# Patient Record
Sex: Male | Born: 1975 | Race: White | Hispanic: No | Marital: Married | Smoking: Never smoker
Health system: Southern US, Community
[De-identification: ages and names within clinical notes are randomized; demographics above are authoritative.]

## PROBLEM LIST (undated history)

## (undated) DIAGNOSIS — K2270 Barrett's esophagus without dysplasia: Secondary | ICD-10-CM

## (undated) DIAGNOSIS — T7840XA Allergy, unspecified, initial encounter: Secondary | ICD-10-CM

## (undated) DIAGNOSIS — C20 Malignant neoplasm of rectum: Secondary | ICD-10-CM

## (undated) HISTORY — DX: Barrett's esophagus without dysplasia: K22.70

## (undated) HISTORY — DX: Allergy, unspecified, initial encounter: T78.40XA

## (undated) HISTORY — PX: COLONOSCOPY WITH ESOPHAGOGASTRODUODENOSCOPY (EGD): SHX5779

---

## 2011-08-10 HISTORY — PX: FINGER SURGERY: SHX640

## 2016-09-27 ENCOUNTER — Encounter (HOSPITAL_COMMUNITY): Payer: Self-pay | Admitting: Emergency Medicine

## 2016-09-27 ENCOUNTER — Ambulatory Visit (HOSPITAL_COMMUNITY)
Admission: EM | Admit: 2016-09-27 | Discharge: 2016-09-27 | Disposition: A | Discharge status: Home / Self Care | Payer: PRIVATE HEALTH INSURANCE | Attending: Internal Medicine | Admitting: Internal Medicine

## 2016-09-27 ENCOUNTER — Ambulatory Visit (INDEPENDENT_AMBULATORY_CARE_PROVIDER_SITE_OTHER): Payer: PRIVATE HEALTH INSURANCE

## 2016-09-27 DIAGNOSIS — M1712 Unilateral primary osteoarthritis, left knee: Secondary | ICD-10-CM

## 2016-09-27 NOTE — ED Provider Notes (Signed)
[place]    CSN: [id number] Arrival date & time: 09/27/16  1018     History   Chief Complaint Chief Complaint  Patient presents with  . Knee Pain    HPI Jeffrey Day. is a 41 y.o. male. He is a heavy Emergency planning/management officer, and plays recreational softball. He presents today with a gradual onset over several weeks of left knee discomfort. He has some discomfort with activity, and actually stopped playing softball in November in an effort to let the knee rest. He also has stiffness if he's been sitting still, on the couch or driving, takes a few minutes to limber up. No fever, no rashes, no malaise. No other joint difficulties. 2-3 days a week he takes 4 tablets of Advil over-the-counter in the morning, seems to help some. A knee sleeve has not been helpful. He has not had this symptom in the past. He has no family history of arthritis. He moved here about 18 months ago, and his platelet a lot more softball since then, most weekends was playing 3-8 games until a couple months ago.    HPI  History reviewed. No pertinent past medical history.   Past Surgical History:  Procedure Laterality Date  . FINGER SURGERY         Home Medications   advil  Family History History reviewed. No pertinent family history.  Social History Social History  Substance Use Topics  . Smoking status: Never Smoker  . Smokeless tobacco: Never Used  . Alcohol use Yes     Allergies   Penicillins   Review of Systems Review of Systems  All other systems reviewed and are negative.    Physical Exam Triage Vital Signs ED Triage Vitals [09/27/16 1104]  Enc Vitals Group     BP 137/79     Pulse Rate 95     Resp 16     Temp 98.6 F (37 C)     Temp Source Oral     SpO2 99 %     Weight      Height      Pain Score 4   Updated Vital Signs BP 137/79 (BP Location: Right Arm)   Pulse 95   Temp 98.6 F (37 C) (Oral)   Resp 16   SpO2 99%   Physical Exam    Constitutional: He is oriented to person, place, and time. No distress.  Alert, nicely groomed  HENT:  Head: Atraumatic.  Eyes:  Conjugate gaze, no eye redness/drainage  Neck: Neck supple.  Cardiovascular: Normal rate.   Pulmonary/Chest: No respiratory distress.  Lungs clear, symmetric breath sounds  Abdominal: He exhibits no distension.  Musculoskeletal: Normal range of motion.  Both knees are examined side-by-side, roughly symmetric but may be a faint suggestion of swelling in the upper medial aspect of the left knee compared to the right. Symmetric range of motion, able to fully flex and fully extend the left knee. No focal tenderness to palpation, no temperature difference. No effusion noted. No erythema or bruising  Neurological: He is alert and oriented to person, place, and time.  Skin: Skin is warm and dry.  No cyanosis  Nursing note and vitals reviewed.    UC Treatments / Results   Radiology Dg Knee Ap/lat W/sunrise Left  Result Date: 09/27/2016 CLINICAL DATA:  Left knee pain for 1 month.  No known injury. EXAM: LEFT KNEE 3 VIEWS COMPARISON:  None. FINDINGS: No evidence of fracture, dislocation, or joint effusion. Enthesopathic  changes are seen along the superior margin of patella. No other osseous abnormality identified. IMPRESSION: No acute findings.  Patellar enthesopathy noted. Electronically Signed   By: Earle Gell M.D.   On: 09/27/2016 12:52    Procedures Procedures (including critical care time) None today   Final Clinical Impressions(s) / UC Diagnoses   Final diagnoses:  Osteoarthritis of left knee, unspecified osteoarthritis type   Avoid activities that increase knee pain/stiffness dramatically.  Continue ibuprofen as you are.  Followup with orthopedics.  Xray just suggested some mild wear and tear changes.    Sherlene Shams, MD 09/28/16 [phone]

## 2016-09-27 NOTE — Discharge Instructions (Addendum)
Avoid activities that increase knee pain/stiffness dramatically.  Continue ibuprofen as you are.  Followup with orthopedics.  Xray just suggested some mild wear and tear changes.

## 2016-09-27 NOTE — ED Triage Notes (Signed)
The patient presented to the [facility] with a complaint of left knee pain x 2 months. The patient did not recall a specific injury but did advised that he has a lot of "wear and tear" playing softball.

## 2016-10-07 ENCOUNTER — Other Ambulatory Visit: Payer: Self-pay | Admitting: Orthopedic Surgery

## 2016-10-07 DIAGNOSIS — R531 Weakness: Secondary | ICD-10-CM

## 2016-10-07 DIAGNOSIS — R52 Pain, unspecified: Secondary | ICD-10-CM

## 2016-10-07 DIAGNOSIS — Z77018 Contact with and (suspected) exposure to other hazardous metals: Secondary | ICD-10-CM

## 2016-10-08 ENCOUNTER — Ambulatory Visit
Admission: RE | Admit: 2016-10-08 | Discharge: 2016-10-08 | Disposition: A | Discharge status: Home / Self Care | Payer: PRIVATE HEALTH INSURANCE | Source: Ambulatory Visit | Attending: Orthopedic Surgery | Admitting: Orthopedic Surgery

## 2016-10-08 DIAGNOSIS — Z77018 Contact with and (suspected) exposure to other hazardous metals: Secondary | ICD-10-CM

## 2016-10-08 DIAGNOSIS — R52 Pain, unspecified: Secondary | ICD-10-CM

## 2016-10-08 DIAGNOSIS — R531 Weakness: Secondary | ICD-10-CM

## 2017-06-03 ENCOUNTER — Ambulatory Visit: Payer: Self-pay | Admitting: Family Medicine

## 2017-09-26 ENCOUNTER — Ambulatory Visit: Payer: Self-pay | Admitting: Family Medicine

## 2018-12-28 ENCOUNTER — Encounter: Payer: Self-pay | Admitting: Family Medicine

## 2018-12-28 ENCOUNTER — Ambulatory Visit (INDEPENDENT_AMBULATORY_CARE_PROVIDER_SITE_OTHER): Payer: BLUE CROSS/BLUE SHIELD | Admitting: Family Medicine

## 2018-12-28 DIAGNOSIS — K5900 Constipation, unspecified: Secondary | ICD-10-CM | POA: Diagnosis not present

## 2018-12-28 NOTE — Progress Notes (Signed)
Chief Complaint:  Jeffrey Day. is a 43 y.o. male who presents today for a virtual office visit with a chief complaint of constipation.   Assessment/Plan:  Constipation No red flags.  Will start bowel regimen with MiraLAX and senna for the next 1-2 weeks.  Afterwards, will continue MiraLAX daily for several more weeks, then as needed daily to have soft bowel movement.  If does not prove with above, would consider Amitza versus Linzess.  Discussed reasons to return to care.  Follow-up as needed.  Preventative Healthcare Patient was instructed to return soon for CPE. Health Maintenance Due  Topic Date Due  . HIV Screening  05[birth year]1990     Subjective:  HPI:  Constipation Started several months ago.  Worsened within the last few weeks.  Has sensation that he needs to go to the restroom however when he has a bowel movement he will he has a small amount of loose stool.  Has to strain to have a bowel movement.  No nausea or vomiting.  No fevers or chills.  Has tried ducolax which helps however only last for a day or so.  Has had some decreased appetite.  No melena or hematochezia.  No abdominal pain.  No other obvious alleviating or aggravating factors.   ROS: Per HPI, otherwise a complete review of systems was negative.   PMH:  The following were reviewed and entered/updated in epic: History reviewed. No pertinent past medical history. There are no active problems to display for this patient.  Past Surgical History:  Procedure Laterality Date  . 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 Lung cancer Paternal Grandmother   . Hypertension Neg Hx   . Heart disease Neg Hx   . Stroke Neg Hx     Medications- reviewed and updated No current outpatient medications on file.   No current facility-administered medications for this visit.     Allergies-reviewed and updated Allergies  Allergen Reactions  .  Penicillins Other (See Comments)    Social History   Socioeconomic History  . Marital status: Married    Spouse name: Not on file  . Number of children: Not on file  . Years of education: Not on file  . Highest education level: Not on file  Occupational History  . Not on file  Social Needs  . Financial resource strain: Not on file  . Food insecurity:    Worry: Not on file    Inability: Not on file  . Transportation needs:    Medical: Not on file    Non-medical: Not on file  Tobacco Use  . Smoking status: Never Smoker  . Smokeless tobacco: Never Used  Substance and Sexual Activity  . Alcohol use: Yes  . Drug use: No  . Sexual activity: Not on file  Lifestyle  . Physical activity:    Days per week: Not on file    Minutes per session: Not on file  . Stress: Not on file  Relationships  . Social connections:    Talks on phone: Not on file    Gets together: Not on file    Attends religious service: Not on file    Active member of club or organization: Not on file    Attends meetings of clubs or organizations: Not on file    Relationship status: Not on file  Other Topics Concern  . Not on file  Social History Narrative  . Not on file         Objective/Observations  Physical Exam: Gen: NAD, resting comfortably Eyes: EOMI. no scleral icterus Cardiovascular: No peripheral edema Pulm: Normal work of breathing Skin: No rashes or lesions MSK: Upper extremities with full range of motion.  No digital cyanosis. Neuro: Grossly normal, moves all extremities Psych: Normal affect and thought content  Virtual Visit via Video   I connected with Michaell Cowing. on 12/28/18 at  3:40 PM EDT by a video enabled telemedicine application and verified that I am speaking with the correct person using two identifiers. I discussed the limitations of evaluation and management by telemedicine and the availability of in person appointments. The patient expressed understanding and  agreed to proceed.   Patient location: Home Provider location: [place] participating in the virtual visit: Myself and Patient     Algis Greenhouse. Jerline Pain, MD 12/28/2018 4:06 PM

## 2019-01-18 ENCOUNTER — Ambulatory Visit (INDEPENDENT_AMBULATORY_CARE_PROVIDER_SITE_OTHER): Payer: BC Managed Care – PPO | Admitting: Family Medicine

## 2019-01-18 ENCOUNTER — Encounter: Payer: Self-pay | Admitting: Family Medicine

## 2019-01-18 VITALS — Wt 180.0 lb

## 2019-01-18 DIAGNOSIS — K625 Hemorrhage of anus and rectum: Secondary | ICD-10-CM

## 2019-01-18 DIAGNOSIS — K5900 Constipation, unspecified: Secondary | ICD-10-CM

## 2019-01-18 NOTE — Progress Notes (Signed)
    Chief Complaint:  Jeffrey Eisen. is a 43 y.o. male who presents today for a virtual office visit with a chief complaint of abdominal pain.   Assessment/Plan:  Abdominal pain/constipation/rectal bleeding Symptoms have not improved with MiraLAX.  Given his degree of abdominal pain as well as rectal bleeding, will place referral to GI for further evaluation.  He will continue using MiraLAX in the meantime. Discussed reasons to return to care and seek emergent care.     Subjective:  HPI:  Constipation / Abdominal Pain Patient had virtual visit for this about 2 weeks ago. He was recommended to start a bowel regimen with miralax and senna.  Symptoms improved for a few days however started having worsening pain with the senna and then stopped taking it.  He has been continuing with MiraLAX and overall symptoms have been stable however a few days ago had significantly worsening lower abdominal pain that lasted pretty much all day.  He has also had several episodes of bright red blood per rectum over that time as well.  He will have the sudden urge to have a bowel movement however when he goes to the restroom only a small amount comes out.  ROS: Per HPI  PMH: He reports that he has never smoked. He has never used smokeless tobacco. He reports current alcohol use. He reports that he does not use drugs.      Objective/Observations  Physical Exam: Gen: NAD, resting comfortably Pulm: Normal work of breathing Neuro: Grossly normal, moves all extremities Psych: Normal affect and thought content  Virtual Visit via Video   I connected with Jeffrey Day. on 01/18/19 at  3:40 PM EDT by a video enabled telemedicine application and verified that I am speaking with the correct person using two identifiers. I discussed the limitations of evaluation and management by telemedicine and the availability of in person appointments. The patient expressed understanding and agreed to  proceed.   Patient location: Home Provider location: [place] participating in the virtual visit: Myself and Patient     Jeffrey Day. Jerline Pain, MD 01/18/2019 3:36 PM

## 2019-01-19 ENCOUNTER — Encounter: Payer: Self-pay | Admitting: Gastroenterology

## 2019-02-02 ENCOUNTER — Encounter: Payer: Self-pay | Admitting: Gastroenterology

## 2019-02-02 ENCOUNTER — Ambulatory Visit (INDEPENDENT_AMBULATORY_CARE_PROVIDER_SITE_OTHER): Payer: BC Managed Care – PPO | Admitting: Gastroenterology

## 2019-02-02 ENCOUNTER — Telehealth: Payer: Self-pay | Admitting: Gastroenterology

## 2019-02-02 DIAGNOSIS — R1032 Left lower quadrant pain: Secondary | ICD-10-CM | POA: Diagnosis not present

## 2019-02-02 DIAGNOSIS — R194 Change in bowel habit: Secondary | ICD-10-CM

## 2019-02-02 DIAGNOSIS — K5900 Constipation, unspecified: Secondary | ICD-10-CM

## 2019-02-02 DIAGNOSIS — R630 Anorexia: Secondary | ICD-10-CM

## 2019-02-02 DIAGNOSIS — R634 Abnormal weight loss: Secondary | ICD-10-CM

## 2019-02-02 DIAGNOSIS — K625 Hemorrhage of anus and rectum: Secondary | ICD-10-CM

## 2019-02-02 DIAGNOSIS — R6881 Early satiety: Secondary | ICD-10-CM

## 2019-02-02 MED ORDER — PEG 3350-KCL-NA BICARB-NACL 420 G PO SOLR: 4000.0000 mL | Freq: Once | ORAL | 0 refills | Status: AC

## 2019-02-02 NOTE — Progress Notes (Signed)
[facility] VISIT   Primary Care Provider Vivi Barrack, [address] [place] [address] [phone]  Referring Provider Vivi Barrack, [address] [place],  [place] [address] [phone]  Patient Profile: Jeffrey Day. is a 43 y.o. male without a significant PMH.  The patient presents to the [facility] for an evaluation and management of problem(s) noted below:  Problem List 1. Rectal bleeding   2. LLQ pain   3. Unintentional weight loss   4. Constipation, unspecified constipation type   5. Change in bowel habits   6. Early satiety   7. Anorexia     I connected with  Michaell Cowing. on 02/02/19. I verified that I was speaking with the correct person using two identifiers. Due to the COVID-19 Pandemic, this service was provided via telemedicine using [employer]. The patient was located at home. The provider was located in the office. The patient did consent to this visit and is aware of charges through their insurance as well as the limitations of evaluation and management by telemedicine. The patient was referred by PCP. Other persons participating in this telemedicine service were none. Time spent on visit was greater than 45 minutes   History of Present Illness This is the patient's first visit to the outpatient of our GI clinic.  He describes over the course of the last year that he has been having on/off GI symptoms.  Over the course the last year he is noted a change in the caliber of his stools to become smaller in diameter.  Over the last 3 to 4 months he has noted bleeding per rectum.  This does not occur on a daily basis but is becoming an issue as he is now developing rectal discomfort and at times fecal urgency.  He has tried MiraLAX and senna and began to have issues of abdominal discomfort with the senna and so has stayed on MiraLAX once daily.  He has discomfort in the  left lower quadrant and rectum.  The last 2 months of this has been much more of a difficult time period for him due to the pain.  The patient has had to wake up in the middle the night as a result of the pain in his rectum.  Unfortunately over the course the last 6 months he has lost 30 pounds unintentionally.  He has had decreased appetite as well as early satiety.  He denies any pyrosis or dysphagia.  He has no nausea or vomiting.  He denies any fevers or chills.  The patient has not had a prior endoscopy or colonoscopy.  The patient does not normally follow-up with a provider unless there are significant issues and he is very concerned at this point.  GI Review of Systems Positive as above Negative for odynophagia, coffee-ground emesis, hematemesis, melena  Review of Systems General: Denies fevers/chills HEENT: Denies oral lesions Cardiovascular: Denies chest pain Pulmonary: Denies shortness of breath Gastroenterological: See HPI Genitourinary: Denies darkened urine or hematuria Hematological: Denies easy bruising/bleeding Endocrine: Denies temperature intolerance Dermatological: Denies jaundice Psychological: Mood is stable   Medications Current Outpatient Medications  Medication Sig Dispense Refill   Polyethylene Glycol 3350 (MIRALAX PO) Take 17 g by mouth daily.     No current facility-administered medications for this visit.     Allergies Allergies  Allergen Reactions   Penicillins Other (See Comments)    Histories History reviewed. No pertinent past medical history. Past Surgical History:  Procedure Laterality Date   FINGER SURGERY  2013   Traumatic Amputation    Social History   Socioeconomic History   Marital status: Married    Spouse name: Not on file   Number of children: Not on file   Years of education: Not on file   Highest education level: Not on file  Occupational History   Not on file  Social Needs   Financial resource strain: Not on file    Food insecurity    Worry: Not on file    Inability: Not on file   Transportation needs    Medical: Not on file    Non-medical: Not on file  Tobacco Use   Smoking status: Never Smoker   Smokeless tobacco: Never Used  Substance and Sexual Activity   Alcohol use: Yes   Drug use: No   Sexual activity: Not on file  Lifestyle   Physical activity    Days per week: Not on file    Minutes per session: Not on file   Stress: Not on file  Relationships   Social connections    Talks on phone: Not on file    Gets together: Not on file    Attends religious service: Not on file    Active member of club or organization: Not on file    Attends meetings of clubs or organizations: Not on file    Relationship status: Not on file   Intimate partner violence    Fear of current or ex partner: Not on file    Emotionally abused: Not on file    Physically abused: Not on file    Forced sexual activity: Not on file  Other Topics Concern   Not on file  Social History Narrative   Not on file   Family History  Problem Relation Age of Onset   Lung cancer Paternal Grandmother    Hypertension Neg Hx    Heart disease Neg Hx    Stroke Neg Hx    Colon cancer Neg Hx    Esophageal cancer Neg Hx    Colitis Neg Hx    Inflammatory bowel disease Neg Hx    Liver disease Neg Hx    Pancreatic cancer Neg Hx    Rectal cancer Neg Hx    Stomach cancer Neg Hx    I have reviewed his medical, social, and family history in detail and updated the electronic medical record as necessary.    PHYSICAL EXAMINATION  Telehealth Visit   REVIEW OF DATA  I reviewed the following data at the time of this encounter:  GI Procedures and Studies  No relevant studies to review  Laboratory Studies  Reviewed those in EPIC  Imaging Studies  No relevant imaging studies to review   ASSESSMENT  Mr. Busche is a 43 y.o. male without a significant PMH.  The patient is seen today for evaluation  and management of:  1. Rectal bleeding   2. LLQ pain   3. Unintentional weight loss   4. Constipation, unspecified constipation type   5. Change in bowel habits   6. Early satiety   7. Anorexia    The patient is hemodynamically stable.  However, the patient's rectal bleeding, unintentional weight loss, change in bowel habits, other systemic symptoms do concern me about the possibility of an underlying malignant process.  The patient is also concerned.  We are going to focus on MiraLAX usage at this point in time until we get him to his colonoscopy.  He will benefit from an upper endoscopy evaluation as well.  If his upper and lower endoscopic evaluations are unremarkable, then in the setting of his unintentional weight loss then we will need to perform a cross-sectional imaging of his CT chest/abdomen/pelvis.  The risks and benefits of endoscopic evaluation were discussed with the patient; these include but are not limited to the risk of perforation, infection, bleeding, missed lesions, lack of diagnosis, severe illness requiring hospitalization, as well as anesthesia and sedation related illnesses.  The patient is agreeable to proceed.  All patient questions were answered, to the best of my ability, and the patient agrees to the aforementioned plan of action with follow-up as indicated.    PLAN  Laboratories as outlined below Diagnostic colonoscopy/endoscopy Consider cross-sectional CT chest abdomen pelvis for further work-up/evaluation of unintentional weight loss if negative endoscopic evaluation Based on laboratories if patient is iron deficient he will require IV iron and oral iron supplementation Increase H2O intake to 80 to 100 ounces per day MiraLAX 1-2 times daily If no bowel movement or still having issues of constipation may use milk of magnesia or may use bisacodyl   Orders Placed This Encounter  Procedures   CBC   Comp Met (CMET)   Lipase   TSH   IBC + Ferritin    Calcium, ionized   Ambulatory referral to Gastroenterology    New Prescriptions   No medications on file   Modified Medications   No medications on file    Planned Follow Up No follow-ups on file.   Justice Britain, MD [facility] Advanced [facility]oscopy Office # [phone]

## 2019-02-02 NOTE — Patient Instructions (Addendum)
Increase Miralax to twice daily as needed.   Start Fibercon once daily.   Your provider has requested that you go to the basement level for lab work before leaving today. Press "B" on the elevator. The lab is located at the first door on the left as you exit the elevator.  You have been scheduled for an endoscopy and colonoscopy. Please follow the written instructions given to you at your visit today. Please pick up your prep supplies at the pharmacy within the next 1-3 days. If you use inhalers (even only as needed), please bring them with you on the day of your procedure. Your physician has requested that you go to www.startemmi.com and enter the access code given to you at your visit today. This web site gives a general overview about your procedure. However, you should still follow specific instructions given to you by our office regarding your preparation for the procedure.  Thank you for choosing me and [facility].  Dr. Rush Landmark

## 2019-02-02 NOTE — Telephone Encounter (Signed)
Patient called in wanting to know if his lab orders can be sent to a [facility]? Please call and advise thanks

## 2019-02-03 DIAGNOSIS — R1032 Left lower quadrant pain: Secondary | ICD-10-CM | POA: Insufficient documentation

## 2019-02-03 DIAGNOSIS — R194 Change in bowel habit: Secondary | ICD-10-CM | POA: Insufficient documentation

## 2019-02-03 DIAGNOSIS — R634 Abnormal weight loss: Secondary | ICD-10-CM | POA: Insufficient documentation

## 2019-02-03 DIAGNOSIS — K625 Hemorrhage of anus and rectum: Secondary | ICD-10-CM | POA: Insufficient documentation

## 2019-02-03 DIAGNOSIS — R630 Anorexia: Secondary | ICD-10-CM | POA: Insufficient documentation

## 2019-02-03 DIAGNOSIS — K5900 Constipation, unspecified: Secondary | ICD-10-CM | POA: Insufficient documentation

## 2019-02-03 DIAGNOSIS — R6881 Early satiety: Secondary | ICD-10-CM | POA: Insufficient documentation

## 2019-02-05 NOTE — Telephone Encounter (Signed)
Pt advised that labs need to be done at our lab.

## 2019-02-12 ENCOUNTER — Other Ambulatory Visit (INDEPENDENT_AMBULATORY_CARE_PROVIDER_SITE_OTHER): Payer: BC Managed Care – PPO

## 2019-02-12 DIAGNOSIS — R6881 Early satiety: Secondary | ICD-10-CM

## 2019-02-12 DIAGNOSIS — R1032 Left lower quadrant pain: Secondary | ICD-10-CM

## 2019-02-12 DIAGNOSIS — R194 Change in bowel habit: Secondary | ICD-10-CM

## 2019-02-12 DIAGNOSIS — R634 Abnormal weight loss: Secondary | ICD-10-CM

## 2019-02-12 DIAGNOSIS — K625 Hemorrhage of anus and rectum: Secondary | ICD-10-CM

## 2019-02-12 DIAGNOSIS — R630 Anorexia: Secondary | ICD-10-CM

## 2019-02-12 DIAGNOSIS — K5900 Constipation, unspecified: Secondary | ICD-10-CM | POA: Diagnosis not present

## 2019-02-12 LAB — CBC
HCT: 43.1 % (ref 39.0–52.0)
Hemoglobin: 14.7 g/dL (ref 13.0–17.0)
MCHC: 34.2 g/dL (ref 30.0–36.0)
MCV: 87.3 fl (ref 78.0–100.0)
Platelets: 304 10*3/uL (ref 150.0–400.0)
RBC: 4.93 Mil/uL (ref 4.22–5.81)
RDW: 13.6 % (ref 11.5–15.5)
WBC: 6.3 10*3/uL (ref 4.0–10.5)

## 2019-02-12 LAB — COMPREHENSIVE METABOLIC PANEL
ALT: 10 U/L (ref 0–53)
AST: 10 U/L (ref 0–37)
Albumin: 4.4 g/dL (ref 3.5–5.2)
Alkaline Phosphatase: 93 U/L (ref 39–117)
BUN: 10 mg/dL (ref 6–23)
CO2: 29 mEq/L (ref 19–32)
Calcium: 9.7 mg/dL (ref 8.4–10.5)
Chloride: 100 mEq/L (ref 96–112)
Creatinine, Ser: 0.94 mg/dL (ref 0.40–1.50)
GFR: 87.29 mL/min (ref >60.00)
Glucose, Bld: 107 mg/dL — ABNORMAL HIGH (ref 70–99)
Potassium: 4 mEq/L (ref 3.5–5.1)
Sodium: 139 mEq/L (ref 135–145)
Total Bilirubin: 0.4 mg/dL (ref 0.2–1.2)
Total Protein: 7.5 g/dL (ref 6.0–8.3)

## 2019-02-12 LAB — IBC + FERRITIN
Ferritin: 90.6 ng/mL (ref 22.0–322.0)
Iron: 82 ug/dL (ref 42–165)
Saturation Ratios: 22.8 % (ref 20.0–50.0)
Transferrin: 257 mg/dL (ref 212.0–360.0)

## 2019-02-12 LAB — TSH: TSH: 1.4 u[IU]/mL (ref 0.35–4.50)

## 2019-02-12 LAB — LIPASE: Lipase: 18 U/L (ref 11.0–59.0)

## 2019-02-13 LAB — CALCIUM, IONIZED: Calcium, Ion: 5.4 mg/dL (ref 4.8–5.6)

## 2019-02-14 ENCOUNTER — Telehealth: Payer: Self-pay | Admitting: Gastroenterology

## 2019-02-14 NOTE — Telephone Encounter (Signed)

## 2019-02-14 NOTE — Telephone Encounter (Signed)
Pt responded "no" to all screening questions °

## 2019-02-15 ENCOUNTER — Other Ambulatory Visit: Payer: Self-pay

## 2019-02-15 ENCOUNTER — Ambulatory Visit (AMBULATORY_SURGERY_CENTER): Payer: BC Managed Care – PPO | Admitting: Gastroenterology

## 2019-02-15 ENCOUNTER — Encounter: Payer: Self-pay | Admitting: Gastroenterology

## 2019-02-15 VITALS — BP 126/77 | HR 72 | Temp 98.9°F | Resp 16 | Ht 68.0 in | Wt 180.0 lb

## 2019-02-15 DIAGNOSIS — R1032 Left lower quadrant pain: Secondary | ICD-10-CM

## 2019-02-15 DIAGNOSIS — K6289 Other specified diseases of anus and rectum: Secondary | ICD-10-CM

## 2019-02-15 DIAGNOSIS — K625 Hemorrhage of anus and rectum: Secondary | ICD-10-CM | POA: Diagnosis not present

## 2019-02-15 DIAGNOSIS — K449 Diaphragmatic hernia without obstruction or gangrene: Secondary | ICD-10-CM | POA: Diagnosis not present

## 2019-02-15 DIAGNOSIS — K2950 Unspecified chronic gastritis without bleeding: Secondary | ICD-10-CM | POA: Diagnosis not present

## 2019-02-15 DIAGNOSIS — K2270 Barrett's esophagus without dysplasia: Secondary | ICD-10-CM | POA: Diagnosis not present

## 2019-02-15 DIAGNOSIS — K210 Gastro-esophageal reflux disease with esophagitis: Secondary | ICD-10-CM | POA: Diagnosis not present

## 2019-02-15 DIAGNOSIS — K5730 Diverticulosis of large intestine without perforation or abscess without bleeding: Secondary | ICD-10-CM

## 2019-02-15 DIAGNOSIS — K2980 Duodenitis without bleeding: Secondary | ICD-10-CM

## 2019-02-15 DIAGNOSIS — C20 Malignant neoplasm of rectum: Secondary | ICD-10-CM

## 2019-02-15 DIAGNOSIS — K648 Other hemorrhoids: Secondary | ICD-10-CM

## 2019-02-15 DIAGNOSIS — K2970 Gastritis, unspecified, without bleeding: Secondary | ICD-10-CM | POA: Diagnosis not present

## 2019-02-15 DIAGNOSIS — K209 Esophagitis, unspecified without bleeding: Secondary | ICD-10-CM

## 2019-02-15 MED ORDER — SODIUM CHLORIDE 0.9 % IV SOLN: 500.0000 mL | Freq: Once | INTRAVENOUS | Status: DC

## 2019-02-15 MED ORDER — OMEPRAZOLE 40 MG PO CPDR: 40.0000 mg | DELAYED_RELEASE_CAPSULE | Freq: Two times a day (BID) | ORAL | 6 refills | Status: DC

## 2019-02-15 NOTE — Progress Notes (Signed)
Vs and admitting by Lorrin Goodell   covid-19 screen and temp N [place],LPN

## 2019-02-15 NOTE — Op Note (Addendum)
[facility] Patient Name: Jeffrey Day Procedure Date: 02/15/2019 3:07 PM MRN: [id number] Endoscopist: Justice Britain , MD Age: 43 Referring MD:  Date of Birth: 11-14-1975 Gender: Male Account #: [id number] Procedure:                Colonoscopy Indications:              Rectal bleeding, Change in bowel habits, Weight loss Medicines:                Monitored Anesthesia Care Procedure:                Pre-Anesthesia Assessment:                           - Prior to the procedure, a History and Physical                            was performed, and patient medications and                            allergies were reviewed. The patient's tolerance of                            previous anesthesia was also reviewed. The risks                            and benefits of the procedure and the sedation                            options and risks were discussed with the patient.                            All questions were answered, and informed consent                            was obtained. Prior Anticoagulants: The patient has                            taken no previous anticoagulant or antiplatelet                            agents. ASA Grade Assessment: II - A patient with                            mild systemic disease. After reviewing the risks                            and benefits, the patient was deemed in                            satisfactory condition to undergo the procedure.                           After obtaining informed consent, the colonoscope  was passed under direct vision. Throughout the                            procedure, the patient's blood pressure, pulse, and                            oxygen saturations were monitored continuously. The                            Colonoscope was introduced through the anus and                            advanced to the 5 cm into the ileum. The                            colonoscopy  was performed without difficulty. The                            patient tolerated the procedure. The quality of the                            bowel preparation was good. The terminal ileum,                            ileocecal valve, appendiceal orifice, and rectum                            were photographed. Scope In: 3:33:01 PM Scope Out: 3:54:33 PM Scope Withdrawal Time: 0 hours 18 minutes 40 seconds  Total Procedure Duration: 0 hours 21 minutes 32 seconds  Findings:                 Hemorrhoids were found on perianal exam.                           The digital rectal exam findings include palpable                            rectal mass.                           The terminal ileum and ileocecal valve appeared                            normal.                           Normal mucosa was found in the descending colon, at                            the splenic flexure, in the transverse colon, at                            the hepatic flexure, in the ascending colon and in  the cecum.                           Patchy areas of granular mucosa were found in the                            recto-sigmoid colon and in the sigmoid colon. This                            was biopsied with a cold forceps for histology to                            rule out chronic colitis, though anticipate it will                            be benign.                           A few small-mouthed diverticula were found in the                            sigmoid colon, descending colon and transverse                            colon.                           A frond-like/villous, fungating, infiltrative and                            ulcerated partially obstructing large mass was                            found extending from the dentate line into the                            proximal and mid rectum. The mass was partially                            circumferential (involving  two-thirds of the lumen                            circumference). The mass measured nine cm in length                            (0-9 cm from anal verge). Oozing was present.                            Biopsies were taken with a cold forceps for                            histology leading to more oozing.                           Non-bleeding non-thrombosed  external and internal                            hemorrhoids were found during perianal exam, during                            digital exam and during endoscopy. Complications:            No immediate complications. Estimated Blood Loss:     Estimated blood loss was minimal. Impression:               - Hemorrhoids found on perianal exam.                           - Palpable rectal mass found on digital rectal exam                            throughout the rectal vault.                           - The examined portion of the ileum was normal.                           - Normal mucosa in the descending colon, at the                            splenic flexure, in the transverse colon, at the                            hepatic flexure, in the ascending colon and in the                            cecum.                           - Granularity in the recto-sigmoid colon and in the                            sigmoid colon. Biopsied.                           - Diverticulosis in the sigmoid colon, in the                            descending colon and in the transverse colon.                           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                            Biopsied.                           -  Non-bleeding non-thrombosed external and internal                            hemorrhoids. Recommendation:           - The patient will be observed post-procedure,                            until all discharge criteria are met.                            - Discharge patient to home.                           - Patient has a contact number available for                            emergencies. The signs and symptoms of potential                            delayed complications were discussed with the                            patient. Return to normal activities tomorrow.                            Written discharge instructions were provided to the                            patient.                           - Low fiber diet.                           - Begin Miralax 1-2 times daily to keep stools soft.                           - Colace 200 mg daily.                           - Continue present medications.                           - Await pathology results.                           - When results return, anticipate moving forward                            with CT-Chest/Abdomen/Pelvis. 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 Can consider role of                            Rectal EUS if necessary in future though.                           - Referrals to Oncology/Radiation/Surgery to be                            placed as well after pathology results return.                           - Anticipate repeat colonoscopy in 1 year for                            surveillance.                           - The findings and recommendations were discussed                            with the patient.                           - The findings and recommendations were discussed                            with the patient's family. Justice Britain, MD 02/15/2019 4:17:07 PM

## 2019-02-15 NOTE — Progress Notes (Signed)
Called to room to assist during endoscopic procedure.  Patient ID and intended procedure confirmed with present staff. Received instructions for my participation in the procedure from the performing physician.  

## 2019-02-15 NOTE — Op Note (Signed)
[facility] Patient Name: Jeffrey Day Procedure Date: 02/15/2019 3:08 PM MRN: [id number] Endoscopist: Justice Britain , MD Age: 43 Referring MD:  Date of Birth: Mar 05, 1976 Gender: Male Account #: [id number] Procedure:                Upper GI endoscopy Indications:              Anorexia, Early satiety, Weight loss Medicines:                Monitored Anesthesia Care Procedure:                Pre-Anesthesia Assessment:                           - Prior to the procedure, a History and Physical                            was performed, and patient medications and                            allergies were reviewed. The patient's tolerance of                            previous anesthesia was also reviewed. The risks                            and benefits of the procedure and the sedation                            options and risks were discussed with the patient.                            All questions were answered, and informed consent                            was obtained. Prior Anticoagulants: The patient has                            taken no previous anticoagulant or antiplatelet                            agents. ASA Grade Assessment: II - A patient with                            mild systemic disease. After reviewing the risks                            and benefits, the patient was deemed in                            satisfactory condition to undergo the procedure.                           After obtaining informed consent, the endoscope was  passed under direct vision. Throughout the                            procedure, the patient's blood pressure, pulse, and                            oxygen saturations were monitored continuously. The                            Endoscope was introduced through the mouth, and                            advanced to the second part of duodenum. The upper                            GI endoscopy  was accomplished without difficulty.                            The patient tolerated the procedure. Scope In: Scope Out: Findings:                 No gross lesions were noted in the proximal                            esophagus and in the mid esophagus.                           LA Grade C (one or more mucosal breaks continuous                            between tops of 2 or more mucosal folds, less than                            75% circumference) esophagitis with no bleeding was                            found in the distal esophagus.                           The esophagus and gastroesophageal junction were                            examined with white light and narrow band imaging                            (NBI) from a forward view and retroflexed position.                            There were esophageal mucosal changes suspicious                            for long-segment Barrett's esophagus. These changes  involved the mucosa at the upper extent of the                            gastric folds (36 cm from the incisors) extending                            to the Z-line (31 cm from the incisors).                            Circumferential salmon-colored mucosa was present                            from 33 to 36 cm and three tongues of                            salmon-colored mucosa were present from 31 to 34                            cm. The maximum longitudinal extent of these                            esophageal mucosal changes was 6 cm in length. This                            entire region was biopsied with a cold forceps for                            histology and placed into a single jar.                           A small hiatal hernia was present.                           No gross lesions were noted in the entire examined                            stomach. Biopsies were taken with a cold forceps                            for histology  and Helicobacter pylori testing.                           No gross lesions were noted in the duodenal bulb,                            in the first portion of the duodenum and in the                            second portion of the duodenum. Biopsies were taken                            with a cold forceps for histology. Complications:  No immediate complications. Estimated Blood Loss:     Estimated blood loss was minimal. Impression:               - No gross lesions in proximal/middle esophagus. LA                            Grade C distal esophagitis.                           - Esophageal mucosal changes suspicious for                            long-segment Barrett's esophagus. Biopsied.                           - Small hiatal hernia.                           - No gross lesions in the stomach. Biopsied.                           - No gross lesions in the duodenal bulb, in the                            first portion of the duodenum and in the second                            portion of the duodenum. Biopsied. Recommendation:           - Proceed to scheduled colonoscopy.                           - Start Omeprazole 40 mg BID (Rx to be sent to                            pharmacy and with 6 refills).                           - Await pathology results.                           - Repeat upper endoscopy in 3 months to check                            healing.                           - Plan for true Barrett's biopsies if changes still                            persistent.                           - The findings and recommendations were discussed  with the patient.                           - The findings and recommendations were discussed                            with the patient's family. Justice Britain, MD 02/15/2019 4:06:09 PM

## 2019-02-15 NOTE — Progress Notes (Signed)
Pt Drowsy. VSS. To PACU, report to RN. No anesthetic complications noted.  

## 2019-02-15 NOTE — Patient Instructions (Addendum)
Please, eat a low fiber diet.  You have been given the handout.  Take colace 200 mg daily.  Begin miralax twice daily per Dr. Jerilynn Mages.  So not get constipated. Read all of the handouts given to to by your recovery room nurse.  Dr. Has ordered a CT scan for after biopsies.  You have the contrast, and the office will call you to schedule it.  YOU HAD AN ENDOSCOPIC PROCEDURE TODAY AT [facility]:   Refer to the procedure report that was given to you for any specific questions about what was found during the examination.  If the procedure report does not answer your questions, please call your gastroenterologist to clarify.  If you requested that your care partner not be given the details of your procedure findings, then the procedure report has been included in a sealed envelope for you to review at your convenience later.  YOU SHOULD EXPECT: Some feelings of bloating in the abdomen. Passage of more gas than usual.  Walking can help get rid of the air that was put into your GI tract during the procedure and reduce the bloating. If you had a lower endoscopy (such as a colonoscopy or flexible sigmoidoscopy) you may notice spotting of blood in your stool or on the toilet paper. If you underwent a bowel prep for your procedure, you may not have a normal bowel movement for a few days.  Please Note:  You might notice some irritation and congestion in your nose or some drainage.  This is from the oxygen used during your procedure.  There is no need for concern and it should clear up in a day or so.  SYMPTOMS TO REPORT IMMEDIATELY:   Following lower endoscopy (colonoscopy or flexible sigmoidoscopy):  Excessive amounts of blood in the stool  Significant tenderness or worsening of abdominal pains  Swelling of the abdomen that is new, acute  Fever of 100F or higher   Following upper endoscopy (EGD)  Vomiting of blood or coffee ground material  New chest pain or pain under the shoulder  blades  Painful or persistently difficult swallowing  New shortness of breath  Fever of 100F or higher  Black, tarry-looking stools  For urgent or emergent issues, a gastroenterologist can be reached at any hour by calling [phone].   DIET:  We do recommend a low fiber diet.  Drink plenty of fluids but you should avoid alcoholic beverages for 24 hours.  ACTIVITY:  You should plan to take it easy for the rest of today and you should NOT DRIVE or use heavy machinery until tomorrow (because of the sedation medicines used during the test).    FOLLOW UP: Our staff will call the number listed on your records 48-72 hours following your procedure to check on you and address any questions or concerns that you may have regarding the information given to you following your procedure. If we do not reach you, we will leave a message.  We will attempt to reach you two times.  During this call, we will ask if you have developed any symptoms of COVID 19. If you develop any symptoms (ie: fever, flu-like symptoms, shortness of breath, cough etc.) before then, please call [phone].  If you test positive for Covid 19 in the 2 weeks post procedure, please call and report this information to [place].    If any biopsies were taken you will be contacted by phone. The doctor will order future tests for further investigation.  Please call us at [phone] if you have not heard about the biopsies in 3 weeks.    SIGNATURES/CONFIDENTIALITY: You and/or your care partner have signed paperwork which will be entered into your electronic medical record.  These signatures attest to the fact that that the information above on your After Visit Summary has been reviewed and is understood.  Full responsibility of the confidentiality of this discharge information lies with you and/or your care-partner.YOU HAD AN ENDOSCOPIC PROCEDURE TODAY AT [facility]:   Refer to the procedure report that was given to you  for any specific questions about what was found during the examination.  If the procedure report does not answer your questions, please call your gastroenterologist to clarify.  If you requested that your care partner not be given the details of your procedure findings, then the procedure report has been included in a sealed envelope for you to review at your convenience later.  YOU SHOULD EXPECT: Some feelings of bloating in the abdomen. Passage of more gas than usual.  Walking can help get rid of the air that was put into your GI tract during the procedure and reduce the bloating. If you had a lower endoscopy (such as a colonoscopy or flexible sigmoidoscopy) you may notice spotting of blood in your stool or on the toilet paper. If you underwent a bowel prep for your procedure, you may not have a normal bowel movement for a few days.  Please Note:  You might notice some irritation and congestion in your nose or some drainage.  This is from the oxygen used during your procedure.  There is no need for concern and it should clear up in a day or so.  SYMPTOMS TO REPORT IMMEDIATELY:   Following lower endoscopy (colonoscopy or flexible sigmoidoscopy):  Excessive amounts of blood in the stool  Significant tenderness or worsening of abdominal pains  Swelling of the abdomen that is new, acute  Fever of 100F or higher   Following upper endoscopy (EGD)  Vomiting of blood or coffee ground material  New chest pain or pain under the shoulder blades  Painful or persistently difficult swallowing  New shortness of breath  Fever of 100F or higher  Black, tarry-looking stools  For urgent or emergent issues, a gastroenterologist can be reached at any hour by calling [phone].   DIET:  We do recommend a small meal at first, but then you may proceed to your regular diet.  Drink plenty of fluids but you should avoid alcoholic beverages for 24 hours.  ACTIVITY:  You should plan to take it easy for the  rest of today and you should NOT DRIVE or use heavy machinery until tomorrow (because of the sedation medicines used during the test).    FOLLOW UP: Our staff will call the number listed on your records 48-72 hours following your procedure to check on you and address any questions or concerns that you may have regarding the information given to you following your procedure. If we do not reach you, we will leave a message.  We will attempt to reach you two times.  During this call, we will ask if you have developed any symptoms of COVID 19. If you develop any symptoms (ie: fever, flu-like symptoms, shortness of breath, cough etc.) before then, please call [phone].  If you test positive for Covid 19 in the 2 weeks post procedure, please call and report this information to [place].    If any biopsies  were taken you will be contacted by phone or by letter within the next 1-3 weeks.  Please call us at [phone] if you have not heard about the biopsies in 3 weeks.    SIGNATURES/CONFIDENTIALITY: You and/or your care partner have signed paperwork which will be entered into your electronic medical record.  These signatures attest to the fact that that the information above on your After Visit Summary has been reviewed and is understood.  Full responsibility of the confidentiality of this discharge information lies with you and/or your care-partner.

## 2019-02-16 ENCOUNTER — Other Ambulatory Visit: Payer: Self-pay | Admitting: Radiation Oncology

## 2019-02-16 ENCOUNTER — Other Ambulatory Visit: Payer: Self-pay

## 2019-02-16 ENCOUNTER — Telehealth: Payer: Self-pay | Admitting: Gastroenterology

## 2019-02-16 ENCOUNTER — Telehealth: Payer: Self-pay

## 2019-02-16 DIAGNOSIS — K6289 Other specified diseases of anus and rectum: Secondary | ICD-10-CM

## 2019-02-16 DIAGNOSIS — C20 Malignant neoplasm of rectum: Secondary | ICD-10-CM

## 2019-02-16 NOTE — Telephone Encounter (Signed)
Reached out to Jeffrey Day and his wife Jeffrey Day [phone])  to introduce myself as the GI Nurse Navigator and advise that he has an appointment with [name] NP/Dr. Benay Spice on 02/28/19 @ 2:15. Discussed what the next few weeks will entail with radiation oncology and surgical appointments. Discussed available supports and team members Encouraged them to reach out with questions or concerns.

## 2019-02-16 NOTE — Telephone Encounter (Signed)
Patient scheduled for CT chest and CT abd/pelvis both with IV and PO contrast at [facility] 02/22/19 patient to arrive at 3:45pm. Called patient and let him know to be NPO 4 hrs before CT except for drinking contrast. 1st bottle to be drank at 2:00pm and 2nd bottle to be drank at 3:00pm. Also made referrals to Oncology/Radiation Oncology and [facility]. Patient aware he will be getting calls from them to set-up appts.

## 2019-02-16 NOTE — Telephone Encounter (Signed)
I called and spoke with the patient this afternoon. Pathology had been rushed from yesterday's colonoscopy. 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 This is consistent with what we were anticipating from the procedure yesterday. We will move forward with scheduling his CT chest/abdomen/pelvis with IV and oral contrast. We will hold on pelvic MRI due to metal in facial region. Referrals to oncology and surgery and radiation oncology will be expedited I will place our oncology navigator on here to help [place] with this as well. Would be happy to consider role of endoscopic ultrasound if no evidence of metastatic disease is present and the multidisciplinary service thinks this is necessary. We we will tentatively place his case for [place] discussion on 03/31/2019. Patient appreciative for the quick update and moving forward as quickly as we can with neck steps and evaluation.  Justice Britain, MD [facility] Office # [phone]

## 2019-02-18 ENCOUNTER — Encounter: Payer: Self-pay | Admitting: Gastroenterology

## 2019-02-19 ENCOUNTER — Telehealth: Payer: Self-pay | Admitting: Radiation Oncology

## 2019-02-19 ENCOUNTER — Telehealth: Payer: Self-pay

## 2019-02-19 NOTE — Telephone Encounter (Signed)
  Follow up Call-  Call back number 02/15/2019  Post procedure Call Back phone  # [phone]  Permission to leave phone message Yes     Patient questions:  Do you have a fever, pain , or abdominal swelling? No. Pain Score  0 *  Have you tolerated food without any problems? Yes.    Have you been able to return to your normal activities? Yes.    Do you have any questions about your discharge instructions: Diet   No. Medications  No. Follow up visit  No.  Do you have questions or concerns about your Care? No.  Actions: * If pain score is 4 or above: No action needed, pain <4.  1. Have you developed a fever since your procedure? no  2.   Have you had an respiratory symptoms (SOB or cough) since your procedure? no   3.   Have you tested positive for COVID 19 since your procedure no  4.   Have you had any family members/close contacts diagnosed with the COVID 19 since your procedure?  no   If yes to any of these questions please route to Joylene John, RN and Alphonsa Gin, Therapist, sports.

## 2019-02-19 NOTE — Telephone Encounter (Signed)
Attempted to reach pt. With follow-up call following endoscopic procedure 02/15/2019.  LM on pt. Ans. Machine.  Will try to reach pt. Again later today.

## 2019-02-19 NOTE — Telephone Encounter (Signed)
New message: ° ° °LVM for patient to return call to schedule from referral received. °

## 2019-02-20 ENCOUNTER — Telehealth: Payer: Self-pay

## 2019-02-20 ENCOUNTER — Other Ambulatory Visit: Payer: Self-pay

## 2019-02-20 DIAGNOSIS — K6289 Other specified diseases of anus and rectum: Secondary | ICD-10-CM

## 2019-02-20 DIAGNOSIS — C20 Malignant neoplasm of rectum: Secondary | ICD-10-CM | POA: Diagnosis not present

## 2019-02-20 NOTE — Telephone Encounter (Signed)
Called pt to schedule rectal EUS on 7/28 or 7/30 at [facility] with Dr Jerilynn Mages.  Left message on machine to call back

## 2019-02-20 NOTE — Telephone Encounter (Signed)
-----   Message from Irving Copas., MD sent at 02/20/2019 71:24 AM EDT ----- Jeffrey Day, no problem we should be able to get it before radiation as long as that isn't starting next week. Varian Innes, can you look towards my hospital week or the week after for me to have this patient added for a Rectal EUS (please place on Tues/Thur preference if hospital week)? Thanks. GM ----- Message ----- From: Leighton Ruff, MD Sent: [phone]/80/9983  11:04 AM EDT To: Irving Copas., MD  I will need it for surgical planning.  I need to see the plan between the tumor and his sphincters before he gets hit with radiation.  If you don't think you or Linna Hoff can get it done before he needs to start RT, just let me know and I can do it.  Thanks  Jeffrey Day   ----- Message ----- From: Irving Copas., MD Sent: 02/20/2019  38:25 AM EDT To: Leighton Ruff, MD  I said, let's see what the CT scans show first. If you all feel that based on the CT, we need, we can get set for EUS. I'll be away for the next 1.5 weeks, but I have a hospital week at the end of the month that I can get him in if that is the decision. I think he will be on GI Tumor board for 7/22. Just let me know if you all discuss him and I'm not present. Thanks. [name] ----- Message ----- From: Leighton Ruff, MD Sent: [phone]/[phone]  10:15 AM EDT To: Irving Copas., MD  [name], Is he on your radar for a rectal EUS?  Can't get an MRI due to metal in his eye.  Jeffrey Day

## 2019-02-20 NOTE — Telephone Encounter (Signed)
I spoke with the pt to set him up for the lower EUS and he states he can not follow the quarantine rule for after COVID testing prior to the procedure.  He will try to find a work replacement and call back.

## 2019-02-20 NOTE — Telephone Encounter (Signed)
The pt returned my call and agrees to have the lower EUS on 7/28.  Covid testing on 7/24. He asked for information to be sent to My Chart. I have sent as requested.

## 2019-02-20 NOTE — Telephone Encounter (Signed)
Called patient to reschedule initial consult with Dr. Benay Spice to Jeffrey Day 7/22 @ 10:45 AM.

## 2019-02-20 NOTE — Telephone Encounter (Signed)
Let's see what he says, hopefully he will be able to find a way to work it out. Please update when able. Thanks. GM

## 2019-02-22 ENCOUNTER — Ambulatory Visit (HOSPITAL_COMMUNITY)
Admission: RE | Admit: 2019-02-22 | Discharge: 2019-02-22 | Disposition: A | Discharge status: Home / Self Care | Payer: BC Managed Care – PPO | Source: Ambulatory Visit | Attending: Gastroenterology | Admitting: Gastroenterology

## 2019-02-22 ENCOUNTER — Encounter: Payer: Self-pay | Admitting: Gastroenterology

## 2019-02-22 ENCOUNTER — Other Ambulatory Visit: Payer: Self-pay

## 2019-02-22 ENCOUNTER — Telehealth: Payer: Self-pay

## 2019-02-22 DIAGNOSIS — K6289 Other specified diseases of anus and rectum: Secondary | ICD-10-CM | POA: Diagnosis not present

## 2019-02-22 DIAGNOSIS — C20 Malignant neoplasm of rectum: Secondary | ICD-10-CM | POA: Diagnosis not present

## 2019-02-22 DIAGNOSIS — R911 Solitary pulmonary nodule: Secondary | ICD-10-CM | POA: Diagnosis not present

## 2019-02-22 MED ORDER — IOHEXOL 300 MG/ML  SOLN
100.0000 mL | Freq: Once | INTRAMUSCULAR | Status: AC | PRN
  Administered 2019-02-22: 17:00:00 100 mL via INTRAVENOUS

## 2019-02-22 NOTE — Telephone Encounter (Signed)
When would you like a recall for this patient?

## 2019-02-22 NOTE — Telephone Encounter (Signed)
1 year follow up colonoscopy. Thanks. GM

## 2019-02-23 ENCOUNTER — Telehealth: Payer: Self-pay

## 2019-02-23 NOTE — Telephone Encounter (Signed)
  Oncology Nurse Navigator Documentation   Called patient to review appointments and assess for needs. Patient concerned about the financial toxicity. Explained that he will meet with a financial advocate once treatment starts. Reviewed CT scan result from 7/16. Patient reports being pleased with surgical consult.

## 2019-02-28 ENCOUNTER — Telehealth: Payer: Self-pay | Admitting: Nurse Practitioner

## 2019-02-28 ENCOUNTER — Other Ambulatory Visit: Payer: Self-pay

## 2019-02-28 ENCOUNTER — Inpatient Hospital Stay: Payer: BC Managed Care – PPO | Attending: Nurse Practitioner | Admitting: Nurse Practitioner

## 2019-02-28 ENCOUNTER — Encounter: Payer: Self-pay | Admitting: Nurse Practitioner

## 2019-02-28 ENCOUNTER — Telehealth: Payer: Self-pay

## 2019-02-28 ENCOUNTER — Ambulatory Visit: Payer: BC Managed Care – PPO | Admitting: Nurse Practitioner

## 2019-02-28 VITALS — BP 107/74 | HR 95 | Temp 98.9°F | Resp 18 | Ht 68.0 in | Wt 175.0 lb

## 2019-02-28 DIAGNOSIS — C20 Malignant neoplasm of rectum: Secondary | ICD-10-CM | POA: Diagnosis not present

## 2019-02-28 MED ORDER — LIDOCAINE-PRILOCAINE 2.5-2.5 % EX CREA: TOPICAL_CREAM | CUTANEOUS | 2 refills | Status: DC

## 2019-02-28 MED ORDER — PROCHLORPERAZINE MALEATE 10 MG PO TABS: 10.0000 mg | ORAL_TABLET | Freq: Four times a day (QID) | ORAL | 0 refills | Status: DC | PRN

## 2019-02-28 NOTE — Progress Notes (Signed)
Per Lattie Haw contacted nurse navigator [facility] for patient. She is out of the office today but stated that she would contact patient later this afternoon.

## 2019-02-28 NOTE — Telephone Encounter (Signed)
Called and spoke with patient and his wife to review appointments and answer questions about upcoming apts and treatment. Advised that nurse educator will give detailed information. They have my direct contact information for questions and concerns.

## 2019-02-28 NOTE — Telephone Encounter (Signed)
Gave avs and calendar ° °

## 2019-02-28 NOTE — Progress Notes (Addendum)
New Hematology/Oncology Consult   Requesting MD: Dr. Rush Landmark [phone]      Reason for Consult: Rectal cancer  HPI: Jeffrey Day is a 43 year old man who initially presented about 2 months ago with a change in bowel habits with frequent small stools, occasional rectal bleeding.  He was also experiencing intermittent low abdominal and rectal pain.  He was started on MiraLAX with no improvement.  He was referred to Dr. Rush Landmark and underwent a colonoscopy as well as upper endoscopy on 02/15/2019.  The colonoscopy revealed a partially obstructing oozing tumor in the distal rectum and in the mid rectum.  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  The upper endoscopy showed distal esophagitis, esophageal mucosal changes suspicious for long segment Barrett's esophagus.  Biopsy of the duodenum showed peptic duodenitis with no dysplasia or malignancy; random gastric biopsy showed mild chronic gastritis and no intestinal metaplasia dysplasia or malignancy.  Distal esophagus biopsy showed intestinal metaplasia consistent with Barrett's esophagus and no dysplasia or malignancy.     Past Medical History:  Diagnosis Date  . Allergy   :   Past Surgical History:  Procedure Laterality Date  . FINGER SURGERY  2013   Traumatic Amputation   :   Current Outpatient Medications:  .  omeprazole (PRILOSEC) 40 MG capsule, Take 1 capsule (40 mg total) by mouth 2 (two) times daily at 8 am and 10 pm., Disp: 60 capsule, Rfl: 6 .  Polyethylene Glycol 3350 (MIRALAX PO), Take 17 g by mouth daily., Disp: , Rfl:   Current Facility-Administered Medications:  .  0.9 %  sodium chloride infusion, 500 mL, Intravenous, Once, Mansouraty, Telford Nab., MD:  :   Allergies  Allergen Reactions  . Penicillins Other (See Comments)  :  FH: Possible history of thyroid cancer on his mother's side.  Mother and father both reported to be in good  health.  1 sister who is healthy.  SOCIAL HISTORY: He lives in [place].  He is married.  He has 13 son, 22 years old, who is healthy.  He is employed as a Scientist, water quality.  Smokeless tobacco use for 20 years.  Occasional EtOH intake.  He has never had a blood transfusion.  Review of Systems: He reports weight loss of approximately 30 pounds over the past 2 months.  He has early satiety.  Change in bowel habits with frequent small stools and intermittent rectal bleeding.  Intermittent low abdominal/rectal pain.  No improvement with MiraLAX.  No unusual headaches or vision change.  No fever.  No cough.  No shortness of breath.  No dysphagia.  He denies chest pain.  No leg swelling or calf pain.  No numbness or tingling in the hands or feet.  No hematuria or dysuria.  Physical Exam:  Blood pressure 107/74, pulse 95, temperature 98.9 F (37.2 C), temperature source Oral, resp. rate 18, height '5\' 8"'$  (1.727 m), weight 175 lb (79.4 kg), SpO2 100 %.  HEENT: Neck without mass.  No thrush or ulcers. Lungs: Lungs clear bilaterally. Cardiac: Regular rate and rhythm. Abdomen: Abdomen soft and nontender.  No hepatomegaly.  No mass. GU: No testicular mass. Vascular: No leg edema. Lymph nodes: Small bilateral axillary lymph nodes versus prominent fat pads.  No palpable cervical, supraclavicular or inguinal lymph nodes. Neurologic: Alert and oriented.  Motor strength 5/5. Skin: No rash.  LABS:  None   RADIOLOGY:  Ct Chest W Contrast  Result Date:  02/22/2019 CLINICAL DATA:  Patient with recent diagnosis of rectal carcinoma. Evaluate for metastasis. EXAM: CT CHEST, ABDOMEN, AND PELVIS WITH CONTRAST TECHNIQUE: Multidetector CT imaging of the chest, abdomen and pelvis was performed following the standard protocol during bolus administration of intravenous contrast. CONTRAST:  166m OMNIPAQUE IOHEXOL 300 MG/ML  SOLN COMPARISON:  None. FINDINGS: CT CHEST FINDINGS Cardiovascular:  Normal heart size. Trace fluid superior pericardial recess. Aorta and main pulmonary artery are normal in caliber. Mediastinum/Nodes: No enlarged axillary, mediastinal or hilar lymphadenopathy. Normal appearance of the esophagus. Lungs/Pleura: Central airways are patent. No large area pulmonary consolidation. No pleural effusion or pneumothorax. There is a 2 mm nodule within the superior segment left lower lobe (image 64; series 5). Musculoskeletal: Thoracic spine degenerative changes. No aggressive or acute appearing osseous lesions. CT ABDOMEN PELVIS FINDINGS Hepatobiliary: The liver is normal in size and contour. No focal hepatic lesion is identified. Gallbladder is unremarkable. No intrahepatic or extrahepatic biliary ductal dilatation. Pancreas: Unremarkable Spleen: Unremarkable Adrenals/Urinary Tract: Normal adrenal glands. Kidneys enhance symmetrically with contrast. No hydronephrosis. Urinary bladder is unremarkable. Stomach/Bowel: There is wall thickening of the rectum. No evidence for upstream bowel obstruction. No pericolic fat stranding. Normal appendix. Oral contrast material within the small bowel. There is a small bowel to small bowel intussusception within the left upper quadrant (image 68; series 6) which appears to partially resolve on the delayed images (image 14; series 101). No free fluid or free intraperitoneal air. Normal morphology of the stomach. Vascular/Lymphatic: Normal caliber abdominal aorta. No retroperitoneal lymphadenopathy.There are a few subcentimeter perirectal lymph nodes measuring up to 6 mm (image 112; series 3). Reproductive: Heterogeneous prostate. Other: None. Musculoskeletal: Bilateral L5 pars defects with grade 1 anterolisthesis of L5 on S1. No aggressive or acute appearing osseous lesions. IMPRESSION: There is wall thickening of the rectum compatible with history of recently diagnosed rectal carcinoma. There are a few subcentimeter perirectal lymph nodes which are  nonspecific. Metastatic disease to this location is not excluded. There is a small bowel to small bowel intussusception within the jejunum which appears to partially resolve on delayed imaging. These are usually transient in etiology. Given history of malignancy, the possibility of a malignant process as causative etiology is not entirely excluded. Consider short-term follow-up exam to assess for resolution. Otherwise no evidence for distant metastasis within the chest, abdomen or pelvis. There is a 2 mm nonspecific pulmonary nodule superior segment left lower lobe. Recommend attention on follow-up. Electronically Signed   By: DLovey NewcomerM.D.   On: 02/22/2019 17:27   Ct Abdomen Pelvis W Contrast  Result Date: 02/22/2019 CLINICAL DATA:  Patient with recent diagnosis of rectal carcinoma. Evaluate for metastasis. EXAM: CT CHEST, ABDOMEN, AND PELVIS WITH CONTRAST TECHNIQUE: Multidetector CT imaging of the chest, abdomen and pelvis was performed following the standard protocol during bolus administration of intravenous contrast. CONTRAST:  1073mOMNIPAQUE IOHEXOL 300 MG/ML  SOLN COMPARISON:  None. FINDINGS: CT CHEST FINDINGS Cardiovascular: Normal heart size. Trace fluid superior pericardial recess. Aorta and main pulmonary artery are normal in caliber. Mediastinum/Nodes: No enlarged axillary, mediastinal or hilar lymphadenopathy. Normal appearance of the esophagus. Lungs/Pleura: Central airways are patent. No large area pulmonary consolidation. No pleural effusion or pneumothorax. There is a 2 mm nodule within the superior segment left lower lobe (image 64; series 5). Musculoskeletal: Thoracic spine degenerative changes. No aggressive or acute appearing osseous lesions. CT ABDOMEN PELVIS FINDINGS Hepatobiliary: The liver is normal in size and contour. No focal hepatic lesion is identified. Gallbladder is  unremarkable. No intrahepatic or extrahepatic biliary ductal dilatation. Pancreas: Unremarkable Spleen:  Unremarkable Adrenals/Urinary Tract: Normal adrenal glands. Kidneys enhance symmetrically with contrast. No hydronephrosis. Urinary bladder is unremarkable. Stomach/Bowel: There is wall thickening of the rectum. No evidence for upstream bowel obstruction. No pericolic fat stranding. Normal appendix. Oral contrast material within the small bowel. There is a small bowel to small bowel intussusception within the left upper quadrant (image 68; series 6) which appears to partially resolve on the delayed images (image 14; series 101). No free fluid or free intraperitoneal air. Normal morphology of the stomach. Vascular/Lymphatic: Normal caliber abdominal aorta. No retroperitoneal lymphadenopathy.There are a few subcentimeter perirectal lymph nodes measuring up to 6 mm (image 112; series 3). Reproductive: Heterogeneous prostate. Other: None. Musculoskeletal: Bilateral L5 pars defects with grade 1 anterolisthesis of L5 on S1. No aggressive or acute appearing osseous lesions. IMPRESSION: There is wall thickening of the rectum compatible with history of recently diagnosed rectal carcinoma. There are a few subcentimeter perirectal lymph nodes which are nonspecific. Metastatic disease to this location is not excluded. There is a small bowel to small bowel intussusception within the jejunum which appears to partially resolve on delayed imaging. These are usually transient in etiology. Given history of malignancy, the possibility of a malignant process as causative etiology is not entirely excluded. Consider short-term follow-up exam to assess for resolution. Otherwise no evidence for distant metastasis within the chest, abdomen or pelvis. There is a 2 mm nonspecific pulmonary nodule superior segment left lower lobe. Recommend attention on follow-up. Electronically Signed   By: Lovey Newcomer M.D.   On: 02/22/2019 17:27    Assessment and Plan:   1. Rectal cancer  Colonoscopy 02/15/2019- frond-like/villous, fungating,  infiltrative and ulcerated partially obstructing large mass extending from the dentate line into the distal and mid rectum.  Mass partially circumferential.  Mass measured 9 cm in length.  Biopsies showed invasive adenocarcinoma; preserved expression of the major and minor MMR proteins.    Upper endoscopy 02/15/2019-distal esophagitis, esophageal mucosal changes suspicious for long segment Barrett's esophagus.  Biopsy of the duodenum showed peptic duodenitis with no dysplasia or malignancy; random gastric biopsy showed mild chronic gastritis and no intestinal metaplasia dysplasia or malignancy. Distal esophagus biopsy showed intestinal metaplasia consistent with Barrett's esophagus and no dysplasia or malignancy.  CTs 02/22/2019- wall thickening of the rectum.  A few subcentimeter perirectal lymph nodes.  Small bowel to small bowel intussusception within the jejunum which appears to partially resolve on delayed imaging.  2 mm nonspecific pulmonary nodule superior segment left lower lobe. 2. Change in bowel habits secondary to # 08 Dec 2018 3. Rectal bleeding secondary to # 08 Dec 2018 4. Weight loss  Jeffrey Day has been diagnosed with rectal cancer.  He is scheduled for a staging lower endoscopic ultrasound next week.  His case was presented at the GI tumor conference.  The recommendation at present is for chemotherapy, total neoadjuvant approach, followed by surgery.  He will complete 8 cycles of FOLFOX chemotherapy, then radiation/Xeloda.  We reviewed potential toxicities associated with chemotherapy including bone marrow toxicity, nausea, hair loss, allergic reaction.  We reviewed potential toxicities associated with 5-fluorouracil including mouth sores, diarrhea, skin rash, increased sensitivity to sun, hand-foot syndrome, skin hyperpigmentation, hyperpigmentation of pre-existing skin lesions.  We discussed the various types of neuropathy seen with oxaliplatin.  He agrees to proceed.  He will attend a  chemotherapy education class.  He understands a Port-A-Cath is required for this regimen.  We made  a referral to Dr. Marcello Moores for Port-A-Cath placement.  Prescriptions were sent to his pharmacy for EMLA cream and Compazine.  We will obtain baseline labs on the day of the chemotherapy education class.  He will return for a follow-up appointment and cycle 1 FOLFOX on 03/15/2019.  He will contact the office in the interim with any problems.  Patient seen with Dr. Benay Spice.  60 minutes were spent face-to-face at today's visit with the majority of that time involved in counseling/coordination of care.       [name], NP 02/28/2019, 11:27 AM  This was a shared visit with [name].  Jeffrey Day was interviewed and examined.  He has been diagnosed with rectal cancer.  He appears to have clinical stage III disease based on the staging evaluation today.  He will undergo a staging endoscopic ultrasound next week.  We discussed treatment options including the rationale for neoadjuvant therapy.  I recommend total neoadjuvant therapy for treatment of potential micrometastatic disease and in an attempt to preserve sphincter function.  We reviewed potential toxicities associated with the FOLFOX regimen.  The plan is to begin FOLFOX in 2 weeks.  This will be followed by Xeloda and radiation.  His wife was present by telephone for today's visit.  He does not appear to have hereditary non-polyposis colon cancer syndrome, but his family members are at increased risk of developing colorectal cancer and should have appropriate screening.  MSI/IHC testing will be performed on the resected tumor.  A chemotherapy plan was entered today.  Julieanne Manson, MD

## 2019-03-01 ENCOUNTER — Ambulatory Visit: Payer: Self-pay | Admitting: General Surgery

## 2019-03-01 DIAGNOSIS — C20 Malignant neoplasm of rectum: Secondary | ICD-10-CM | POA: Insufficient documentation

## 2019-03-01 NOTE — Progress Notes (Signed)
START ON PATHWAY REGIMEN - Colorectal     A cycle is every 14 days:     Oxaliplatin      Leucovorin      Fluorouracil      Fluorouracil   **Always confirm dose/schedule in your pharmacy ordering system**  Patient Characteristics: Preoperative or Nonsurgical Candidate (Clinical Staging), Rectal, cT3 - cT4, cN0 or Any cT, cN+ Tumor Location: Rectal Therapeutic Status: Preoperative or Nonsurgical Candidate (Clinical Staging) AJCC T Category: Staged < 8th Ed. AJCC N Category: Staged < 8th Ed. AJCC M Category: Staged < 8th Ed. AJCC 8 Stage Grouping: Staged < 8th Ed. Intent of Therapy: Curative Intent, Discussed with Patient 

## 2019-03-02 ENCOUNTER — Other Ambulatory Visit (HOSPITAL_COMMUNITY)
Admission: RE | Admit: 2019-03-02 | Discharge: 2019-03-02 | Disposition: A | Discharge status: Home / Self Care | Payer: BC Managed Care – PPO | Source: Ambulatory Visit | Attending: Gastroenterology | Admitting: Gastroenterology

## 2019-03-02 DIAGNOSIS — Z1159 Encounter for screening for other viral diseases: Secondary | ICD-10-CM | POA: Diagnosis not present

## 2019-03-03 LAB — SARS CORONAVIRUS 2 (TAT 6-24 HRS): SARS Coronavirus 2: NEGATIVE

## 2019-03-05 ENCOUNTER — Other Ambulatory Visit (HOSPITAL_COMMUNITY)
Admission: RE | Admit: 2019-03-05 | Discharge: 2019-03-05 | Disposition: A | Discharge status: Home / Self Care | Payer: BC Managed Care – PPO | Source: Ambulatory Visit | Attending: General Surgery | Admitting: General Surgery

## 2019-03-05 ENCOUNTER — Inpatient Hospital Stay: Payer: BC Managed Care – PPO

## 2019-03-05 ENCOUNTER — Other Ambulatory Visit: Payer: Self-pay

## 2019-03-05 ENCOUNTER — Other Ambulatory Visit: Payer: BC Managed Care – PPO

## 2019-03-05 ENCOUNTER — Encounter (HOSPITAL_COMMUNITY): Payer: Self-pay | Admitting: *Deleted

## 2019-03-05 DIAGNOSIS — Z20828 Contact with and (suspected) exposure to other viral communicable diseases: Secondary | ICD-10-CM | POA: Diagnosis not present

## 2019-03-05 LAB — SARS CORONAVIRUS 2 (TAT 6-24 HRS): SARS Coronavirus 2: NEGATIVE

## 2019-03-05 NOTE — Progress Notes (Signed)
Pt denies SOB, chest pain, and being under the care of a cardiologist. Pt stated that he is under the care of Dimas Chyle, PCP. Pt denies having a stress test, echo and cardiac cath. Pt denies having an EKG and chest x ray within the last year. Pt denies recent labs. Pt made aware to stop taking Aspirin (unless otherwise advised by surgeon), vitamins, fish oil and herbal medications. Do not take any NSAIDs ie: Ibuprofen, Advil, Naproxen (Aleve), Motrin, BC and Goody Powder.  Pt denies that he and family members tested positive for COVID-19 ( pt tested 03/05/19 and reminded to quarantine).   Coronavirus Screening  Pt denies that he and family members experienced the following symptoms:  Cough yes/no: No Fever (>100.60F)  yes/no: No Runny nose yes/no: No Sore throat yes/no: No Difficulty breathing/shortness of breath  yes/no: No  Pt reminded that hospital visitation restrictions are in effect and the importance of the restrictions.   Pt verbalized understanding of all pre-op instructions.

## 2019-03-06 ENCOUNTER — Encounter (HOSPITAL_COMMUNITY): Payer: Self-pay | Admitting: *Deleted

## 2019-03-06 ENCOUNTER — Ambulatory Visit (HOSPITAL_COMMUNITY): Payer: BC Managed Care – PPO | Admitting: Certified Registered"

## 2019-03-06 ENCOUNTER — Ambulatory Visit (HOSPITAL_COMMUNITY)
Admission: RE | Admit: 2019-03-06 | Discharge: 2019-03-06 | Disposition: A | Discharge status: Home / Self Care | Payer: BC Managed Care – PPO | Attending: Gastroenterology | Admitting: Gastroenterology

## 2019-03-06 ENCOUNTER — Encounter (HOSPITAL_COMMUNITY)
Admission: RE | Disposition: A | Discharge status: Home / Self Care | Payer: Self-pay | Source: Home / Self Care | Attending: Gastroenterology

## 2019-03-06 DIAGNOSIS — K648 Other hemorrhoids: Secondary | ICD-10-CM | POA: Diagnosis not present

## 2019-03-06 DIAGNOSIS — C218 Malignant neoplasm of overlapping sites of rectum, anus and anal canal: Secondary | ICD-10-CM

## 2019-03-06 DIAGNOSIS — K6289 Other specified diseases of anus and rectum: Secondary | ICD-10-CM | POA: Diagnosis not present

## 2019-03-06 DIAGNOSIS — K5669 Other partial intestinal obstruction: Secondary | ICD-10-CM | POA: Diagnosis not present

## 2019-03-06 DIAGNOSIS — I899 Noninfective disorder of lymphatic vessels and lymph nodes, unspecified: Secondary | ICD-10-CM | POA: Diagnosis not present

## 2019-03-06 DIAGNOSIS — C20 Malignant neoplasm of rectum: Secondary | ICD-10-CM

## 2019-03-06 HISTORY — PX: EUS: SHX5427

## 2019-03-06 HISTORY — DX: Malignant neoplasm of rectum: C20

## 2019-03-06 HISTORY — PX: FLEXIBLE SIGMOIDOSCOPY: SHX5431

## 2019-03-06 SURGERY — ULTRASOUND, LOWER GI TRACT, ENDOSCOPIC
Anesthesia: Monitor Anesthesia Care

## 2019-03-06 MED ORDER — LACTATED RINGERS IV SOLN
INTRAVENOUS | Status: AC | PRN
  Administered 2019-03-06: 1000 mL via INTRAVENOUS

## 2019-03-06 MED ORDER — PROPOFOL 500 MG/50ML IV EMUL
INTRAVENOUS | Status: DC | PRN
  Administered 2019-03-06: 100 ug/kg/min via INTRAVENOUS

## 2019-03-06 MED ORDER — SODIUM CHLORIDE 0.9 % IV SOLN: INTRAVENOUS | Status: DC

## 2019-03-06 MED ORDER — PROPOFOL 10 MG/ML IV BOLUS
INTRAVENOUS | Status: DC | PRN
  Administered 2019-03-06: 10 mg via INTRAVENOUS
  Administered 2019-03-06: 20 mg via INTRAVENOUS
  Administered 2019-03-06 (×5): 10 mg via INTRAVENOUS
  Administered 2019-03-06: 20 mg via INTRAVENOUS
  Administered 2019-03-06 (×2): 10 mg via INTRAVENOUS

## 2019-03-06 MED ORDER — LIDOCAINE VISCOUS HCL 2 % MT SOLN
OROMUCOSAL | Status: AC
  Filled 2019-03-06: qty 15

## 2019-03-06 MED ORDER — LIDOCAINE VISCOUS HCL 2 % MT SOLN
OROMUCOSAL | Status: DC | PRN
  Administered 2019-03-06: 1

## 2019-03-06 NOTE — Progress Notes (Signed)
CT CHEST 02-22-2019 Epic

## 2019-03-06 NOTE — H&P (Signed)
GASTROENTEROLOGY OUTPATIENT PROCEDURE H&P NOTE   Primary Care Physician: Vivi Barrack, MD  HPI: Jeffrey Day. is a 43 y.o. male who presents for Rectal EUS.  Past Medical History:  Diagnosis Date  . Allergy   . Rectal cancer [facility])    Past Surgical History:  Procedure Laterality Date  . COLONOSCOPY WITH ESOPHAGOGASTRODUODENOSCOPY (EGD)     biopsy  . FINGER SURGERY  2013   Traumatic Amputation    Current Facility-Administered Medications  Medication Dose Route Frequency Provider Last Rate Last Dose  . lactated ringers infusion    Continuous PRN Mansouraty, Telford Nab., MD   1,000 mL at 03/06/19 0656   Allergies  Allergen Reactions  . Penicillins Other (See Comments)    Childhood allergy Did it involve swelling of the face/tongue/throat, SOB, or low BP? Unknown Did it involve sudden or severe rash/hives, skin peeling, or any reaction on the inside of your mouth or nose? Unknown Did you need to seek medical attention at a hospital or doctor's office? Unknown When did it last happen?childhood allergy If all above answers are "NO", may proceed with cephalosporin use.    Family History  Problem Relation Age of Onset  . Lung cancer Paternal Grandmother   . Heart disease Maternal Grandmother   . Hypertension Neg Hx   . Stroke Neg Hx   . Colon cancer Neg Hx   . Esophageal cancer Neg Hx   . Colitis Neg Hx   . Inflammatory bowel disease Neg Hx   . Liver disease Neg Hx   . Pancreatic cancer Neg Hx   . Rectal cancer Neg Hx   . Stomach cancer Neg Hx    Social History   Socioeconomic History  . Marital status: Married    Spouse name: Not on file  . Number of children: Not on file  . Years of education: Not on file  . Highest education level: Not on file  Occupational History  . Not on file  Social Needs  . Financial resource strain: Not on file  . Food insecurity    Worry: Not on file    Inability: Not on file  . Transportation needs   Medical: Not on file    Non-medical: Not on file  Tobacco Use  . Smoking status: Never Smoker  . Smokeless tobacco: Current User    Types: Chew  Substance and Sexual Activity  . Alcohol use: Yes    Comment: occasional  . Drug use: No  . Sexual activity: Not on file  Lifestyle  . Physical activity    Days per week: Not on file    Minutes per session: Not on file  . Stress: Not on file  Relationships  . Social Herbalist on phone: Not on file    Gets together: Not on file    Attends religious service: Not on file    Active member of club or organization: Not on file    Attends meetings of clubs or organizations: Not on file    Relationship status: Not on file  . Intimate partner violence    Fear of current or ex partner: Not on file    Emotionally abused: Not on file    Physically abused: Not on file    Forced sexual activity: Not on file  Other Topics Concern  . Not on file  Social History Narrative  . Not on file    Physical Exam: Vital signs in last 24 hours:  Temp:  [97.8 F (36.6 C)] 97.8 F (36.6 C) (07/28 0651) Resp:  [14] 14 (07/28 0651) BP: (117)/(87) 117/87 (07/28 0651) SpO2:  [100 %] 100 % (07/28 0651) Weight:  [79.8 kg] 79.8 kg (07/28 0651)   GEN: NAD EYE: Sclerae anicteric ENT: MMM CV: Nontachycardic without R/Gs  GI: Soft, NT/ND NEURO:  Alert & Oriented x 3  Lab Results: No results for input(s): WBC, HGB, HCT, PLT in the last 72 hours. BMET No results for input(s): NA, K, CL, CO2, GLUCOSE, BUN, CREATININE, CALCIUM in the last 72 hours. LFT No results for input(s): PROT, ALBUMIN, AST, ALT, ALKPHOS, BILITOT, BILIDIR, IBILI in the last 72 hours. PT/INR No results for input(s): LABPROT, INR in the last 72 hours.   Impression / Plan: This is a 43 y.o.male who presents for Rectal EUS.  The risks and benefits of endoscopic evaluation were discussed with the patient; these include but are not limited to the risk of perforation, infection,  bleeding, missed lesions, lack of diagnosis, severe illness requiring hospitalization, as well as anesthesia and sedation related illnesses.  The patient is agreeable to proceed.    Justice Britain, MD [facility] Office # [phone]

## 2019-03-06 NOTE — Anesthesia Postprocedure Evaluation (Signed)
Anesthesia Post Note  Patient: Jeffrey Day.  Procedure(s) Performed: LOWER ENDOSCOPIC ULTRASOUND (EUS) (N/A ) FLEXIBLE SIGMOIDOSCOPY (N/A )     Patient location during evaluation: PACU Anesthesia Type: MAC Level of consciousness: awake and alert Pain management: pain level controlled Vital Signs Assessment: post-procedure vital signs reviewed and stable Respiratory status: spontaneous breathing, nonlabored ventilation, respiratory function stable and patient connected to nasal cannula oxygen Cardiovascular status: stable and blood pressure returned to baseline Postop Assessment: no apparent nausea or vomiting Anesthetic complications: no    Last Vitals:  Vitals:   03/06/19 0840 03/06/19 0848  BP: (!) 143/98 (!) 145/95  Pulse: 77 68  Resp: 13 13  Temp:    SpO2: 100% 100%    Last Pain:  Vitals:   03/06/19 0848  TempSrc:   PainSc: 0-No pain                 Effie Berkshire

## 2019-03-06 NOTE — Discharge Instructions (Signed)

## 2019-03-06 NOTE — Anesthesia Preprocedure Evaluation (Addendum)
Anesthesia Evaluation  Patient identified by MRN, date of birth, ID band Patient awake    Reviewed: Allergy & Precautions, NPO status , Patient's Chart, lab work & pertinent test results  Airway Mallampati: II  TM Distance: >3 FB Neck ROM: Full    Dental  (+) Dental Advisory Given, Teeth Intact   Pulmonary neg pulmonary ROS,    Pulmonary exam normal        Cardiovascular negative cardio ROS   Rhythm:Regular Rate:Normal     Neuro/Psych negative neurological ROS  negative psych ROS   GI/Hepatic negative GI ROS, Neg liver ROS,   Endo/Other  negative endocrine ROS  Renal/GU negative Renal ROS     Musculoskeletal   Abdominal Normal abdominal exam  (+)   Peds  Hematology   Anesthesia Other Findings   Reproductive/Obstetrics                            Anesthesia Physical Anesthesia Plan  ASA: II  Anesthesia Plan: MAC   Post-op Pain Management:    Induction: Intravenous  PONV Risk Score and Plan: 1 and Propofol infusion and Ondansetron  Airway Management Planned: Nasal Cannula  Additional Equipment: None  Intra-op Plan:   Post-operative Plan:   Informed Consent: I have reviewed the patients History and Physical, chart, labs and discussed the procedure including the risks, benefits and alternatives for the proposed anesthesia with the patient or authorized representative who has indicated his/her understanding and acceptance.     Dental advisory given  Plan Discussed with: CRNA  Anesthesia Plan Comments:        Anesthesia Quick Evaluation

## 2019-03-06 NOTE — Transfer of Care (Signed)
Immediate Anesthesia Transfer of Care Note  Patient: Jeffrey Day.  Procedure(s) Performed: LOWER ENDOSCOPIC ULTRASOUND (EUS) (N/A ) FLEXIBLE SIGMOIDOSCOPY (N/A )  Patient Location: Endoscopy Unit  Anesthesia Type:MAC  Level of Consciousness: awake, alert  and oriented  Airway & Oxygen Therapy: Patient Spontanous Breathing  Post-op Assessment: Report given to RN and Post -op Vital signs reviewed and stable  Post vital signs: Reviewed and stable  Last Vitals:  Vitals Value Taken Time  BP    Temp    Pulse 79 03/06/19 0823  Resp 12 03/06/19 0823  SpO2 99 % 03/06/19 0823  Vitals shown include unvalidated device data.  Last Pain:  Vitals:   03/06/19 0651  TempSrc: Oral  PainSc: 0-No pain         Complications: No apparent anesthesia complications

## 2019-03-06 NOTE — Patient Instructions (Addendum)
YOU HAVE COMPLETED YOUR COVID TEST . PLEASE BEGIN THE QUARANTINE INSTRUCTIONS AS OUTLINED IN YOUR HANDOUT.                Jeffrey Day.   Your procedure is scheduled on: 03-08-2019   Report to [facility]  Entrance    Report to admitting at [facility] DAY OF YOUR SURGERY.    Call this number if you have problems the morning of surgery [phone]     Remember: Do not eat food or drink liquids :After Midnight. BRUSH YOUR TEETH MORNING OF SURGERY AND RINSE YOUR MOUTH OUT, NO CHEWING GUM CANDY OR MINTS.     Take these medicines the morning of surgery with A SIP OF WATER: PRILOSEC                                You may not have any metal on your body including hair pins and              piercings  Do not wear jewelry, make-up, lotions, powders or perfumes, deodorant             Do not wear nail polish.  Do not shave  48 hours prior to surgery.              Men may shave face and neck.   Do not bring valuables to the hospital. [facility].  Contacts, dentures or bridgework may not be worn into surgery.      Patients discharged the day of surgery will not be allowed to drive home. IF YOU ARE HAVING SURGERY AND GOING HOME THE SAME DAY, YOU MUST HAVE AN ADULT TO DRIVE YOU HOME AND BE WITH YOU FOR 24 HOURS. YOU MAY GO HOME BY TAXI OR UBER OR ORTHERWISE, BUT AN ADULT MUST ACCOMPANY YOU HOME AND STAY WITH YOU FOR 24 HOURS.  Name and phone number of your driver:  Special Instructions: N/A              Please read over the following fact sheets you were given: _____________________________________________________________________             [facility] - Preparing for Surgery Before surgery, you can play an important role.  Because skin is not sterile, your skin needs to be as free of germs as possible.  You can reduce the number of germs on your skin by washing  with CHG (chlorahexidine gluconate) soap before surgery.  CHG is an antiseptic cleaner which kills germs and bonds with the skin to continue killing germs even after washing. Please DO NOT use if you have an allergy to CHG or antibacterial soaps.  If your skin becomes reddened/irritated stop using the CHG and inform your nurse when you arrive at Short Stay. Do not shave (including legs and underarms) for at least 48 hours prior to the first CHG shower.  You may shave your face/neck. Please follow these instructions carefully:  1.  Shower with CHG Soap the night before surgery and the  morning of Surgery.  2.  If you choose to wash your hair, wash your hair first as usual with your  normal  shampoo.  3.  After you shampoo, rinse your hair and  body thoroughly to remove the  shampoo.                           4.  Use CHG as you would any other liquid soap.  You can apply chg directly  to the skin and wash                       Gently with a scrungie or clean washcloth.  5.  Apply the CHG Soap to your body ONLY FROM THE NECK DOWN.   Do not use on face/ open                           Wound or open sores. Avoid contact with eyes, ears mouth and genitals (private parts).                       Wash face,  Genitals (private parts) with your normal soap.             6.  Wash thoroughly, paying special attention to the area where your surgery  will be performed.  7.  Thoroughly rinse your body with warm water from the neck down.  8.  DO NOT shower/wash with your normal soap after using and rinsing off  the CHG Soap.                9.  Pat yourself dry with a clean towel.            10.  Wear clean pajamas.            11.  Place clean sheets on your bed the night of your first shower and do not  sleep with pets. Day of Surgery : Do not apply any lotions/deodorants the morning of surgery.  Please wear clean clothes to the hospital/surgery center.  FAILURE TO FOLLOW THESE INSTRUCTIONS MAY RESULT IN THE  CANCELLATION OF YOUR SURGERY PATIENT SIGNATURE_________________________________  NURSE SIGNATURE__________________________________  ________________________________________________________________________

## 2019-03-06 NOTE — Op Note (Addendum)
[facility] Patient Name: Jeffrey Day Procedure Date : 03/06/2019 MRN: [id number] Attending MD: Justice Britain , MD Date of Birth: Feb 18, 1976 CSN: [id number] Age: 43 Admit Type: Outpatient Procedure:                Lower EUS Indications:              Pre-treatment staging for anorectal carcinoma Providers:                Justice Britain, MD, Carlyn Reichert, RN, William Dalton, Technician Referring MD:             Leighton Ruff, MD, Izola Price. Michaelene Song,                            [place]. [place] Medicines:                Monitored Anesthesia Care Complications:            No immediate complications. Estimated Blood Loss:     Estimated blood loss was minimal. Procedure:                Pre-Anesthesia Assessment:                           - Prior to the procedure, a History and Physical                            was performed, and patient medications and                            allergies were reviewed. The patient's tolerance of                            previous anesthesia was also reviewed. The risks                            and benefits of the procedure and the sedation                            options and risks were discussed with the patient.                            All questions were answered, and informed consent                            was obtained. Prior Anticoagulants: The patient has                            taken no previous anticoagulant or antiplatelet                            agents except for NSAID medication. ASA Grade  Assessment: II - A patient with mild systemic                            disease. After reviewing the risks and benefits,                            the patient was deemed in satisfactory condition to                            undergo the procedure.                           After obtaining informed consent, the endoscope was        passed under direct vision. Throughout the                            procedure, the patient's blood pressure, pulse, and                            oxygen saturations were monitored continuously. The                            [id number]-[id number] ([id number]) Olympus gastroscope was                            introduced through the anus and advanced to the the                            descending colon. The [id number]-[id number] ([id number])                            Olympus Radial EUS scope was introduced through the                            anus and advanced to the the sigmoid colon for                            ultrasound. The lower EUS was accomplished without                            difficulty. The patient tolerated the procedure.                            The patient tolerated the procedure. The quality of                            the bowel preparation was adequate. Scope In: 7:44:27 AM Scope Out: 8:17:11 AM Total Procedure Duration: 0 hours 32 minutes 44 seconds  Findings:      ENDOSCOPIC FINDING: :      A frond-like/villous, fungating, infiltrative, polypoid and ulcerated       partially obstructing large mass was found in the proximal rectum and in       the mid rectum. The mass was partially circumferential (involving  two-thirds of the lumen circumference). The mass measured at least eight       cm in length (1 cm - Proximal Tumor to 9 cm - Distal Tumor). Oozing was       present.      Internal hemorrhoids were found.      Normal mucosa was found in the recto-sigmoid colon and in the sigmoid       colon.      ENDOSONOGRAPHIC FINDING: :      A hypoechoic mass was found in the rectum. The mass was encountered at 1       cm (from the anal verge). The mass was partially circumferential       (involving 70% of the lumen). The endosonographic borders were irregular       and consistent with intraluminal growth. There was sonographic evidence       suggesting breakthrough of the  muscularis propria with invasion into the       perirectal fat (Layer 5, manifested by an irregular outer surface).       There was no sonographic evidence of invasion into the internal anal       sphincter muscle.      One malignant-appearing lymph node was visualized in a peritumoral       location. It was observed beyond the posterior bowel wall during       endosonographic examination of the rectum. The node measured 10.5 mm by       8.4 mm in maximal cross-sectional diameter. The node was hypoechoic and       round in shape. The endosonographic border (margin) of the node was well       defined. Due to being peritumoral this LN was not sampled.      The internal anal sphincter was visualized endosonographically and       appeared normal.      The rectosigmoid junction and sigmoid colon were normal.      The prostate was noted on EUS imaging and there is a calcification       within it. 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                           - Internal hemorrhoids.                           - Normal mucosa in the recto-sigmoid colon and in                            the sigmoid colon.                           EUS Impression:                           -  Rectal mass was visualized endosonographically.                            This was staged T3N1.                           - One malignant-appearing lymph node was visualized                            endosonographically in a peritumoral location.                            Tissue has not been obtained. However, the                            endosonographic appearance is suspicious for                            metastatic colorectal adenocarcinoma.                           - The internal anal sphincter was visualized                             endosonographically and appeared normal.                           - Endosonographic imaging showed no sign of                            significant pathology at the rectosigmoid junction                            and in the sigmoid colon.                           - Prostate noted on EUS imaging with a                            calcification noted within it. Recommendation:           - The patient will be observed post-procedure,                            until all discharge criteria are met.                           - Discharge patient to home.                           - Patient has a contact number available for                            emergencies. The signs and symptoms of potential  delayed complications were discussed with the                            patient. Return to normal activities tomorrow.                            Written discharge instructions were provided to the                            patient.                           - Move forward with next steps in regards to                            Neoadjuvant therapy.                           - Will discuss findings with Oncologic                            Multidisciplinary team whether any additional                            urologic workup may be required.                           - The findings and recommendations were discussed                            with the patient.                           - The findings and recommendations were discussed                            with the patient's family. Procedure Code(s):        --- Professional ---                           [phone], Sigmoidoscopy, flexible; with endoscopic                            ultrasound examination Diagnosis Code(s):        --- Professional ---                           C20, Malignant neoplasm of rectum                           K56.690, Other partial intestinal obstruction                            K64.8, Other hemorrhoids                           K62.89, Other specified diseases of anus and rectum  I89.9, Noninfective disorder of lymphatic vessels                            and lymph nodes, unspecified                           C21.8, Malignant neoplasm of overlapping sites of                            rectum, anus and anal canal CPT copyright 2019 American [employer] Association. All rights reserved. The codes documented in this report are preliminary and upon coder review may  be revised to meet current compliance requirements. Justice Britain, MD 03/06/2019 8:40:25 AM Number of Addenda: 0

## 2019-03-07 ENCOUNTER — Encounter (HOSPITAL_COMMUNITY)
Admission: RE | Admit: 2019-03-07 | Discharge: 2019-03-07 | Disposition: A | Discharge status: Home / Self Care | Payer: BC Managed Care – PPO | Source: Ambulatory Visit | Attending: General Surgery | Admitting: General Surgery

## 2019-03-07 ENCOUNTER — Other Ambulatory Visit (HOSPITAL_COMMUNITY): Payer: BC Managed Care – PPO

## 2019-03-07 ENCOUNTER — Telehealth: Payer: Self-pay

## 2019-03-07 ENCOUNTER — Encounter (HOSPITAL_COMMUNITY): Payer: Self-pay

## 2019-03-07 ENCOUNTER — Other Ambulatory Visit: Payer: Self-pay

## 2019-03-07 DIAGNOSIS — Z01812 Encounter for preprocedural laboratory examination: Secondary | ICD-10-CM | POA: Diagnosis not present

## 2019-03-07 LAB — CBC
HCT: 42.1 % (ref 39.0–52.0)
Hemoglobin: 14.2 g/dL (ref 13.0–17.0)
MCH: 30.5 pg (ref 26.0–34.0)
MCHC: 33.7 g/dL (ref 30.0–36.0)
MCV: 90.5 fL (ref 80.0–100.0)
Platelets: 310 10*3/uL (ref 150–400)
RBC: 4.65 MIL/uL (ref 4.22–5.81)
RDW: 13.2 % (ref 11.5–15.5)
WBC: 9.5 10*3/uL (ref 4.0–10.5)
nRBC: 0 % (ref 0.0–0.2)

## 2019-03-07 NOTE — Telephone Encounter (Signed)
Referral made and records faxed

## 2019-03-07 NOTE — Telephone Encounter (Signed)
-----   Message from Irving Copas., MD sent at 03/07/2019  1:13 PM EDT ----- Thank Elmo Putt. I spoke with the patient this afternoon to let him know our thoughts.Nazaria Riesen, please place a referral to urology to have a consultation for evaluation of "prostate calcification." Thanks to all. GM ----- Message ----- From: Leighton Ruff, MD Sent: 5/64/3329   8:43 AM EDT To: Kyung Rudd, MD, Ladell Pier, MD, #  Thanks [name].  Probably worth urology taking a look, but I'm guessing they'll just say re-eval after RT.  Port is being placed this week.  Elmo Putt ----- Message ----- From: Irving Copas., MD Sent: 03/06/2019  12:59 PM EDT To: Kyung Rudd, MD, Leighton Ruff, MD, #  EUS looks to be showing T3N1 disease at this time. Couldn't sample lymph node since it was peritumoral. IAS is intact. However, lesion is encroaching within a cm of the dentate line and a polypoid region in almost at one of the hemorrhoids as you can see on the images. 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 Thoughts Elmo Putt, since this could have impacts for your down the road. He is ready to get started with therapy and thankful for everyone's help. Chester Holstein

## 2019-03-08 ENCOUNTER — Ambulatory Visit (HOSPITAL_COMMUNITY): Payer: BC Managed Care – PPO | Admitting: Certified Registered"

## 2019-03-08 ENCOUNTER — Encounter (HOSPITAL_COMMUNITY)
Admission: RE | Disposition: A | Discharge status: Home / Self Care | Payer: Self-pay | Source: Home / Self Care | Attending: General Surgery

## 2019-03-08 ENCOUNTER — Ambulatory Visit (HOSPITAL_COMMUNITY): Payer: BC Managed Care – PPO

## 2019-03-08 ENCOUNTER — Ambulatory Visit (HOSPITAL_COMMUNITY): Payer: BC Managed Care – PPO | Admitting: Physician Assistant

## 2019-03-08 ENCOUNTER — Ambulatory Visit (HOSPITAL_COMMUNITY)
Admission: RE | Admit: 2019-03-08 | Discharge: 2019-03-08 | Disposition: A | Discharge status: Home / Self Care | Payer: BC Managed Care – PPO | Attending: General Surgery | Admitting: General Surgery

## 2019-03-08 ENCOUNTER — Encounter (HOSPITAL_COMMUNITY): Payer: Self-pay | Admitting: *Deleted

## 2019-03-08 ENCOUNTER — Ambulatory Visit: Payer: BC Managed Care – PPO | Admitting: Radiation Oncology

## 2019-03-08 DIAGNOSIS — Z95828 Presence of other vascular implants and grafts: Secondary | ICD-10-CM

## 2019-03-08 DIAGNOSIS — K625 Hemorrhage of anus and rectum: Secondary | ICD-10-CM | POA: Diagnosis not present

## 2019-03-08 DIAGNOSIS — C20 Malignant neoplasm of rectum: Secondary | ICD-10-CM | POA: Insufficient documentation

## 2019-03-08 DIAGNOSIS — Z452 Encounter for adjustment and management of vascular access device: Secondary | ICD-10-CM | POA: Diagnosis not present

## 2019-03-08 DIAGNOSIS — R630 Anorexia: Secondary | ICD-10-CM | POA: Diagnosis not present

## 2019-03-08 DIAGNOSIS — R194 Change in bowel habit: Secondary | ICD-10-CM | POA: Diagnosis not present

## 2019-03-08 DIAGNOSIS — Z79899 Other long term (current) drug therapy: Secondary | ICD-10-CM | POA: Diagnosis not present

## 2019-03-08 HISTORY — PX: PORTACATH PLACEMENT: SHX2246

## 2019-03-08 SURGERY — INSERTION, TUNNELED CENTRAL VENOUS DEVICE, WITH PORT
Anesthesia: General

## 2019-03-08 MED ORDER — GLYCOPYRROLATE PF 0.2 MG/ML IJ SOSY
PREFILLED_SYRINGE | INTRAMUSCULAR | Status: DC | PRN
  Administered 2019-03-08 (×2): .1 mg via INTRAVENOUS

## 2019-03-08 MED ORDER — BUPIVACAINE-EPINEPHRINE (PF) 0.25% -1:200000 IJ SOLN
INTRAMUSCULAR | Status: AC
  Filled 2019-03-08: qty 30

## 2019-03-08 MED ORDER — ACETAMINOPHEN 500 MG PO TABS
1000.0000 mg | ORAL_TABLET | ORAL | Status: AC
  Administered 2019-03-08: 1000 mg via ORAL
  Filled 2019-03-08: qty 2

## 2019-03-08 MED ORDER — MIDAZOLAM HCL 5 MG/5ML IJ SOLN
INTRAMUSCULAR | Status: DC | PRN
  Administered 2019-03-08: 2 mg via INTRAVENOUS

## 2019-03-08 MED ORDER — CLINDAMYCIN PHOSPHATE 900 MG/50ML IV SOLN
900.0000 mg | INTRAVENOUS | Status: AC
  Administered 2019-03-08: 900 mg via INTRAVENOUS
  Filled 2019-03-08: qty 50

## 2019-03-08 MED ORDER — PROPOFOL 10 MG/ML IV BOLUS
INTRAVENOUS | Status: AC
  Filled 2019-03-08: qty 20

## 2019-03-08 MED ORDER — SODIUM CHLORIDE 0.9% FLUSH: 3.0000 mL | Freq: Two times a day (BID) | INTRAVENOUS | Status: DC

## 2019-03-08 MED ORDER — SODIUM CHLORIDE 0.9 % IV SOLN
INTRAVENOUS | Status: DC | PRN
  Administered 2019-03-08: 500 mL

## 2019-03-08 MED ORDER — EPHEDRINE SULFATE-NACL 50-0.9 MG/10ML-% IV SOSY
PREFILLED_SYRINGE | INTRAVENOUS | Status: DC | PRN
  Administered 2019-03-08 (×2): 5 mg via INTRAVENOUS
  Administered 2019-03-08 (×2): 10 mg via INTRAVENOUS

## 2019-03-08 MED ORDER — MIDAZOLAM HCL 2 MG/2ML IJ SOLN
INTRAMUSCULAR | Status: AC
  Filled 2019-03-08: qty 2

## 2019-03-08 MED ORDER — HEPARIN SOD (PORK) LOCK FLUSH 100 UNIT/ML IV SOLN
INTRAVENOUS | Status: DC | PRN
  Administered 2019-03-08: 500 [IU] via INTRAVENOUS

## 2019-03-08 MED ORDER — SODIUM CHLORIDE 0.9 % IV SOLN
Freq: Once | INTRAVENOUS | Status: DC
  Filled 2019-03-08: qty 1.2

## 2019-03-08 MED ORDER — FENTANYL CITRATE (PF) 250 MCG/5ML IJ SOLN
INTRAMUSCULAR | Status: AC
  Filled 2019-03-08: qty 5

## 2019-03-08 MED ORDER — HEPARIN SOD (PORK) LOCK FLUSH 100 UNIT/ML IV SOLN
INTRAVENOUS | Status: AC
  Filled 2019-03-08: qty 5

## 2019-03-08 MED ORDER — ONDANSETRON HCL 4 MG/2ML IJ SOLN
INTRAMUSCULAR | Status: DC | PRN
  Administered 2019-03-08: 4 mg via INTRAVENOUS

## 2019-03-08 MED ORDER — LACTATED RINGERS IV SOLN
INTRAVENOUS | Status: DC
  Administered 2019-03-08: 10:00:00 via INTRAVENOUS

## 2019-03-08 MED ORDER — BUPIVACAINE-EPINEPHRINE 0.25% -1:200000 IJ SOLN
INTRAMUSCULAR | Status: DC | PRN
  Administered 2019-03-08: 14 mL

## 2019-03-08 MED ORDER — FENTANYL CITRATE (PF) 100 MCG/2ML IJ SOLN
INTRAMUSCULAR | Status: DC | PRN
  Administered 2019-03-08: 25 ug via INTRAVENOUS
  Administered 2019-03-08 (×2): 50 ug via INTRAVENOUS
  Administered 2019-03-08: 25 ug via INTRAVENOUS

## 2019-03-08 MED ORDER — LIDOCAINE 2% (20 MG/ML) 5 ML SYRINGE
INTRAMUSCULAR | Status: DC | PRN
  Administered 2019-03-08: 100 mg via INTRAVENOUS

## 2019-03-08 MED ORDER — PROPOFOL 10 MG/ML IV BOLUS
INTRAVENOUS | Status: DC | PRN
  Administered 2019-03-08: 150 mg via INTRAVENOUS

## 2019-03-08 SURGICAL SUPPLY — 32 items
BAG DECANTER FOR FLEXI CONT (MISCELLANEOUS) ×3 IMPLANT
BLADE SURG SZ11 CARB STEEL (BLADE) ×3 IMPLANT
CHLORAPREP W/TINT 26 (MISCELLANEOUS) ×3 IMPLANT
COVER WAND RF STERILE (DRAPES) IMPLANT
DECANTER SPIKE VIAL GLASS SM (MISCELLANEOUS) ×3 IMPLANT
DERMABOND ADVANCED (GAUZE/BANDAGES/DRESSINGS) ×2
DERMABOND ADVANCED .7 DNX12 (GAUZE/BANDAGES/DRESSINGS) ×1 IMPLANT
DRAPE C-ARM 42X120 X-RAY (DRAPES) ×3 IMPLANT
DRAPE LAPAROTOMY TRNSV 102X78 (DRAPES) ×3 IMPLANT
ELECT PENCIL ROCKER SW 15FT (MISCELLANEOUS) ×3 IMPLANT
ELECT REM PT RETURN 15FT ADLT (MISCELLANEOUS) ×3 IMPLANT
GAUZE 4X4 16PLY RFD (DISPOSABLE) ×3 IMPLANT
GLOVE BIO SURGEON STRL SZ 6.5 (GLOVE) ×2 IMPLANT
GLOVE BIO SURGEONS STRL SZ 6.5 (GLOVE) ×1
GLOVE BIOGEL PI IND STRL 7.0 (GLOVE) ×1 IMPLANT
GLOVE BIOGEL PI INDICATOR 7.0 (GLOVE) ×2
GOWN STRL REUS W/TWL 2XL LVL3 (GOWN DISPOSABLE) ×3 IMPLANT
GOWN STRL REUS W/TWL XL LVL3 (GOWN DISPOSABLE) ×3 IMPLANT
KIT BASIN OR (CUSTOM PROCEDURE TRAY) ×3 IMPLANT
KIT PORT POWER 8FR ISP CVUE (Port) ×2 IMPLANT
KIT TURNOVER KIT A (KITS) IMPLANT
NDL HYPO 25X1 1.5 SAFETY (NEEDLE) ×1 IMPLANT
NEEDLE HYPO 25X1 1.5 SAFETY (NEEDLE) ×3 IMPLANT
PACK BASIC VI WITH GOWN DISP (CUSTOM PROCEDURE TRAY) ×3 IMPLANT
SUT PROLENE 2 0 SH DA (SUTURE) ×3 IMPLANT
SUT VIC AB 3-0 SH 27 (SUTURE) ×2
SUT VIC AB 3-0 SH 27XBRD (SUTURE) ×1 IMPLANT
SUT VIC AB 4-0 PS2 18 (SUTURE) ×3 IMPLANT
SYR 10ML LL (SYRINGE) ×3 IMPLANT
SYR CONTROL 10ML LL (SYRINGE) ×3 IMPLANT
TOWEL OR 17X26 10 PK STRL BLUE (TOWEL DISPOSABLE) ×3 IMPLANT
TOWEL OR NON WOVEN STRL DISP B (DISPOSABLE) ×3 IMPLANT

## 2019-03-08 NOTE — Anesthesia Preprocedure Evaluation (Signed)
Anesthesia Evaluation  Patient identified by MRN, date of birth, ID band Patient awake    Reviewed: Allergy & Precautions, NPO status , Patient's Chart, lab work & pertinent test results  Airway Mallampati: I  TM Distance: >3 FB Neck ROM: Full    Dental   Pulmonary    Pulmonary exam normal        Cardiovascular Normal cardiovascular exam     Neuro/Psych    GI/Hepatic   Endo/Other    Renal/GU      Musculoskeletal   Abdominal   Peds  Hematology   Anesthesia Other Findings   Reproductive/Obstetrics                             Anesthesia Physical Anesthesia Plan  ASA: II  Anesthesia Plan: General   Post-op Pain Management:    Induction: Intravenous  PONV Risk Score and Plan: 2 and Ondansetron and Treatment may vary due to age or medical condition  Airway Management Planned: LMA  Additional Equipment:   Intra-op Plan:   Post-operative Plan: Extubation in OR  Informed Consent: I have reviewed the patients History and Physical, chart, labs and discussed the procedure including the risks, benefits and alternatives for the proposed anesthesia with the patient or authorized representative who has indicated his/her understanding and acceptance.       Plan Discussed with: CRNA and Surgeon  Anesthesia Plan Comments:         Anesthesia Quick Evaluation

## 2019-03-08 NOTE — Op Note (Signed)
03/08/2019  2:05 PM  PATIENT:  Jeffrey Day.  43 y.o. male  Patient Care Team: Vivi Barrack, MD as PCP - General (Family Medicine) Arna Snipe, RN as Oncology Nurse Navigator Leighton Ruff, MD as Consulting Physician (General Surgery) Ladell Pier, MD as Consulting Physician (Oncology)  PRE-OPERATIVE DIAGNOSIS:  RECTAL CANCER  POST-OPERATIVE DIAGNOSIS:  RECTAL CANCER  PROCEDURE: INSERTION PORT-A-CATH WITH ULTRASOUND GUIDANCE    Surgeon(s): Leighton Ruff, MD  ANESTHESIA:   local and MAC  EBL:  No intake/output data recorded.  DISPOSITION OF SPECIMEN:  N/A  COUNTS:  YES  PLAN OF CARE: Discharge to home after PACU  PATIENT DISPOSITION:  PACU - hemodynamically stable.  INDICATION: Patient with need for IV Chemotherapy. Port-A-Cath placement was requested.   Use of a central venous catheter for intravenous therapy was discussed. Technique of catheter placement using ultrasound and fluoroscopy guidance was discussed. Risks such as bleeding, infection, pneumothorax, catheter occlusion, reoperation, and other risks were discussed. I noted a good likelihood this will help address the problem. Questions were answered. The patient expressed understanding & wishes to proceed.   Findings: Normal-appearing anatomy.   8 [place] power port. It goes through the right internal jugular vein   Procedure: Informed consent was confirmed. Patient was brought the operating room and positioned supine. Arms were tucked. The patient underwent deep sedation. Neck and chest were clipped and prepped and draped in a sterile fashion. A surgical timeout confirmed our plan. I placed a field block of local anesthesia on the chest.  I entered into the right internal jugular vein on the first venipuncture using US guidance. Non-pulsatile blood was returned. Wire was easily passed into the inferior vena cava and confirmed by fluoroscopy. I confirmed placement of the wire in the right side of  the chest.  I made an incision in the lateral infraclavicular area and made a subcutaneous pocket. I used a dilator on the wire using Seldinger technique to dilate the tract under fluoroscopy. I placed the catheter into the sheath. I then peeled away the dilator sheath. I tunneled the power port from the puncture site to the chest pocket. I cut the catheter to appropriate length and attached it to the port using the plastic connector. The port was placed into the pocket and secured to the left anterior chest wall using 2-0 Prolene interrupted stitches x2. Catheter flushed well.  Fluoroscopy confirmed the tip in the distal SVC. Catheter aspirated and flushed well. On final fluoro reevaluation the tip seen to be in good position in the distal SVC.  I closed the wounds using 3-0 Vicryl interrupted sutures for the pocket and 4-0 Vicryl stitch was used to close the skin. Dermabond was used on the 2 incisions. CXR will be performed in PACU. Patient should go home later today. Catheter is okay to use.

## 2019-03-08 NOTE — Anesthesia Procedure Notes (Addendum)
Procedure Name: Intubation Date/Time: 03/08/2019 1:18 PM Performed by: Silas [place], CRNA Pre-anesthesia Checklist: Patient identified, Emergency Drugs available, Suction available and Patient being monitored Patient Re-evaluated:Patient Re-evaluated prior to induction Oxygen Delivery Method: Circle system utilized Preoxygenation: Pre-oxygenation with 100% oxygen Induction Type: IV induction LMA: LMA with gastric port inserted LMA Size: 4.0 Tube type: Oral Number of attempts: 1 Airway Equipment and Method: Stylet and Oral airway Placement Confirmation: ETT inserted through vocal cords under direct vision,  positive ETCO2 and breath sounds checked- equal and bilateral Tube secured with: Tape Dental Injury: Teeth and Oropharynx as per pre-operative assessment

## 2019-03-08 NOTE — Transfer of Care (Signed)
Immediate Anesthesia Transfer of Care Note  Patient: Jeffrey Day.  Procedure(s) Performed: INSERTION PORT-A-CATH WITH ULTRASOUND GUIDANCE (N/A )  Patient Location: PACU  Anesthesia Type:General  Level of Consciousness: awake, oriented and patient cooperative  Airway & Oxygen Therapy: Patient Spontanous Breathing and Patient connected to face mask oxygen  Post-op Assessment: Report given to RN and Post -op Vital signs reviewed and stable  Post vital signs: Reviewed and stable  Last Vitals:  Vitals Value Taken Time  BP 137/86 03/08/19 1430  Temp 36.4 C 03/08/19 1422  Pulse 90 03/08/19 1430  Resp 11 03/08/19 1430  SpO2 97 % 03/08/19 1430  Vitals shown include unvalidated device data.  Last Pain:  Vitals:   03/08/19 1422  TempSrc:   PainSc: 0-No pain         Complications: No apparent anesthesia complications

## 2019-03-08 NOTE — Discharge Instructions (Addendum)
PORT-A-CATH: POST OP INSTRUCTIONS  Always review your discharge instruction sheet given to you by the facility where your surgery was performed.   1. A prescription for pain medication may be given to you upon discharge. Take your pain medication as prescribed, if needed. If narcotic pain medicine is not needed, then you make take acetaminophen (Tylenol) or ibuprofen (Advil) as needed.  2. Take your usually prescribed medications unless otherwise directed. 3. If you need a refill on your pain medication, please contact our office. All narcotic pain medicine now requires a paper prescription.  Phoned in and fax refills are no longer allowed by law.  Prescriptions will not be filled after 5 pm or on weekends.  4. You should follow a light diet for the remainder of the day after your procedure. 5. Most patients will experience some mild swelling and/or bruising in the area of the incision. It may take several days to resolve. 6. It is common to experience some constipation if taking pain medication after surgery. Increasing fluid intake and taking a stool softener (such as Colace) will usually help or prevent this problem from occurring. A mild laxative (Milk of Magnesia or Miralax) should be taken according to package directions if there are no bowel movements after 48 hours.  7. Unless discharge instructions indicate otherwise, you may remove your bandages 48 hours after surgery, and you may shower at that time. You may have steri-strips (small white skin tapes) in place directly over the incision.  These strips should be left on the skin for 7-10 days.  If your surgeon used Dermabond (skin glue) on the incision, you may shower in 24 hours.  The glue will flake off over the next 2-3 weeks.  8. If your port is left accessed at the end of surgery (needle left in port), the dressing cannot get wet and should only by changed by a healthcare professional. When the port is no longer accessed (when the needle has  been removed), follow step 7.   9. ACTIVITIES:  Limit activity involving your arms for the next 72 hours. Do no strenuous exercise or activity for 1 week. You may drive when you are no longer taking prescription pain medication, you can comfortably wear a seatbelt, and you can maneuver your car. 10.You may need to see your doctor in the office for a follow-up appointment.  Please       check with your doctor.  11.When you receive a new Port-a-Cath, you will get a product guide and        ID card.  Please keep them in case you need them.  WHEN TO CALL YOUR DOCTOR [phone]): 1. Fever over 101.0 2. Chills 3. Continued bleeding from incision 4. Increased redness and tenderness at the site 5. Shortness of breath, difficulty breathing   The clinic staff is available to answer your questions during regular business hours. Please dont hesitate to call and ask to speak to one of the nurses or medical assistants for clinical concerns. If you have a medical emergency, go to the nearest emergency room or call 911.  A surgeon from [facility] is always on call at the hospital.     For further information, please visit [email or web address]      General Anesthesia, Adult, Care After This sheet gives you information about how to care for yourself after your procedure. Your health care provider may also give you more specific instructions. If you have problems or questions, contact your health care  provider. What can I expect after the procedure? After the procedure, the following side effects are common:  Pain or discomfort at the IV site.  Nausea.  Vomiting.  Sore throat.  Trouble concentrating.  Feeling cold or chills.  Weak or tired.  Sleepiness and fatigue.  Soreness and body aches. These side effects can affect parts of the body that were not involved in surgery. Follow these instructions at home:  For at least 24 hours after the procedure:  Have a  responsible adult stay with you. It is important to have someone help care for you until you are awake and alert.  Rest as needed.  Do not: ? Participate in activities in which you could fall or become injured. ? Drive. ? Use heavy machinery. ? Drink alcohol. ? Take sleeping pills or medicines that cause drowsiness. ? Make important decisions or sign legal documents. ? Take care of children on your own. Eating and drinking  Follow any instructions from your health care provider about eating or drinking restrictions.  When you feel hungry, start by eating small amounts of foods that are soft and easy to digest (bland), such as toast. Gradually return to your regular diet.  Drink enough fluid to keep your urine pale yellow.  If you vomit, rehydrate by drinking water, juice, or clear broth. General instructions  If you have sleep apnea, surgery and certain medicines can increase your risk for breathing problems. Follow instructions from your health care provider about wearing your sleep device: ? Anytime you are sleeping, including during daytime naps. ? While taking prescription pain medicines, sleeping medicines, or medicines that make you drowsy.  Return to your normal activities as told by your health care provider. Ask your health care provider what activities are safe for you.  Take over-the-counter and prescription medicines only as told by your health care provider.  If you smoke, do not smoke without supervision.  Keep all follow-up visits as told by your health care provider. This is important. Contact a health care provider if:  You have nausea or vomiting that does not get better with medicine.  You cannot eat or drink without vomiting.  You have pain that does not get better with medicine.  You are unable to pass urine.  You develop a skin rash.  You have a fever.  You have redness around your IV site that gets worse. Get help right away if:  You have  difficulty breathing.  You have chest pain.  You have blood in your urine or stool, or you vomit blood. Summary  After the procedure, it is common to have a sore throat or nausea. It is also common to feel tired.  Have a responsible adult stay with you for the first 24 hours after general anesthesia. It is important to have someone help care for you until you are awake and alert.  When you feel hungry, start by eating small amounts of foods that are soft and easy to digest (bland), such as toast. Gradually return to your regular diet.  Drink enough fluid to keep your urine pale yellow.  Return to your normal activities as told by your health care provider. Ask your health care provider what activities are safe for you. This information is not intended to replace advice given to you by your health care provider. Make sure you discuss any questions you have with your health care provider. Document Released: 11/01/2000 Document Revised: 07/29/2017 Document Reviewed: 03/11/2017 Elsevier Patient Education  2020  [employer] Inc[employer] ° °

## 2019-03-08 NOTE — H&P (Signed)
Jeffrey Eguia. is an 42 y.o. male.   Chief Complaint: Rectal cancer HPI: 43 y.o. M with distal rectal cancer.  He is ready to start chemo next week.  Past Medical History:  Diagnosis Date  . Allergy   . Rectal cancer [facility])     Past Surgical History:  Procedure Laterality Date  . COLONOSCOPY WITH ESOPHAGOGASTRODUODENOSCOPY (EGD)     biopsy  . EUS N/A 03/06/2019   Procedure: LOWER ENDOSCOPIC ULTRASOUND (EUS);  Surgeon: Irving Copas., MD;  Location: [place];  Service: Gastroenterology;  Laterality: N/A;  . FINGER SURGERY  2013   Traumatic Amputation   . FLEXIBLE SIGMOIDOSCOPY N/A 03/06/2019   Procedure: FLEXIBLE SIGMOIDOSCOPY;  Surgeon: Rush Landmark Telford Nab., MD;  Location: [place];  Service: Gastroenterology;  Laterality: N/A;    Family History  Problem Relation Age of Onset  . Lung cancer Paternal Grandmother   . Heart disease Maternal Grandmother   . Hypertension Neg Hx   . Stroke Neg Hx   . Colon cancer Neg Hx   . Esophageal cancer Neg Hx   . Colitis Neg Hx   . Inflammatory bowel disease Neg Hx   . Liver disease Neg Hx   . Pancreatic cancer Neg Hx   . Rectal cancer Neg Hx   . Stomach cancer Neg Hx    Social History:  reports that he has never smoked. His smokeless tobacco use includes chew. He reports current alcohol use. He reports that he does not use drugs.  Allergies:  Allergies  Allergen Reactions  . Penicillins Other (See Comments)    Childhood allergy Did it involve swelling of the face/tongue/throat, SOB, or low BP? Unknown Did it involve sudden or severe rash/hives, skin peeling, or any reaction on the inside of your mouth or nose? Unknown Did you need to seek medical attention at a hospital or doctor's office? Unknown When did it last happen?childhood allergy If all above answers are "NO", may proceed with cephalosporin use.     Facility-Administered Medications Prior to Admission  Medication Dose Route Frequency  Provider Last Rate Last Dose  . 0.9 %  sodium chloride infusion  500 mL Intravenous Once Mansouraty, Telford Nab., MD       Medications Prior to Admission  Medication Sig Dispense Refill  . ibuprofen (ADVIL) 200 MG tablet Take 400 mg by mouth every 6 (six) hours as needed for headache or moderate pain.    Marland Kitchen lidocaine-prilocaine (EMLA) cream Apply to portacath 1-2 hours prior to use 30 g 2  . omeprazole (PRILOSEC) 40 MG capsule Take 1 capsule (40 mg total) by mouth 2 (two) times daily at 8 am and 10 pm. (Patient taking differently: Take 40 mg by mouth 2 (two) times a day. ) 60 capsule 6  . polyethylene glycol (MIRALAX / GLYCOLAX) 17 g packet Take 17 g by mouth daily.    . prochlorperazine (COMPAZINE) 10 MG tablet Take 1 tablet (10 mg total) by mouth every 6 (six) hours as needed for nausea or vomiting. 30 tablet 0    Results for orders placed or performed during the hospital encounter of 03/07/19 (from the past 48 hour(s))  CBC     Status: None   Collection Time: 03/07/19  3:00 PM  Result Value Ref Range   WBC 9.5 4.0 - 10.5 K/uL   RBC 4.65 4.22 - 5.81 MIL/uL   Hemoglobin 14.2 13.0 - 17.0 g/dL   HCT 42.1 39.0 - 52.0 %   MCV 90.5 80.0 -  100.0 fL   MCH 30.5 26.0 - 34.0 pg   MCHC 33.7 30.0 - 36.0 g/dL   RDW 13.2 11.5 - 15.5 %   Platelets 310 150 - 400 K/uL   nRBC 0.0 0.0 - 0.2 %    Comment: Performed at [facility], [place] [address] [place], [place] [address]   No results found.  Review of Systems  Constitutional: Negative for chills and fever.  HENT: Negative for hearing loss.   Eyes: Negative for blurred vision.  Cardiovascular: Negative for chest pain and palpitations.  Gastrointestinal: Negative for abdominal pain, nausea and vomiting.  Genitourinary: Negative for dysuria and urgency.  Neurological: Negative for dizziness and headaches.    Blood pressure 122/90, pulse 73, temperature 99.4 F (37.4 C), temperature source Oral, resp. rate 18, SpO2 100 %.    Physical Exam  Constitutional: He is oriented to person, place, and time. He appears well-developed and well-nourished.  HENT:  Head: Normocephalic and atraumatic.  Eyes: Pupils are equal, round, and reactive to light. Conjunctivae and EOM are normal.  Neck: Normal range of motion.  Cardiovascular: Normal rate and regular rhythm.  Respiratory: Effort normal. No respiratory distress.  GI: Soft. He exhibits no distension. There is no abdominal tenderness.  Musculoskeletal: Normal range of motion.  Neurological: He is alert and oriented to person, place, and time.     Assessment/Plan 43 y.o. M with rectal cancer.  Total neoadjuvant chemotherapy was recommended.  He is ready for port placement.  Risks include bleeding, pain, infection and port malfunction.  There is a very small risk of Ptx.  I believe he understands this and agrees to proceed.    Rosario Adie, MD 3/66/4403, 12:01 PM

## 2019-03-08 NOTE — Anesthesia Postprocedure Evaluation (Signed)
Anesthesia Post Note  Patient: Andrzej Scully.  Procedure(s) Performed: INSERTION PORT-A-CATH WITH ULTRASOUND GUIDANCE (N/A )     Patient location during evaluation: PACU Anesthesia Type: General Level of consciousness: awake and alert Pain management: pain level controlled Vital Signs Assessment: post-procedure vital signs reviewed and stable Respiratory status: spontaneous breathing, nonlabored ventilation, respiratory function stable and patient connected to nasal cannula oxygen Cardiovascular status: blood pressure returned to baseline and stable Postop Assessment: no apparent nausea or vomiting Anesthetic complications: no    Last Vitals:  Vitals:   03/08/19 1500 03/08/19 1519  BP: 110/78 120/85  Pulse: 69 65  Resp: 13 12  Temp: (!) 36.4 C (!) 36.4 C  SpO2: 97% 99%    Last Pain:  Vitals:   03/08/19 1500  TempSrc:   PainSc: 0-No pain                 [place] Gipe

## 2019-03-09 ENCOUNTER — Encounter (HOSPITAL_COMMUNITY): Payer: Self-pay | Admitting: General Surgery

## 2019-03-09 NOTE — Progress Notes (Signed)
GI Location of Tumor / Histology: Malignant neoplasm of rectum  Jeffrey Day. presented with frequent small stools and occasional rectal bleeding.  He was also experiencing intermittent low abdominal and rectal pain.  He was started on miralax with no improvement.  CT CAP 02/22/2019: Wall thickening of the rectum.  A few subcentimeter perirectal lymph nodes.  Small bowel to small bowel intussusception within the jejunum which appears to partially resolve on delayed imaging.  2 mm nonspecific pulmonary nodule superior segment left lower lobe.  Colonoscopy 02/15/2019: frond-like/villous, fungating, infiltrative and ulcerated partially obstructing large mass extending from the dentate line into the distal and mid rectum.  Mass partially circumferential.  Mass measured 9 cm in length.  Biopsies showed invasive adenocarcinoma; preserved expression of the major and minor MMR proteins.   Upper endoscopy 02/15/2019: Distal esophagitis, esophageal mucosal changes suspicious for long segment Barrett's esophagus.  Biopsies of Rectum 02/15/2019   Past/Anticipated interventions by surgeon, if any:  Dr. Marcello Moores 02/20/2019 -Patient was found to have a new distal rectal cancer.   - On exam this appears to be abutting the anal sphincters. -We have discussed the possibility of needing a permanent colostomy. -He will undergo CT scans later this week.  I think he should undergo an endoscopic rectal ultrasound for staging completion and to evaluate the tumors proximity to the sphincter complex. - He will follow up with medical oncology and radiation oncology. -We will see him in the office after he has completed radiation treatment to evaluate his response and plan surgical resection.  Past/Anticipated interventions by medical oncology, if any:  Dr. Dewitt Hoes 02/28/2019 -His case was presented at the GI tumor conference.  The recommendation at present is for chemotherapy, total neoadjuvant approach,  followed by surgery.  He will complete 8 cycles of FOLFOX chemotherapy, then radiation/ Xeloda. -Chemotherapy start 03/15/2019   Weight changes, if any: Lost about 30 pounds since all of this started.  Bowel/Bladder complaints, if any: Has some diarrhea.  Taking miralax. No complaints of bladder changes.  Nausea / Vomiting, if any: No  Pain issues, if any: No  Any blood per rectum: Occasional blood in stools.  SAFETY ISSUES:  Prior radiation? No  Pacemaker/ICD? No  Possible current pregnancy? n/a  Is the patient on methotrexate? No  Current Complaints/Details: -Port Insertion 03/08/2019 - No MRI due to metal in his eye.

## 2019-03-11 ENCOUNTER — Other Ambulatory Visit: Payer: Self-pay | Admitting: Oncology

## 2019-03-12 ENCOUNTER — Telehealth: Payer: Self-pay | Admitting: Radiation Oncology

## 2019-03-12 NOTE — Telephone Encounter (Signed)
Left vm to confirm appt and verify info

## 2019-03-13 ENCOUNTER — Ambulatory Visit
Admission: RE | Admit: 2019-03-13 | Discharge: 2019-03-13 | Disposition: A | Discharge status: Home / Self Care | Payer: BC Managed Care – PPO | Source: Ambulatory Visit | Attending: Radiation Oncology | Admitting: Radiation Oncology

## 2019-03-13 ENCOUNTER — Encounter: Payer: Self-pay | Admitting: Radiation Oncology

## 2019-03-13 ENCOUNTER — Other Ambulatory Visit: Payer: Self-pay

## 2019-03-13 VITALS — Ht 69.0 in | Wt 176.0 lb

## 2019-03-13 DIAGNOSIS — C20 Malignant neoplasm of rectum: Secondary | ICD-10-CM

## 2019-03-13 NOTE — Progress Notes (Signed)
Radiation Oncology         [phone]) [phone] ________________________________  Name: Jeffrey Day.        MRN: [id number]  Date of Service: 03/13/2019 DOB: 04-Mar-1976  BC:[id number], Algis Greenhouse, MD  Mansouraty, Telford Nab.*     REFERRING PHYSICIAN: Mansouraty, Telford Nab.*   DIAGNOSIS: The encounter diagnosis was Rectal cancer [facility]).   HISTORY OF PRESENT ILLNESS: Jeffrey Day. is a 43 y.o. male seen at the request of Dr. Benay Spice.  The patient had been experiencing episodes of changes in bowel caliber and frequency as well as occasional rectal bleeding.  He began experiencing lower abdominal and pelvic pain and did not have improvement of his symptoms with MiraLAX.  He was referred to [facility] and was evaluated by Dr. Rush Landmark, and underwent EGD and colonoscopy on 02/15/2019.  This revealed a partially obstructing tumor in the mid to distal rectum.  His EGD revealed distal esophagitis and mucosal changes consistent with Barrett's esophagus, and a biopsy of the duodenum revealed peptic duodenitis without dysplastic change or malignancy also mild gastritis was seen within the random gastric biopsyAnd in the rectum a frond-like villous/fungating infiltrative ulcerated partially obstructing large mass was seen extending from the dentate line into the proximal and mid rectum, involving two thirds of the luminal circumference.  Nonbleeding hemorrhoids were also noted.  A biopsy of the rectal mass revealed invasive adenocarcinoma.  The patient proceeded with CT imaging for staging purposes, and this was performed on 02/22/2019.  It revealed wall thickening in the rectum consistent with his newly diagnosed cancer and a few subcentimeter perirectal lymph nodes.  There was small bowel to small bowel intussusception within the jejunum felt to be transient, as well as a nonspecific 2 mm pulmonary nodule in the left lower lobe.  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  His EUS was performed on 03/06/2019, and the mass measured 8 cm in length encountered 1 cm from the anal verge, involving 70% of the lumen there was evidence suggesting breakthrough of the muscularis propria with invasion into the perirectal fat but not into the internal anal sphincter, 1 lymph node was observed measuring 10.5 x 8.4 mm and this was staged as T3N1 disease.  He has met with Dr. Benay Spice and been offered total neoadjuvant chemotherapy followed by chemo RT and subsequent surgical resection.  He underwent Port-A-Cath placement with Dr. Marcello Moores on 03/08/2019, and is scheduled to begin systemic chemotherapy with FOLFOX on Thursday.  He is seen today via my chart to discuss the recommendations of chemo RT at the completion of his systemic neoadjuvant chemo.   PREVIOUS RADIATION THERAPY: No   PAST MEDICAL HISTORY:  Past Medical History:  Diagnosis Date   Allergy    Rectal cancer ([place])        PAST SURGICAL HISTORY: Past Surgical History:  Procedure Laterality Date   COLONOSCOPY WITH ESOPHAGOGASTRODUODENOSCOPY (EGD)     biopsy   EUS N/A 03/06/2019   Procedure: LOWER ENDOSCOPIC ULTRASOUND (EUS);  Surgeon: Irving Copas., MD;  Location: [facility];  Service: Gastroenterology;  Laterality: N/A;   FINGER SURGERY  2013   Traumatic Amputation    FLEXIBLE SIGMOIDOSCOPY N/A 03/06/2019   Procedure: FLEXIBLE SIGMOIDOSCOPY;  Surgeon: Rush Landmark Telford Nab., MD;  Location: [facility];  Service: Gastroenterology;  Laterality: N/A;  PORTACATH PLACEMENT N/A 03/08/2019   Procedure: INSERTION PORT-A-CATH WITH ULTRASOUND GUIDANCE;  Surgeon: Leighton Ruff, MD;  Location: [facility];  Service: General;  Laterality: N/A;  LMA     FAMILY HISTORY:  Family History  Problem Relation Age of Onset   Lung cancer Paternal Grandmother    Heart disease Maternal Grandmother      Hypertension Neg Hx    Stroke Neg Hx    Colon cancer Neg Hx    Esophageal cancer Neg Hx    Colitis Neg Hx    Inflammatory bowel disease Neg Hx    Liver disease Neg Hx    Pancreatic cancer Neg Hx    Rectal cancer Neg Hx    Stomach cancer Neg Hx      SOCIAL HISTORY:  reports that he has never smoked. His smokeless tobacco use includes chew. He reports current alcohol use. He reports that he does not use drugs. The patient is married and lives in [place]. He has an 45 year old son and is very active. 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   ALLERGIES: Penicillins   MEDICATIONS:  Current Outpatient Medications  Medication Sig Dispense Refill   ibuprofen (ADVIL) 200 MG tablet Take 400 mg by mouth every 6 (six) hours as needed for headache or moderate pain.     lidocaine-prilocaine (EMLA) cream Apply to portacath 1-2 hours prior to use 30 g 2   omeprazole (PRILOSEC) 40 MG capsule Take 1 capsule (40 mg total) by mouth 2 (two) times daily at 8 am and 10 pm. (Patient taking differently: Take 40 mg by mouth 2 (two) times a day. ) 60 capsule 6   polyethylene glycol (MIRALAX / GLYCOLAX) 17 g packet Take 17 g by mouth daily.     prochlorperazine (COMPAZINE) 10 MG tablet Take 1 tablet (10 mg total) by mouth every 6 (six) hours as needed for nausea or vomiting. 30 tablet 0   Current Facility-Administered Medications  Medication Dose Route Frequency Provider Last Rate Last Dose   0.9 %  sodium chloride infusion  500 mL Intravenous Once Mansouraty, Telford Nab., MD         REVIEW OF SYSTEMS: On review of systems, the patient reports that he is doing well overall. He denies any chest pain, shortness of breath, cough, fevers, chills, night sweats, unintended weight changes. He is having intermittent rectal bleeding. He denies any bladder disturbances, and denies abdominal pain, nausea or vomiting. He denies any new  musculoskeletal or joint aches or pains. A complete review of systems is obtained and is otherwise negative.     PHYSICAL EXAM:  Wt Readings from Last 3 Encounters:  03/07/19 148 lb (67.1 kg)  03/06/19 176 lb (79.8 kg)  02/28/19 175 lb (79.4 kg)   In general this is a well appearing caucasian male in no acute distress. He's alert and oriented x4 and appropriate throughout the examination. Cardiopulmonary assessment is negative for acute distress and he exhibits normal effort.    ECOG = 1  0 - Asymptomatic (Fully active, able to carry on all predisease activities without restriction)  1 - Symptomatic but completely ambulatory (Restricted in physically strenuous activity but ambulatory and able to carry out work of a light or sedentary nature. For example, light housework, office work)  2 - Symptomatic, <50% in bed during the day (Ambulatory and capable of all self care but unable to carry out any work activities. Up  and about more than 50% of waking hours)  3 - Symptomatic, >50% in bed, but not bedbound (Capable of only limited self-care, confined to bed or chair 50% or more of waking hours)  4 - Bedbound (Completely disabled. Cannot carry on any self-care. Totally confined to bed or chair)  5 - Death   Eustace Pen MM, Creech RH, Tormey DC, et al. [phone]). "Toxicity and response criteria of the [facility]". [place] Oncol. 5 (6): 649-55    LABORATORY DATA:  Lab Results  Component Value Date   WBC 9.5 03/07/2019   HGB 14.2 03/07/2019   HCT 42.1 03/07/2019   MCV 90.5 03/07/2019   PLT 310 03/07/2019   Lab Results  Component Value Date   NA 139 02/12/2019   K 4.0 02/12/2019   CL 100 02/12/2019   CO2 29 02/12/2019   Lab Results  Component Value Date   ALT 10 02/12/2019   AST 10 02/12/2019   ALKPHOS 93 02/12/2019   BILITOT 0.4 02/12/2019      RADIOGRAPHY: Ct Chest W Contrast  Result Date: 02/22/2019 CLINICAL DATA:  Patient with recent diagnosis  of rectal carcinoma. Evaluate for metastasis. EXAM: CT CHEST, ABDOMEN, AND PELVIS WITH CONTRAST TECHNIQUE: Multidetector CT imaging of the chest, abdomen and pelvis was performed following the standard protocol during bolus administration of intravenous contrast. CONTRAST:  169m OMNIPAQUE IOHEXOL 300 MG/ML  SOLN COMPARISON:  None. FINDINGS: CT CHEST FINDINGS Cardiovascular: Normal heart size. Trace fluid superior pericardial recess. Aorta and main pulmonary artery are normal in caliber. Mediastinum/Nodes: No enlarged axillary, mediastinal or hilar lymphadenopathy. Normal appearance of the esophagus. Lungs/Pleura: Central airways are patent. No large area pulmonary consolidation. No pleural effusion or pneumothorax. There is a 2 mm nodule within the superior segment left lower lobe (image 64; series 5). Musculoskeletal: Thoracic spine degenerative changes. No aggressive or acute appearing osseous lesions. CT ABDOMEN PELVIS FINDINGS Hepatobiliary: The liver is normal in size and contour. No focal hepatic lesion is identified. Gallbladder is unremarkable. No intrahepatic or extrahepatic biliary ductal dilatation. Pancreas: Unremarkable Spleen: Unremarkable Adrenals/Urinary Tract: Normal adrenal glands. Kidneys enhance symmetrically with contrast. No hydronephrosis. Urinary bladder is unremarkable. Stomach/Bowel: There is wall thickening of the rectum. No evidence for upstream bowel obstruction. No pericolic fat stranding. Normal appendix. Oral contrast material within the small bowel. There is a small bowel to small bowel intussusception within the left upper quadrant (image 68; series 6) which appears to partially resolve on the delayed images (image 14; series 101). No free fluid or free intraperitoneal air. Normal morphology of the stomach. Vascular/Lymphatic: Normal caliber abdominal aorta. No retroperitoneal lymphadenopathy.There are a few subcentimeter perirectal lymph nodes measuring up to 6 mm (image 112;  series 3). Reproductive: Heterogeneous prostate. Other: None. Musculoskeletal: Bilateral L5 pars defects with grade 1 anterolisthesis of L5 on S1. No aggressive or acute appearing osseous lesions. IMPRESSION: There is wall thickening of the rectum compatible with history of recently diagnosed rectal carcinoma. There are a few subcentimeter perirectal lymph nodes which are nonspecific. Metastatic disease to this location is not excluded. There is a small bowel to small bowel intussusception within the jejunum which appears to partially resolve on delayed imaging. These are usually transient in etiology. Given history of malignancy, the possibility of a malignant process as causative etiology is not entirely excluded. Consider short-term follow-up exam to assess for resolution. Otherwise no evidence for distant metastasis within the chest, abdomen or pelvis. There is a 2 mm nonspecific pulmonary nodule  superior segment left lower lobe. Recommend attention on follow-up. Electronically Signed   By: Lovey Newcomer M.D.   On: 02/22/2019 17:27   Ct Abdomen Pelvis W Contrast  Result Date: 02/22/2019 CLINICAL DATA:  Patient with recent diagnosis of rectal carcinoma. Evaluate for metastasis. EXAM: CT CHEST, ABDOMEN, AND PELVIS WITH CONTRAST TECHNIQUE: Multidetector CT imaging of the chest, abdomen and pelvis was performed following the standard protocol during bolus administration of intravenous contrast. CONTRAST:  127m OMNIPAQUE IOHEXOL 300 MG/ML  SOLN COMPARISON:  None. FINDINGS: CT CHEST FINDINGS Cardiovascular: Normal heart size. Trace fluid superior pericardial recess. Aorta and main pulmonary artery are normal in caliber. Mediastinum/Nodes: No enlarged axillary, mediastinal or hilar lymphadenopathy. Normal appearance of the esophagus. Lungs/Pleura: Central airways are patent. No large area pulmonary consolidation. No pleural effusion or pneumothorax. There is a 2 mm nodule within the superior segment left lower lobe  (image 64; series 5). Musculoskeletal: Thoracic spine degenerative changes. No aggressive or acute appearing osseous lesions. CT ABDOMEN PELVIS FINDINGS Hepatobiliary: The liver is normal in size and contour. No focal hepatic lesion is identified. Gallbladder is unremarkable. No intrahepatic or extrahepatic biliary ductal dilatation. Pancreas: Unremarkable Spleen: Unremarkable Adrenals/Urinary Tract: Normal adrenal glands. Kidneys enhance symmetrically with contrast. No hydronephrosis. Urinary bladder is unremarkable. Stomach/Bowel: There is wall thickening of the rectum. No evidence for upstream bowel obstruction. No pericolic fat stranding. Normal appendix. Oral contrast material within the small bowel. There is a small bowel to small bowel intussusception within the left upper quadrant (image 68; series 6) which appears to partially resolve on the delayed images (image 14; series 101). No free fluid or free intraperitoneal air. Normal morphology of the stomach. Vascular/Lymphatic: Normal caliber abdominal aorta. No retroperitoneal lymphadenopathy.There are a few subcentimeter perirectal lymph nodes measuring up to 6 mm (image 112; series 3). Reproductive: Heterogeneous prostate. Other: None. Musculoskeletal: Bilateral L5 pars defects with grade 1 anterolisthesis of L5 on S1. No aggressive or acute appearing osseous lesions. IMPRESSION: There is wall thickening of the rectum compatible with history of recently diagnosed rectal carcinoma. There are a few subcentimeter perirectal lymph nodes which are nonspecific. Metastatic disease to this location is not excluded. There is a small bowel to small bowel intussusception within the jejunum which appears to partially resolve on delayed imaging. These are usually transient in etiology. Given history of malignancy, the possibility of a malignant process as causative etiology is not entirely excluded. Consider short-term follow-up exam to assess for resolution. Otherwise  no evidence for distant metastasis within the chest, abdomen or pelvis. There is a 2 mm nonspecific pulmonary nodule superior segment left lower lobe. Recommend attention on follow-up. Electronically Signed   By: DLovey NewcomerM.D.   On: 02/22/2019 17:27   Dg Chest Port 1 View  Result Date: 03/08/2019 CLINICAL DATA:  Post op port placement. Hx of rectal ca. EXAM: PORTABLE CHEST 1 VIEW COMPARISON:  02/22/2019 FINDINGS: Patient has a RIGHT-sided PowerPort, tip overlying the superior vena cava. No evidence for pneumothorax. Heart size is normal. Lungs are clear. IMPRESSION: Interval placement of RIGHT-sided PowerPort, tip overlying the superior vena cava. Electronically Signed   By: ENolon NationsM.D.   On: 03/08/2019 14:58   Dg C-arm 1-60 Min-no Report  Result Date: 03/08/2019 Fluoroscopy was utilized by the requesting physician.  No radiographic interpretation.       IMPRESSION/PLAN: 1. Stage IIIB, cT3N1M0 invasive adenocarcinoma of the rectum. Dr. MLisbeth Renshawdiscusses the pathology findings and reviews the nature of rectal carcinoma. He outlines the  discussion we've had about the patient in prior GI conference. He appears to be a good candidate for total neoadjuvant chemotherapy first, followed by chemoRT, followed by surgical resection.  We discussed the risks, benefits, short, and long term effects of radiotherapy, and the patient is interested in proceeding at the appropriate interval. Dr. Lisbeth Renshaw discusses the delivery and logistics of radiotherapy and anticipates a course of 5 1/2 weeks of radiotherapy. We will follow up with the patient about 6 months from now to coordinate a follow up discussion and subsequent simulation. He is in agreement with this plan and will let us know of any questions or concerns prior to our next discussion.  This encounter was provided by telemedicine platform MyChart.  The patient has given verbal consent for this type of encounter and has been advised to only accept a  meeting of this type in a secure network environment. The time spent during this encounter was 45 minutes. The attendants for this meeting include Blenda Nicely, RN, Dr. Lisbeth Renshaw, Hayden Pedro  and Jeffrey Day..  During the encounter,  Blenda Nicely, RN, Dr. Lisbeth Renshaw, and Hayden Pedro were located at [facility] Radiation Oncology Department.  Jeffrey Day. was located at work in his car.   The above documentation reflects my direct findings during this shared patient visit. Please see the separate note by Dr. Lisbeth Renshaw on this date for the remainder of the patient's plan of care.    Carola Rhine, PAC

## 2019-03-14 ENCOUNTER — Encounter: Payer: Self-pay | Admitting: Oncology

## 2019-03-14 ENCOUNTER — Telehealth: Payer: Self-pay

## 2019-03-14 NOTE — Progress Notes (Signed)
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  Unfortunately there aren't any foundations offering copay assistance for his Dx and the type of ins he has.  I offered the [employer], went over what it covers and gave him the income requirement.  Pt stated he exceeds the income requirement so he doesn't qualify at this time.  I will give him my card on 03/15/19 in case his situation changes and would like to apply in the future and for any questions or concerns he may have in the future.

## 2019-03-14 NOTE — Telephone Encounter (Signed)
Called patient to touch base prior to his first chemo tx tomorrow. No questions, no needs.

## 2019-03-15 ENCOUNTER — Inpatient Hospital Stay: Payer: BC Managed Care – PPO

## 2019-03-15 ENCOUNTER — Encounter: Payer: Self-pay | Admitting: Nurse Practitioner

## 2019-03-15 ENCOUNTER — Inpatient Hospital Stay: Payer: BC Managed Care – PPO | Attending: Nurse Practitioner | Admitting: Nurse Practitioner

## 2019-03-15 ENCOUNTER — Other Ambulatory Visit: Payer: Self-pay

## 2019-03-15 VITALS — BP 124/84 | HR 69 | Temp 98.7°F | Resp 18 | Ht 69.0 in | Wt 176.9 lb

## 2019-03-15 DIAGNOSIS — R911 Solitary pulmonary nodule: Secondary | ICD-10-CM | POA: Insufficient documentation

## 2019-03-15 DIAGNOSIS — R200 Anesthesia of skin: Secondary | ICD-10-CM | POA: Diagnosis not present

## 2019-03-15 DIAGNOSIS — R6884 Jaw pain: Secondary | ICD-10-CM | POA: Insufficient documentation

## 2019-03-15 DIAGNOSIS — Z5111 Encounter for antineoplastic chemotherapy: Secondary | ICD-10-CM | POA: Insufficient documentation

## 2019-03-15 DIAGNOSIS — C20 Malignant neoplasm of rectum: Secondary | ICD-10-CM

## 2019-03-15 LAB — CBC WITH DIFFERENTIAL (CANCER CENTER ONLY)
Abs Immature Granulocytes: 0.01 10*3/uL (ref 0.00–0.07)
Basophils Absolute: 0.1 10*3/uL (ref 0.0–0.1)
Basophils Relative: 1 %
Eosinophils Absolute: 0.1 10*3/uL (ref 0.0–0.5)
Eosinophils Relative: 1 %
HCT: 41.9 % (ref 39.0–52.0)
Hemoglobin: 13.7 g/dL (ref 13.0–17.0)
Immature Granulocytes: 0 %
Lymphocytes Relative: 18 %
Lymphs Abs: 1 10*3/uL (ref 0.7–4.0)
MCH: 29.3 pg (ref 26.0–34.0)
MCHC: 32.7 g/dL (ref 30.0–36.0)
MCV: 89.7 fL (ref 80.0–100.0)
Monocytes Absolute: 0.5 10*3/uL (ref 0.1–1.0)
Monocytes Relative: 10 %
Neutro Abs: 3.6 10*3/uL (ref 1.7–7.7)
Neutrophils Relative %: 70 %
Platelet Count: 273 10*3/uL (ref 150–400)
RBC: 4.67 MIL/uL (ref 4.22–5.81)
RDW: 13.2 % (ref 11.5–15.5)
WBC Count: 5.2 10*3/uL (ref 4.0–10.5)
nRBC: 0 % (ref 0.0–0.2)

## 2019-03-15 LAB — CMP (CANCER CENTER ONLY)
ALT: 17 U/L (ref 0–44)
AST: 14 U/L — ABNORMAL LOW (ref 15–41)
Albumin: 3.9 g/dL (ref 3.5–5.0)
Alkaline Phosphatase: 94 U/L (ref 38–126)
Anion gap: 11 (ref 5–15)
BUN: 12 mg/dL (ref 6–20)
CO2: 26 mmol/L (ref 22–32)
Calcium: 9.6 mg/dL (ref 8.9–10.3)
Chloride: 105 mmol/L (ref 98–111)
Creatinine: 0.89 mg/dL (ref 0.61–1.24)
GFR, Est AFR Am: >60 mL/min (ref >60)
GFR, Estimated: >60 mL/min (ref >60)
Glucose, Bld: 110 mg/dL — ABNORMAL HIGH (ref 70–99)
Potassium: 4.4 mmol/L (ref 3.5–5.1)
Sodium: 142 mmol/L (ref 135–145)
Total Bilirubin: 0.3 mg/dL (ref 0.3–1.2)
Total Protein: 7.1 g/dL (ref 6.5–8.1)

## 2019-03-15 LAB — CEA (IN HOUSE-CHCC): CEA (CHCC-In House): 8.2 ng/mL — ABNORMAL HIGH (ref 0.00–5.00)

## 2019-03-15 MED ORDER — DEXTROSE 5 % IV SOLN
Freq: Once | INTRAVENOUS | Status: DC
  Filled 2019-03-15: qty 250

## 2019-03-15 MED ORDER — FLUOROURACIL CHEMO INJECTION 2.5 GM/50ML
400.0000 mg/m2 | Freq: Once | INTRAVENOUS | Status: AC
  Administered 2019-03-15: 14:00:00 800 mg via INTRAVENOUS
  Filled 2019-03-15: qty 16

## 2019-03-15 MED ORDER — DEXTROSE 5 % IV SOLN
Freq: Once | INTRAVENOUS | Status: AC
  Administered 2019-03-15: 11:00:00 via INTRAVENOUS
  Filled 2019-03-15: qty 250

## 2019-03-15 MED ORDER — PALONOSETRON HCL INJECTION 0.25 MG/5ML
0.2500 mg | Freq: Once | INTRAVENOUS | Status: AC
  Administered 2019-03-15: 0.25 mg via INTRAVENOUS

## 2019-03-15 MED ORDER — PALONOSETRON HCL INJECTION 0.25 MG/5ML
INTRAVENOUS | Status: AC
  Filled 2019-03-15: qty 5

## 2019-03-15 MED ORDER — LEUCOVORIN CALCIUM INJECTION 350 MG
400.0000 mg/m2 | Freq: Once | INTRAVENOUS | Status: AC
  Administered 2019-03-15: 780 mg via INTRAVENOUS
  Filled 2019-03-15: qty 39

## 2019-03-15 MED ORDER — SODIUM CHLORIDE 0.9 % IV SOLN
2400.0000 mg/m2 | INTRAVENOUS | Status: DC
  Administered 2019-03-15: 4700 mg via INTRAVENOUS
  Filled 2019-03-15: qty 94

## 2019-03-15 MED ORDER — OXALIPLATIN CHEMO INJECTION 100 MG/20ML
85.0000 mg/m2 | Freq: Once | INTRAVENOUS | Status: AC
  Administered 2019-03-15: 12:00:00 165 mg via INTRAVENOUS
  Filled 2019-03-15: qty 33

## 2019-03-15 MED ORDER — DEXAMETHASONE SODIUM PHOSPHATE 10 MG/ML IJ SOLN
10.0000 mg | Freq: Once | INTRAMUSCULAR | Status: AC
  Administered 2019-03-15: 10 mg via INTRAVENOUS

## 2019-03-15 MED ORDER — DEXAMETHASONE SODIUM PHOSPHATE 10 MG/ML IJ SOLN
INTRAMUSCULAR | Status: AC
  Filled 2019-03-15: qty 1

## 2019-03-15 NOTE — Progress Notes (Signed)
Saw pt in infusion room & to see if he had any questions about his education on FOLFOX.  He denies any questions.  Discussed [employer] pump & showed pt pump & carry bag & discussed do's & don'ts, went over pump information & pt signed assignment of benefits page.  Pt informed to call with any questions.

## 2019-03-15 NOTE — Progress Notes (Addendum)
  [place] OFFICE PROGRESS NOTE   Diagnosis: Rectal cancer  INTERVAL HISTORY:   Jeffrey Day returns as scheduled.  Bowels are moving.  He is taking MiraLAX.  He continues to have pain and bleeding with bowel movements.  Tylenol is effective.  No nausea or vomiting.  Appetite is stable.  He has noted a small area of numbness at the lower lip since the Port-A-Cath was placed.  The numbness is improving.  Objective:  Vital signs in last 24 hours:  Blood pressure 124/84, pulse 69, temperature 98.7 F (37.1 C), temperature source Oral, resp. rate 18, height '5\' 9"'$  (1.753 m), weight 176 lb 14.4 oz (80.2 kg), SpO2 100 %.    HEENT: No thrush or ulcers. GI: Abdomen soft and nontender.  No hepatomegaly. Vascular: No leg edema. Skin: Palms without erythema. Port-A-Cath without erythema.   Lab Results:  Lab Results  Component Value Date   WBC 5.2 03/15/2019   HGB 13.7 03/15/2019   HCT 41.9 03/15/2019   MCV 89.7 03/15/2019   PLT 273 03/15/2019   NEUTROABS 3.6 03/15/2019    Imaging:  No results found.  Medications: I have reviewed the patient's current medications.  Assessment/Plan: 1. Rectal cancer  Colonoscopy 02/15/2019- frond-like/villous, fungating, infiltrative and ulcerated partially obstructing large mass extending from the dentate line into the distal and mid rectum.  Mass partially circumferential.  Mass measured 9 cm in length.  Biopsies showed invasive adenocarcinoma; preserved expression of the major and minor MMR proteins.    Upper endoscopy 02/15/2019-distal esophagitis, esophageal mucosal changes suspicious for long segment Barrett's esophagus.  Biopsy of the duodenum showed peptic duodenitis with no dysplasia or malignancy; random gastric biopsy showed mild chronic gastritis and no intestinal metaplasia dysplasia or malignancy. Distal esophagus biopsy showed intestinal metaplasia consistent with Barrett's esophagus and no dysplasia or malignancy.  CTs  02/22/2019- wall thickening of the rectum.  A few subcentimeter perirectal lymph nodes.  Small bowel to small bowel intussusception within the jejunum which appears to partially resolve on delayed imaging.  2 mm nonspecific pulmonary nodule superior segment left lower lobe.  03/06/2019 lower endoscopic ultrasound- flex impression-malignant partially obstructing tumor in the proximal rectum and in the mid rectum, internal hemorrhoids.  EUS impression- rectal mass visualized endosonography, staged T3N1.  1 malignant appearing lymph node was visualized in a peritumoral location.  Endosonographic appearance suspicious for metastatic colorectal adenocarcinoma.  Internal anal sphincter was visualized and appeared normal.  No sign of significant pathology at the rectosigmoid junction and in the sigmoid colon.  Prostate noted with calcification within it. 2. Change in bowel habits secondary to # 08 Dec 2018 3. Rectal bleeding secondary to # 08 Dec 2018 4. Weight loss 5. Port-A-Cath placement, Dr. Marcello Moores, 03/08/2019  Disposition: Jeffrey Day appears stable.  Plan to proceed with cycle 1 FOLFOX today as scheduled.  We reviewed the labs from today, adequate for treatment.  He will return for lab, follow-up, cycle 2 FOLFOX in 2 weeks.  He will contact the office in the interim with any problems.  Patient seen with Dr. Benay Spice.    [name] ANP/GNP-BC   03/15/2019  10:20 AM  This was a shared visit with [name].  Jeffrey Day has been diagnosed with clinical stage III rectal cancer.  The plan is to proceed with neoadjuvant FOLFOX today.  Julieanne Manson, MD

## 2019-03-15 NOTE — Progress Notes (Signed)
Met with Hervey during first chemo tx today to offer support.  No questions, no needs. Will continue to follow as needed.

## 2019-03-15 NOTE — Patient Instructions (Addendum)
[facility] Discharge Instructions for Patients Receiving Chemotherapy  Today you received the following chemotherapy agents oxaliplatin/leucovorin/florouracil   To help prevent nausea and vomiting after your treatment, we encourage you to take your nausea medication as directed   If you develop nausea and vomiting that is not controlled by your nausea medication, call the clinic.   BELOW ARE SYMPTOMS THAT SHOULD BE REPORTED IMMEDIATELY:  *FEVER GREATER THAN 100.5 F  *CHILLS WITH OR WITHOUT FEVER  NAUSEA AND VOMITING THAT IS NOT CONTROLLED WITH YOUR NAUSEA MEDICATION  *UNUSUAL SHORTNESS OF BREATH  *UNUSUAL BRUISING OR BLEEDING  TENDERNESS IN MOUTH AND THROAT WITH OR WITHOUT PRESENCE OF ULCERS  *URINARY PROBLEMS  *BOWEL PROBLEMS  UNUSUAL RASH Items with * indicate a potential emergency and should be followed up as soon as possible.  Feel free to call the clinic you have any questions or concerns. The clinic phone number is [phone].  Oxaliplatin Injection What is this medicine? OXALIPLATIN (ox AL i PLA tin) is a chemotherapy drug. It targets fast dividing cells, like cancer cells, and causes these cells to die. This medicine is used to treat cancers of the colon and rectum, and many other cancers. This medicine may be used for other purposes; ask your health care provider or pharmacist if you have questions. COMMON BRAND NAME(S): Eloxatin What should I tell my health care provider before I take this medicine? They need to know if you have any of these conditions:  kidney disease  an unusual or allergic reaction to oxaliplatin, other chemotherapy, other medicines, foods, dyes, or preservatives  pregnant or trying to get pregnant  breast-feeding How should I use this medicine? This drug is given as an infusion into a vein. It is administered in a hospital or clinic by a specially trained health care professional. Talk to your pediatrician regarding  the use of this medicine in children. Special care may be needed. Overdosage: If you think you have taken too much of this medicine contact a poison control center or emergency room at once. NOTE: This medicine is only for you. Do not share this medicine with others. What if I miss a dose? It is important not to miss a dose. Call your doctor or health care professional if you are unable to keep an appointment. What may interact with this medicine?  medicines to increase blood counts like filgrastim, pegfilgrastim, sargramostim  probenecid  some antibiotics like amikacin, gentamicin, neomycin, polymyxin B, streptomycin, tobramycin  zalcitabine Talk to your doctor or health care professional before taking any of these medicines:  acetaminophen  aspirin  ibuprofen  ketoprofen  naproxen This list may not describe all possible interactions. Give your health care provider a list of all the medicines, herbs, non-prescription drugs, or dietary supplements you use. Also tell them if you smoke, drink alcohol, or use illegal drugs. Some items may interact with your medicine. What should I watch for while using this medicine? Your condition will be monitored carefully while you are receiving this medicine. You will need important blood work done while you are taking this medicine. This medicine can make you more sensitive to cold. Do not drink cold drinks or use ice. Cover exposed skin before coming in contact with cold temperatures or cold objects. When out in cold weather wear warm clothing and cover your mouth and nose to warm the air that goes into your lungs. Tell your doctor if you get sensitive to the cold. This drug may make you feel  generally unwell. This is not uncommon, as chemotherapy can affect healthy cells as well as cancer cells. Report any side effects. Continue your course of treatment even though you feel ill unless your doctor tells you to stop. In some cases, you may be given  additional medicines to help with side effects. Follow all directions for their use. Call your doctor or health care professional for advice if you get a fever, chills or sore throat, or other symptoms of a cold or flu. Do not treat yourself. This drug decreases your body's ability to fight infections. Try to avoid being around people who are sick. This medicine may increase your risk to bruise or bleed. Call your doctor or health care professional if you notice any unusual bleeding. Be careful brushing and flossing your teeth or using a toothpick because you may get an infection or bleed more easily. If you have any dental work done, tell your dentist you are receiving this medicine. Avoid taking products that contain aspirin, acetaminophen, ibuprofen, naproxen, or ketoprofen unless instructed by your doctor. These medicines may hide a fever. Do not become pregnant while taking this medicine. Women should inform their doctor if they wish to become pregnant or think they might be pregnant. There is a potential for serious side effects to an unborn child. Talk to your health care professional or pharmacist for more information. Do not breast-feed an infant while taking this medicine. Call your doctor or health care professional if you get diarrhea. Do not treat yourself. What side effects may I notice from receiving this medicine? Side effects that you should report to your doctor or health care professional as soon as possible:  allergic reactions like skin rash, itching or hives, swelling of the face, lips, or tongue  low blood counts - This drug may decrease the number of white blood cells, red blood cells and platelets. You may be at increased risk for infections and bleeding.  signs of infection - fever or chills, cough, sore throat, pain or difficulty passing urine  signs of decreased platelets or bleeding - bruising, pinpoint red spots on the skin, black, tarry stools, nosebleeds  signs of  decreased red blood cells - unusually weak or tired, fainting spells, lightheadedness  breathing problems  chest pain, pressure  cough  diarrhea  jaw tightness  mouth sores  nausea and vomiting  pain, swelling, redness or irritation at the injection site  pain, tingling, numbness in the hands or feet  problems with balance, talking, walking  redness, blistering, peeling or loosening of the skin, including inside the mouth  trouble passing urine or change in the amount of urine Side effects that usually do not require medical attention (report to your doctor or health care professional if they continue or are bothersome):  changes in vision  constipation  hair loss  loss of appetite  metallic taste in the mouth or changes in taste  stomach pain This list may not describe all possible side effects. Call your doctor for medical advice about side effects. You may report side effects to FDA at 1-800-FDA-1088. Where should I keep my medicine? This drug is given in a hospital or clinic and will not be stored at home. NOTE: This sheet is a summary. It may not cover all possible information. If you have questions about this medicine, talk to your doctor, pharmacist, or health care provider.  2020 Elsevier/Gold Standard (2008-02-20 17:22:47) Fluorouracil, 5-FU injection What is this medicine? FLUOROURACIL, 5-FU (flure oh YOOR a  sil) is a chemotherapy drug. It slows the growth of cancer cells. This medicine is used to treat many types of cancer like breast cancer, colon or rectal cancer, pancreatic cancer, and stomach cancer. This medicine may be used for other purposes; ask your health care provider or pharmacist if you have questions. COMMON BRAND NAME(S): Adrucil What should I tell my health care provider before I take this medicine? They need to know if you have any of these conditions:  blood disorders  dihydropyrimidine dehydrogenase (DPD) deficiency  infection  (especially a virus infection such as chickenpox, cold sores, or herpes)  kidney disease  liver disease  malnourished, poor nutrition  recent or ongoing radiation therapy  an unusual or allergic reaction to fluorouracil, other chemotherapy, other medicines, foods, dyes, or preservatives  pregnant or trying to get pregnant  breast-feeding How should I use this medicine? This drug is given as an infusion or injection into a vein. It is administered in a hospital or clinic by a specially trained health care professional. Talk to your pediatrician regarding the use of this medicine in children. Special care may be needed. Overdosage: If you think you have taken too much of this medicine contact a poison control center or emergency room at once. NOTE: This medicine is only for you. Do not share this medicine with others. What if I miss a dose? It is important not to miss your dose. Call your doctor or health care professional if you are unable to keep an appointment. What may interact with this medicine?  allopurinol  cimetidine  dapsone  digoxin  hydroxyurea  leucovorin  levamisole  medicines for seizures like ethotoin, fosphenytoin, phenytoin  medicines to increase blood counts like filgrastim, pegfilgrastim, sargramostim  medicines that treat or prevent blood clots like warfarin, enoxaparin, and dalteparin  methotrexate  metronidazole  pyrimethamine  some other chemotherapy drugs like busulfan, cisplatin, estramustine, vinblastine  trimethoprim  trimetrexate  vaccines Talk to your doctor or health care professional before taking any of these medicines:  acetaminophen  aspirin  ibuprofen  ketoprofen  naproxen This list may not describe all possible interactions. Give your health care provider a list of all the medicines, herbs, non-prescription drugs, or dietary supplements you use. Also tell them if you smoke, drink alcohol, or use illegal drugs. Some  items may interact with your medicine. What should I watch for while using this medicine? Visit your doctor for checks on your progress. This drug may make you feel generally unwell. This is not uncommon, as chemotherapy can affect healthy cells as well as cancer cells. Report any side effects. Continue your course of treatment even though you feel ill unless your doctor tells you to stop. In some cases, you may be given additional medicines to help with side effects. Follow all directions for their use. Call your doctor or health care professional for advice if you get a fever, chills or sore throat, or other symptoms of a cold or flu. Do not treat yourself. This drug decreases your body's ability to fight infections. Try to avoid being around people who are sick. This medicine may increase your risk to bruise or bleed. Call your doctor or health care professional if you notice any unusual bleeding. Be careful brushing and flossing your teeth or using a toothpick because you may get an infection or bleed more easily. If you have any dental work done, tell your dentist you are receiving this medicine. Avoid taking products that contain aspirin, acetaminophen, ibuprofen, naproxen,  or ketoprofen unless instructed by your doctor. These medicines may hide a fever. Do not become pregnant while taking this medicine. Women should inform their doctor if they wish to become pregnant or think they might be pregnant. There is a potential for serious side effects to an unborn child. Talk to your health care professional or pharmacist for more information. Do not breast-feed an infant while taking this medicine. Men should inform their doctor if they wish to father a child. This medicine may lower sperm counts. Do not treat diarrhea with over the counter products. Contact your doctor if you have diarrhea that lasts more than 2 days or if it is severe and watery. This medicine can make you more sensitive to the sun.  Keep out of the sun. If you cannot avoid being in the sun, wear protective clothing and use sunscreen. Do not use sun lamps or tanning beds/booths. What side effects may I notice from receiving this medicine? Side effects that you should report to your doctor or health care professional as soon as possible:  allergic reactions like skin rash, itching or hives, swelling of the face, lips, or tongue  low blood counts - this medicine may decrease the number of white blood cells, red blood cells and platelets. You may be at increased risk for infections and bleeding.  signs of infection - fever or chills, cough, sore throat, pain or difficulty passing urine  signs of decreased platelets or bleeding - bruising, pinpoint red spots on the skin, black, tarry stools, blood in the urine  signs of decreased red blood cells - unusually weak or tired, fainting spells, lightheadedness  breathing problems  changes in vision  chest pain  mouth sores  nausea and vomiting  pain, swelling, redness at site where injected  pain, tingling, numbness in the hands or feet  redness, swelling, or sores on hands or feet  stomach pain  unusual bleeding Side effects that usually do not require medical attention (report to your doctor or health care professional if they continue or are bothersome):  changes in finger or toe nails  diarrhea  dry or itchy skin  hair loss  headache  loss of appetite  sensitivity of eyes to the light  stomach upset  unusually teary eyes This list may not describe all possible side effects. Call your doctor for medical advice about side effects. You may report side effects to FDA at 1-800-FDA-1088. Where should I keep my medicine? This drug is given in a hospital or clinic and will not be stored at home. NOTE: This sheet is a summary. It may not cover all possible information. If you have questions about this medicine, talk to your doctor, pharmacist, or health  care provider.  2020 Elsevier/Gold Standard (2007-11-29 13:53:16)

## 2019-03-16 ENCOUNTER — Encounter: Payer: Self-pay | Admitting: Nurse Practitioner

## 2019-03-16 ENCOUNTER — Telehealth: Payer: Self-pay | Admitting: *Deleted

## 2019-03-16 NOTE — Telephone Encounter (Signed)
Called pt at home to discuss how he did with his chemo treatment yesterday.  He denies problems/concerns except some cold sensitivity in his mouth.  Reminded to be careful & drink/eat room temp or warmer foods/drinks.  Discussed pump & denies any issues.  He knows to come in tomorrow & to come back to treatment area to have pump removed.  Reminded to call if any questions/concerns come up.

## 2019-03-17 ENCOUNTER — Inpatient Hospital Stay: Payer: BC Managed Care – PPO

## 2019-03-17 ENCOUNTER — Other Ambulatory Visit: Payer: Self-pay

## 2019-03-17 VITALS — BP 138/87 | HR 78 | Temp 98.9°F | Resp 18

## 2019-03-17 DIAGNOSIS — R200 Anesthesia of skin: Secondary | ICD-10-CM | POA: Diagnosis not present

## 2019-03-17 DIAGNOSIS — C20 Malignant neoplasm of rectum: Secondary | ICD-10-CM | POA: Diagnosis not present

## 2019-03-17 DIAGNOSIS — R6884 Jaw pain: Secondary | ICD-10-CM | POA: Diagnosis not present

## 2019-03-17 DIAGNOSIS — R911 Solitary pulmonary nodule: Secondary | ICD-10-CM | POA: Diagnosis not present

## 2019-03-17 DIAGNOSIS — Z5111 Encounter for antineoplastic chemotherapy: Secondary | ICD-10-CM | POA: Diagnosis not present

## 2019-03-17 MED ORDER — SODIUM CHLORIDE 0.9% FLUSH
10.0000 mL | INTRAVENOUS | Status: DC | PRN
  Administered 2019-03-17: 10 mL
  Filled 2019-03-17: qty 10

## 2019-03-17 MED ORDER — HEPARIN SOD (PORK) LOCK FLUSH 100 UNIT/ML IV SOLN
500.0000 [IU] | Freq: Once | INTRAVENOUS | Status: AC | PRN
  Administered 2019-03-17: 500 [IU]
  Filled 2019-03-17: qty 5

## 2019-03-23 ENCOUNTER — Ambulatory Visit: Payer: BC Managed Care – PPO | Admitting: Gastroenterology

## 2019-03-25 ENCOUNTER — Other Ambulatory Visit: Payer: Self-pay | Admitting: Oncology

## 2019-03-27 ENCOUNTER — Telehealth: Payer: Self-pay

## 2019-03-27 NOTE — Telephone Encounter (Signed)
Left vm for pt regarding prescreening questions for appointment on 8/19. ° °

## 2019-03-28 ENCOUNTER — Inpatient Hospital Stay: Payer: BC Managed Care – PPO

## 2019-03-28 ENCOUNTER — Other Ambulatory Visit: Payer: Self-pay

## 2019-03-28 ENCOUNTER — Inpatient Hospital Stay (HOSPITAL_BASED_OUTPATIENT_CLINIC_OR_DEPARTMENT_OTHER): Payer: BC Managed Care – PPO | Admitting: Oncology

## 2019-03-28 VITALS — BP 122/77 | HR 78 | Temp 99.8°F | Resp 16 | Ht 69.0 in | Wt 173.9 lb

## 2019-03-28 DIAGNOSIS — Z95828 Presence of other vascular implants and grafts: Secondary | ICD-10-CM | POA: Insufficient documentation

## 2019-03-28 DIAGNOSIS — Z5111 Encounter for antineoplastic chemotherapy: Secondary | ICD-10-CM | POA: Diagnosis not present

## 2019-03-28 DIAGNOSIS — C20 Malignant neoplasm of rectum: Secondary | ICD-10-CM

## 2019-03-28 DIAGNOSIS — R6884 Jaw pain: Secondary | ICD-10-CM | POA: Diagnosis not present

## 2019-03-28 DIAGNOSIS — R200 Anesthesia of skin: Secondary | ICD-10-CM | POA: Diagnosis not present

## 2019-03-28 DIAGNOSIS — R911 Solitary pulmonary nodule: Secondary | ICD-10-CM | POA: Diagnosis not present

## 2019-03-28 LAB — CMP (CANCER CENTER ONLY)
ALT: 11 U/L (ref 0–44)
AST: 12 U/L — ABNORMAL LOW (ref 15–41)
Albumin: 4 g/dL (ref 3.5–5.0)
Alkaline Phosphatase: 97 U/L (ref 38–126)
Anion gap: 9 (ref 5–15)
BUN: 17 mg/dL (ref 6–20)
CO2: 26 mmol/L (ref 22–32)
Calcium: 9.4 mg/dL (ref 8.9–10.3)
Chloride: 108 mmol/L (ref 98–111)
Creatinine: 0.87 mg/dL (ref 0.61–1.24)
GFR, Est AFR Am: >60 mL/min (ref >60)
GFR, Estimated: >60 mL/min (ref >60)
Glucose, Bld: 99 mg/dL (ref 70–99)
Potassium: 4.3 mmol/L (ref 3.5–5.1)
Sodium: 143 mmol/L (ref 135–145)
Total Bilirubin: 0.3 mg/dL (ref 0.3–1.2)
Total Protein: 7.2 g/dL (ref 6.5–8.1)

## 2019-03-28 LAB — CBC WITH DIFFERENTIAL (CANCER CENTER ONLY)
Abs Immature Granulocytes: 0.01 10*3/uL (ref 0.00–0.07)
Basophils Absolute: 0.1 10*3/uL (ref 0.0–0.1)
Basophils Relative: 2 %
Eosinophils Absolute: 0 10*3/uL (ref 0.0–0.5)
Eosinophils Relative: 1 %
HCT: 38.9 % — ABNORMAL LOW (ref 39.0–52.0)
Hemoglobin: 13.1 g/dL (ref 13.0–17.0)
Immature Granulocytes: 0 %
Lymphocytes Relative: 28 %
Lymphs Abs: 1.1 10*3/uL (ref 0.7–4.0)
MCH: 29.6 pg (ref 26.0–34.0)
MCHC: 33.7 g/dL (ref 30.0–36.0)
MCV: 87.8 fL (ref 80.0–100.0)
Monocytes Absolute: 0.5 10*3/uL (ref 0.1–1.0)
Monocytes Relative: 14 %
Neutro Abs: 2.2 10*3/uL (ref 1.7–7.7)
Neutrophils Relative %: 55 %
Platelet Count: 267 10*3/uL (ref 150–400)
RBC: 4.43 MIL/uL (ref 4.22–5.81)
RDW: 13 % (ref 11.5–15.5)
WBC Count: 3.9 10*3/uL — ABNORMAL LOW (ref 4.0–10.5)
nRBC: 0 % (ref 0.0–0.2)

## 2019-03-28 MED ORDER — DEXAMETHASONE SODIUM PHOSPHATE 10 MG/ML IJ SOLN
10.0000 mg | Freq: Once | INTRAMUSCULAR | Status: AC
  Administered 2019-03-28: 13:00:00 10 mg via INTRAVENOUS

## 2019-03-28 MED ORDER — FLUOROURACIL CHEMO INJECTION 2.5 GM/50ML
400.0000 mg/m2 | Freq: Once | INTRAVENOUS | Status: AC
  Administered 2019-03-28: 16:00:00 800 mg via INTRAVENOUS
  Filled 2019-03-28: qty 16

## 2019-03-28 MED ORDER — DEXTROSE 5 % IV SOLN
Freq: Once | INTRAVENOUS | Status: AC
  Administered 2019-03-28: 13:00:00 via INTRAVENOUS
  Filled 2019-03-28: qty 250

## 2019-03-28 MED ORDER — SODIUM CHLORIDE 0.9% FLUSH
10.0000 mL | INTRAVENOUS | Status: DC | PRN
  Administered 2019-03-28: 10 mL
  Filled 2019-03-28: qty 10

## 2019-03-28 MED ORDER — PALONOSETRON HCL INJECTION 0.25 MG/5ML
INTRAVENOUS | Status: AC
  Filled 2019-03-28: qty 5

## 2019-03-28 MED ORDER — LEUCOVORIN CALCIUM INJECTION 350 MG
400.0000 mg/m2 | Freq: Once | INTRAVENOUS | Status: AC
  Administered 2019-03-28: 780 mg via INTRAVENOUS
  Filled 2019-03-28: qty 39

## 2019-03-28 MED ORDER — OXALIPLATIN CHEMO INJECTION 100 MG/20ML
85.0000 mg/m2 | Freq: Once | INTRAVENOUS | Status: AC
  Administered 2019-03-28: 165 mg via INTRAVENOUS
  Filled 2019-03-28: qty 33

## 2019-03-28 MED ORDER — PALONOSETRON HCL INJECTION 0.25 MG/5ML
0.2500 mg | Freq: Once | INTRAVENOUS | Status: AC
  Administered 2019-03-28: 0.25 mg via INTRAVENOUS

## 2019-03-28 MED ORDER — DEXAMETHASONE SODIUM PHOSPHATE 10 MG/ML IJ SOLN
INTRAMUSCULAR | Status: AC
  Filled 2019-03-28: qty 1

## 2019-03-28 MED ORDER — SODIUM CHLORIDE 0.9 % IV SOLN
2400.0000 mg/m2 | INTRAVENOUS | Status: DC
  Administered 2019-03-28: 4700 mg via INTRAVENOUS
  Filled 2019-03-28: qty 94

## 2019-03-28 NOTE — Patient Instructions (Signed)
[facility] for Patients Receiving Chemotherapy  Today you received the following chemotherapy agents: Oxaliplatin (Eloxatin), Leucovorin, Fluorouracil (Adrucil, 5-FU)  To help prevent nausea and vomiting after your treatment, we encourage you to take your nausea medication as directed.   If you develop nausea and vomiting that is not controlled by your nausea medication, call the clinic.   BELOW ARE SYMPTOMS THAT SHOULD BE REPORTED IMMEDIATELY:  *FEVER GREATER THAN 100.5 F  *CHILLS WITH OR WITHOUT FEVER  NAUSEA AND VOMITING THAT IS NOT CONTROLLED WITH YOUR NAUSEA MEDICATION  *UNUSUAL SHORTNESS OF BREATH  *UNUSUAL BRUISING OR BLEEDING  TENDERNESS IN MOUTH AND THROAT WITH OR WITHOUT PRESENCE OF ULCERS  *URINARY PROBLEMS  *BOWEL PROBLEMS  UNUSUAL RASH Items with * indicate a potential emergency and should be followed up as soon as possible.  Feel free to call the clinic should you have any questions or concerns. The clinic phone number is [phone].  Please show the CHEMO ALERT CARD at check-in to the Emergency Department and triage nurse.  Coronavirus (COVID-19) Are you at risk?  Are you at risk for the Coronavirus (COVID-19)?  To be considered HIGH RISK for Coronavirus (COVID-19), you have to meet the following criteria:  . Traveled to [place], [place], [place], [place] or [place]; or in the [place] to [place], [place], [place], or [place]; and have fever, cough, and shortness of breath within the last 2 weeks of travel OR . Been in close contact with a person diagnosed with COVID-19 within the last 2 weeks and have fever, cough, and shortness of breath . IF YOU DO NOT MEET THESE CRITERIA, YOU ARE CONSIDERED LOW RISK FOR COVID-19.  What to do if you are HIGH RISK for COVID-19?  . If you are having a medical emergency, call 911. . Seek medical care right away. Before you go to a doctor's office, urgent care or  emergency department, call ahead and tell them about your recent travel, contact with someone diagnosed with COVID-19, and your symptoms. You should receive instructions from your physician's office regarding next steps of care.  . When you arrive at healthcare provider, tell the healthcare staff immediately you have returned from visiting [place], [place], [place], [place] or [place]; or traveled in the [place] to [place], [place], [place], or [place]; in the last two weeks or you have been in close contact with a person diagnosed with COVID-19 in the last 2 weeks.   . Tell the health care staff about your symptoms: fever, cough and shortness of breath. . After you have been seen by a medical provider, you will be either: o Tested for (COVID-19) and discharged home on quarantine except to seek medical care if symptoms worsen, and asked to  - Stay home and avoid contact with others until you get your results (4-5 days)  - Avoid travel on public transportation if possible (such as bus, train, or airplane) or o Sent to the Emergency Department by EMS for evaluation, COVID-19 testing, and possible admission depending on your condition and test results.  What to do if you are LOW RISK for COVID-19?  Reduce your risk of any infection by using the same precautions used for avoiding the common cold or flu:  . Wash your hands often with soap and warm water for at least 20 seconds.  If soap and water are not readily available, use an alcohol-based hand sanitizer with at least 60% alcohol.  .   If coughing or sneezing, cover your mouth and nose by coughing or sneezing into the elbow areas of your shirt or coat, into a tissue or into your sleeve (not your hands). . Avoid shaking hands with others and consider head nods or verbal greetings only. . Avoid touching your eyes, nose, or mouth with unwashed hands.  . Avoid close contact with people who are sick. . Avoid places or events with large numbers  of people in one location, like concerts or sporting events. . Carefully consider travel plans you have or are making. . If you are planning any travel outside or inside the [place], visit the CDC's Travelers' Health webpage for the latest health notices. . If you have some symptoms but not all symptoms, continue to monitor at home and seek medical attention if your symptoms worsen. . If you are having a medical emergency, call 911.   [place] / e-Visit: [email or web address]         [facility]: [facility]: [phone]                   [facility]: [phone]

## 2019-03-28 NOTE — Progress Notes (Signed)
[facility] OFFICE PROGRESS NOTE   Diagnosis: Rectal cancer  INTERVAL HISTORY:   Jeffrey Day completed cycle 1 FOLFOX 03/15/2019.  He had mild nausea following chemotherapy.  He did not take nausea medication.  No emesis.  He had cold sensitivity in the mouth lasting 4 days following chemotherapy.  He also had jaw pain and pain with swallowing for 1 week following chemotherapy.  No diarrhea, or hand/foot pain.  He continues to have intermittent rectal bleeding.  Objective:  Vital signs in last 24 hours:  Blood pressure 122/77, pulse 78, temperature 99.8 F (37.7 C), temperature source Oral, resp. rate 16, height 5' 9" (1.753 m), weight 173 lb 14.4 oz (78.9 kg), SpO2 100 %.    HEENT: No thrush or ulcers GI: Soft, no hepatomegaly, nontender Vascular: No leg edema  Skin: Palms without erythema  Portacath/PICC-without erythema  Lab Results:  Lab Results  Component Value Date   WBC 3.9 (L) 03/28/2019   HGB 13.1 03/28/2019   HCT 38.9 (L) 03/28/2019   MCV 87.8 03/28/2019   PLT 267 03/28/2019   NEUTROABS 2.2 03/28/2019    CMP  Lab Results  Component Value Date   NA 142 03/15/2019   K 4.4 03/15/2019   CL 105 03/15/2019   CO2 26 03/15/2019   GLUCOSE 110 (H) 03/15/2019   BUN 12 03/15/2019   CREATININE 0.89 03/15/2019   CALCIUM 9.6 03/15/2019   PROT 7.1 03/15/2019   ALBUMIN 3.9 03/15/2019   AST 14 (L) 03/15/2019   ALT 17 03/15/2019   ALKPHOS 94 03/15/2019   BILITOT 0.3 03/15/2019   GFRNONAA >60 03/15/2019   GFRAA >60 03/15/2019    Lab Results  Component Value Date   CEA1 8.20 (H) 03/15/2019     Medications: I have reviewed the patient's current medications.   Assessment/Plan: 1. Rectal cancer  Colonoscopy 02/15/2019- frond-like/villous, fungating, infiltrative and ulcerated partially obstructing large mass extending from the dentate line into the distal and mid rectum.  Mass partially circumferential.  Mass measured 9 cm in length.  Biopsies  showed invasive adenocarcinoma; preserved expression of the major and minor MMR proteins.    Upper endoscopy 02/15/2019-distal esophagitis, esophageal mucosal changes suspicious for long segment Barrett's esophagus.  Biopsy of the duodenum showed peptic duodenitis with no dysplasia or malignancy; random gastric biopsy showed mild chronic gastritis and no intestinal metaplasia dysplasia or malignancy. Distal esophagus biopsy showed intestinal metaplasia consistent with Barrett's esophagus and no dysplasia or malignancy.  CTs 02/22/2019- wall thickening of the rectum.  A few subcentimeter perirectal lymph nodes.  Small bowel to small bowel intussusception within the jejunum which appears to partially resolve on delayed imaging.  2 mm nonspecific pulmonary nodule superior segment left lower lobe.  03/06/2019 lower endoscopic ultrasound- flex impression-malignant partially obstructing tumor in the proximal rectum and in the mid rectum, internal hemorrhoids.  EUS impression- rectal mass visualized endosonography, staged T3N1.  1 malignant appearing lymph node was visualized in a peritumoral location.  Endosonographic appearance suspicious for metastatic colorectal adenocarcinoma.  Internal anal sphincter was visualized and appeared normal.  No sign of significant pathology at the rectosigmoid junction and in the sigmoid colon.  Prostate noted with calcification within it.  Cycle 1 FOLFOX 03/15/2019  Cycle 2 FOLFOX 03/28/2019 2. Change in bowel habits secondary to # 08 Dec 2018 3. Rectal bleeding secondary to # 08 Dec 2018 4. Weight loss 5. Port-A-Cath placement, Dr. Marcello Moores, 03/08/2019    Disposition: Jeffrey Day tolerated the first cycle of  FOLFOX well.  He will complete cycle 2 today.  He will return for an office visit and cycle 3 chemotherapy in 2 weeks.  Betsy Coder, MD  03/28/2019  12:20 PM

## 2019-03-29 ENCOUNTER — Telehealth: Payer: Self-pay | Admitting: Oncology

## 2019-03-29 NOTE — Telephone Encounter (Signed)
Called and left msg. Mailed printout  °

## 2019-03-30 ENCOUNTER — Other Ambulatory Visit: Payer: Self-pay

## 2019-03-30 ENCOUNTER — Inpatient Hospital Stay: Payer: BC Managed Care – PPO

## 2019-03-30 VITALS — BP 124/86 | HR 79 | Temp 98.0°F | Resp 18

## 2019-03-30 DIAGNOSIS — Z5111 Encounter for antineoplastic chemotherapy: Secondary | ICD-10-CM | POA: Diagnosis not present

## 2019-03-30 DIAGNOSIS — C20 Malignant neoplasm of rectum: Secondary | ICD-10-CM | POA: Diagnosis not present

## 2019-03-30 DIAGNOSIS — R911 Solitary pulmonary nodule: Secondary | ICD-10-CM | POA: Diagnosis not present

## 2019-03-30 DIAGNOSIS — R200 Anesthesia of skin: Secondary | ICD-10-CM | POA: Diagnosis not present

## 2019-03-30 DIAGNOSIS — Z95828 Presence of other vascular implants and grafts: Secondary | ICD-10-CM

## 2019-03-30 DIAGNOSIS — R6884 Jaw pain: Secondary | ICD-10-CM | POA: Diagnosis not present

## 2019-03-30 MED ORDER — SODIUM CHLORIDE 0.9% FLUSH
10.0000 mL | INTRAVENOUS | Status: DC | PRN
  Administered 2019-03-30: 10 mL
  Filled 2019-03-30: qty 10

## 2019-03-30 MED ORDER — HEPARIN SOD (PORK) LOCK FLUSH 100 UNIT/ML IV SOLN
500.0000 [IU] | Freq: Once | INTRAVENOUS | Status: AC | PRN
  Administered 2019-03-30: 500 [IU]
  Filled 2019-03-30: qty 5

## 2019-03-30 NOTE — Patient Instructions (Signed)

## 2019-04-07 ENCOUNTER — Other Ambulatory Visit: Payer: Self-pay | Admitting: Oncology

## 2019-04-12 ENCOUNTER — Telehealth: Payer: Self-pay | Admitting: Oncology

## 2019-04-12 ENCOUNTER — Inpatient Hospital Stay: Payer: BC Managed Care – PPO

## 2019-04-12 ENCOUNTER — Other Ambulatory Visit: Payer: Self-pay

## 2019-04-12 ENCOUNTER — Inpatient Hospital Stay: Payer: BC Managed Care – PPO | Attending: Nurse Practitioner | Admitting: Oncology

## 2019-04-12 VITALS — BP 130/95 | HR 105 | Temp 98.9°F | Resp 17 | Ht 69.0 in | Wt 176.6 lb

## 2019-04-12 DIAGNOSIS — R634 Abnormal weight loss: Secondary | ICD-10-CM | POA: Insufficient documentation

## 2019-04-12 DIAGNOSIS — C20 Malignant neoplasm of rectum: Secondary | ICD-10-CM | POA: Diagnosis not present

## 2019-04-12 DIAGNOSIS — Z5111 Encounter for antineoplastic chemotherapy: Secondary | ICD-10-CM | POA: Insufficient documentation

## 2019-04-12 DIAGNOSIS — Z95828 Presence of other vascular implants and grafts: Secondary | ICD-10-CM

## 2019-04-12 DIAGNOSIS — K625 Hemorrhage of anus and rectum: Secondary | ICD-10-CM | POA: Diagnosis not present

## 2019-04-12 DIAGNOSIS — R194 Change in bowel habit: Secondary | ICD-10-CM | POA: Insufficient documentation

## 2019-04-12 LAB — CBC WITH DIFFERENTIAL (CANCER CENTER ONLY)
Abs Immature Granulocytes: 0.01 10*3/uL (ref 0.00–0.07)
Basophils Absolute: 0.1 10*3/uL (ref 0.0–0.1)
Basophils Relative: 1 %
Eosinophils Absolute: 0.1 10*3/uL (ref 0.0–0.5)
Eosinophils Relative: 1 %
HCT: 38 % — ABNORMAL LOW (ref 39.0–52.0)
Hemoglobin: 13.2 g/dL (ref 13.0–17.0)
Immature Granulocytes: 0 %
Lymphocytes Relative: 32 %
Lymphs Abs: 1.2 10*3/uL (ref 0.7–4.0)
MCH: 30 pg (ref 26.0–34.0)
MCHC: 34.7 g/dL (ref 30.0–36.0)
MCV: 86.4 fL (ref 80.0–100.0)
Monocytes Absolute: 0.6 10*3/uL (ref 0.1–1.0)
Monocytes Relative: 17 %
Neutro Abs: 1.8 10*3/uL (ref 1.7–7.7)
Neutrophils Relative %: 49 %
Platelet Count: 191 10*3/uL (ref 150–400)
RBC: 4.4 MIL/uL (ref 4.22–5.81)
RDW: 13.5 % (ref 11.5–15.5)
WBC Count: 3.7 10*3/uL — ABNORMAL LOW (ref 4.0–10.5)
nRBC: 0 % (ref 0.0–0.2)

## 2019-04-12 LAB — CMP (CANCER CENTER ONLY)
ALT: 28 U/L (ref 0–44)
AST: 21 U/L (ref 15–41)
Albumin: 4 g/dL (ref 3.5–5.0)
Alkaline Phosphatase: 100 U/L (ref 38–126)
Anion gap: 7 (ref 5–15)
BUN: 13 mg/dL (ref 6–20)
CO2: 28 mmol/L (ref 22–32)
Calcium: 9.5 mg/dL (ref 8.9–10.3)
Chloride: 106 mmol/L (ref 98–111)
Creatinine: 0.81 mg/dL (ref 0.61–1.24)
GFR, Est AFR Am: >60 mL/min (ref >60)
GFR, Estimated: >60 mL/min (ref >60)
Glucose, Bld: 98 mg/dL (ref 70–99)
Potassium: 4.8 mmol/L (ref 3.5–5.1)
Sodium: 141 mmol/L (ref 135–145)
Total Bilirubin: 0.3 mg/dL (ref 0.3–1.2)
Total Protein: 7 g/dL (ref 6.5–8.1)

## 2019-04-12 MED ORDER — DEXAMETHASONE SODIUM PHOSPHATE 10 MG/ML IJ SOLN
10.0000 mg | Freq: Once | INTRAMUSCULAR | Status: AC
  Administered 2019-04-12: 12:00:00 10 mg via INTRAVENOUS

## 2019-04-12 MED ORDER — PALONOSETRON HCL INJECTION 0.25 MG/5ML
INTRAVENOUS | Status: AC
  Filled 2019-04-12: qty 5

## 2019-04-12 MED ORDER — LEUCOVORIN CALCIUM INJECTION 350 MG
400.0000 mg/m2 | Freq: Once | INTRAVENOUS | Status: AC
  Administered 2019-04-12: 13:00:00 780 mg via INTRAVENOUS
  Filled 2019-04-12: qty 39

## 2019-04-12 MED ORDER — DEXAMETHASONE SODIUM PHOSPHATE 10 MG/ML IJ SOLN
INTRAMUSCULAR | Status: AC
  Filled 2019-04-12: qty 1

## 2019-04-12 MED ORDER — FLUOROURACIL CHEMO INJECTION 2.5 GM/50ML
400.0000 mg/m2 | Freq: Once | INTRAVENOUS | Status: AC
  Administered 2019-04-12: 15:00:00 800 mg via INTRAVENOUS
  Filled 2019-04-12: qty 16

## 2019-04-12 MED ORDER — PALONOSETRON HCL INJECTION 0.25 MG/5ML
0.2500 mg | Freq: Once | INTRAVENOUS | Status: AC
  Administered 2019-04-12: 0.25 mg via INTRAVENOUS

## 2019-04-12 MED ORDER — SODIUM CHLORIDE 0.9% FLUSH
10.0000 mL | INTRAVENOUS | Status: DC | PRN
  Administered 2019-04-12: 11:00:00 10 mL
  Filled 2019-04-12: qty 10

## 2019-04-12 MED ORDER — DEXTROSE 5 % IV SOLN
Freq: Once | INTRAVENOUS | Status: AC
  Administered 2019-04-12: 12:00:00 via INTRAVENOUS
  Filled 2019-04-12: qty 250

## 2019-04-12 MED ORDER — SODIUM CHLORIDE 0.9 % IV SOLN
2400.0000 mg/m2 | INTRAVENOUS | Status: DC
  Administered 2019-04-12: 16:00:00 4700 mg via INTRAVENOUS
  Filled 2019-04-12: qty 94

## 2019-04-12 MED ORDER — OXALIPLATIN CHEMO INJECTION 100 MG/20ML
85.0000 mg/m2 | Freq: Once | INTRAVENOUS | Status: AC
  Administered 2019-04-12: 13:00:00 165 mg via INTRAVENOUS
  Filled 2019-04-12: qty 33

## 2019-04-12 NOTE — Progress Notes (Signed)
[facility] OFFICE PROGRESS NOTE    Diagnosis: Rectal cancer  INTERVAL HISTORY:   [name] completed #1 FOLFOX on 03/28/2019.  He reports cold sensitivity for approximately 4 days following chemotherapy.  No nausea/vomiting, mouth sores, diarrhea, or peripheral neuropathy symptoms.  He has noted improvement in rectal urgency, pain, and bleeding.  He is working.  Objective:  Vital signs in last 24 hours:  Blood pressure (!) 130/95, pulse (!) 105, temperature 98.9 F (37.2 C), temperature source Temporal, resp. rate 17, height _0  (1.753 m), weight 176 lb 9.6 oz (80.1 kg), SpO2 100 %.    HEENT: No thrush or ulcers Resp: Lungs clear bilaterally Cardio: Regular rate and rhythm GI: No hepatosplenomegaly, nontender Vascular: No leg edema  Skin: Palms without erythema  Portacath/PICC-without erythema  Lab Results:  Lab Results  Component Value Date   WBC 3.7 (L) 04/12/2019   HGB 13.2 04/12/2019   HCT 38.0 (L) 04/12/2019   MCV 86.4 04/12/2019   PLT 191 04/12/2019   NEUTROABS 1.8 04/12/2019    CMP  Lab Results  Component Value Date   NA 143 03/28/2019   K 4.3 03/28/2019   CL 108 03/28/2019   CO2 26 03/28/2019   GLUCOSE 99 03/28/2019   BUN 17 03/28/2019   CREATININE 0.87 03/28/2019   CALCIUM 9.4 03/28/2019   PROT 7.2 03/28/2019   ALBUMIN 4.0 03/28/2019   AST 12 (L) 03/28/2019   ALT 11 03/28/2019   ALKPHOS 97 03/28/2019   BILITOT 0.3 03/28/2019   GFRNONAA >60 03/28/2019   GFRAA >60 03/28/2019    Lab Results  Component Value Date   CEA1 8.20 (H) 03/15/2019     Medications: I have reviewed the patient's current medications.   Assessment/Plan: 1. Rectal cancer  Colonoscopy 02/15/2019- frond-like/villous, fungating, infiltrative and ulcerated partially obstructing large mass extending from the dentate line into the distal and mid rectum.  Mass partially circumferential.  Mass measured 9 cm in length.  Biopsies showed invasive adenocarcinoma;  preserved expression of the major and minor MMR proteins.    Upper endoscopy 02/15/2019-distal esophagitis, esophageal mucosal changes suspicious for long segment Barrett's esophagus.  Biopsy of the duodenum showed peptic duodenitis with no dysplasia or malignancy; random gastric biopsy showed mild chronic gastritis and no intestinal metaplasia dysplasia or malignancy. Distal esophagus biopsy showed intestinal metaplasia consistent with Barrett's esophagus and no dysplasia or malignancy.  CTs 02/22/2019- wall thickening of the rectum.  A few subcentimeter perirectal lymph nodes.  Small bowel to small bowel intussusception within the jejunum which appears to partially resolve on delayed imaging.  2 mm nonspecific pulmonary nodule superior segment left lower lobe.  03/06/2019 lower endoscopic ultrasound- flex impression-malignant partially obstructing tumor in the proximal rectum and in the mid rectum, internal hemorrhoids.  EUS impression- rectal mass visualized endosonography, staged T3N1.  1 malignant appearing lymph node was visualized in a peritumoral location.  Endosonographic appearance suspicious for metastatic colorectal adenocarcinoma.  Internal anal sphincter was visualized and appeared normal.  No sign of significant pathology at the rectosigmoid junction and in the sigmoid colon.  Prostate noted with calcification within it.  Cycle 1 FOLFOX 03/15/2019  Cycle 2 FOLFOX 03/28/2019  Cycle 3 FOLFOX 04/12/2019 2. Change in bowel habits secondary to # 08 Dec 2018 3. Rectal bleeding secondary to # 08 Dec 2018 4. Weight loss 5. Port-A-Cath placement, Dr. Marcello Moores, 03/08/2019     Disposition: Jeffrey Day has completed 2 cycles of FOLFOX.  He has tolerated chemotherapy well.  The rectal symptoms have improved.  He will complete cycle 3 today.  He will return for office visit and chemotherapy in 2 weeks.  Betsy Coder, MD  04/12/2019  11:21 AM

## 2019-04-12 NOTE — Progress Notes (Signed)
Per Dr. Sherrill: OK to treat w/pulse 105. 

## 2019-04-12 NOTE — Addendum Note (Signed)
Addended by: Betsy Coder B on: 04/12/2019 12:06 PM   Modules accepted: Orders

## 2019-04-12 NOTE — Patient Instructions (Signed)
[facility] Discharge Instructions for Patients Receiving Chemotherapy  Today you received the following chemotherapy agents: Oxaliplatin (Eloxatin), Leucovorin, Fluorouracil (Adrucil, 5-FU)  To help prevent nausea and vomiting after your treatment, we encourage you to take your nausea medication as directed.   If you develop nausea and vomiting that is not controlled by your nausea medication, call the clinic.   BELOW ARE SYMPTOMS THAT SHOULD BE REPORTED IMMEDIATELY:  *FEVER GREATER THAN 100.5 F  *CHILLS WITH OR WITHOUT FEVER  NAUSEA AND VOMITING THAT IS NOT CONTROLLED WITH YOUR NAUSEA MEDICATION  *UNUSUAL SHORTNESS OF BREATH  *UNUSUAL BRUISING OR BLEEDING  TENDERNESS IN MOUTH AND THROAT WITH OR WITHOUT PRESENCE OF ULCERS  *URINARY PROBLEMS  *BOWEL PROBLEMS  UNUSUAL RASH Items with * indicate a potential emergency and should be followed up as soon as possible.  Feel free to call the clinic should you have any questions or concerns. The clinic phone number is [phone].  Please show the CHEMO ALERT CARD at check-in to the Emergency Department and triage nurse.  Coronavirus (COVID-19) Are you at risk?  Are you at risk for the Coronavirus (COVID-19)?  To be considered HIGH RISK for Coronavirus (COVID-19), you have to meet the following criteria:  . Traveled to [place], [place], [place], [place] or [place]; or in the [place] to [place], [place], [place], or [place]; and have fever, cough, and shortness of breath within the last 2 weeks of travel OR . Been in close contact with a person diagnosed with COVID-19 within the last 2 weeks and have fever, cough, and shortness of breath . IF YOU DO NOT MEET THESE CRITERIA, YOU ARE CONSIDERED LOW RISK FOR COVID-19.  What to do if you are HIGH RISK for COVID-19?  . If you are having a medical emergency, call 911. . Seek medical care right away. Before you go to a doctor's office, urgent care or  emergency department, call ahead and tell them about your recent travel, contact with someone diagnosed with COVID-19, and your symptoms. You should receive instructions from your physician's office regarding next steps of care.  . When you arrive at healthcare provider, tell the healthcare staff immediately you have returned from visiting [place], [place], [place], [place] or [place]; or traveled in the [place] to [place], [place], [place], or [place]; in the last two weeks or you have been in close contact with a person diagnosed with COVID-19 in the last 2 weeks.   . Tell the health care staff about your symptoms: fever, cough and shortness of breath. . After you have been seen by a medical provider, you will be either: o Tested for (COVID-19) and discharged home on quarantine except to seek medical care if symptoms worsen, and asked to  - Stay home and avoid contact with others until you get your results (4-5 days)  - Avoid travel on public transportation if possible (such as bus, train, or airplane) or o Sent to the Emergency Department by EMS for evaluation, COVID-19 testing, and possible admission depending on your condition and test results.  What to do if you are LOW RISK for COVID-19?  Reduce your risk of any infection by using the same precautions used for avoiding the common cold or flu:  . Wash your hands often with soap and warm water for at least 20 seconds.  If soap and water are not readily available, use an alcohol-based hand sanitizer with at least 60% alcohol.  .   If coughing or sneezing, cover your mouth and nose by coughing or sneezing into the elbow areas of your shirt or coat, into a tissue or into your sleeve (not your hands). . Avoid shaking hands with others and consider head nods or verbal greetings only. . Avoid touching your eyes, nose, or mouth with unwashed hands.  . Avoid close contact with people who are sick. . Avoid places or events with large numbers  of people in one location, like concerts or sporting events. . Carefully consider travel plans you have or are making. . If you are planning any travel outside or inside the [place], visit the CDC's Travelers' Health webpage for the latest health notices. . If you have some symptoms but not all symptoms, continue to monitor at home and seek medical attention if your symptoms worsen. . If you are having a medical emergency, call 911.   [place] / e-Visit: [email or web address]         [facility]: [facility]: [phone]                   [facility]: [phone]

## 2019-04-12 NOTE — Telephone Encounter (Signed)
Gave avs and calendar ° °

## 2019-04-14 ENCOUNTER — Other Ambulatory Visit: Payer: Self-pay

## 2019-04-14 ENCOUNTER — Inpatient Hospital Stay: Payer: BC Managed Care – PPO

## 2019-04-14 VITALS — BP 122/72 | HR 66 | Temp 98.9°F | Resp 18

## 2019-04-14 DIAGNOSIS — R194 Change in bowel habit: Secondary | ICD-10-CM | POA: Diagnosis not present

## 2019-04-14 DIAGNOSIS — R634 Abnormal weight loss: Secondary | ICD-10-CM | POA: Diagnosis not present

## 2019-04-14 DIAGNOSIS — C20 Malignant neoplasm of rectum: Secondary | ICD-10-CM | POA: Diagnosis not present

## 2019-04-14 DIAGNOSIS — Z5111 Encounter for antineoplastic chemotherapy: Secondary | ICD-10-CM | POA: Diagnosis not present

## 2019-04-14 DIAGNOSIS — K625 Hemorrhage of anus and rectum: Secondary | ICD-10-CM | POA: Diagnosis not present

## 2019-04-14 MED ORDER — HEPARIN SOD (PORK) LOCK FLUSH 100 UNIT/ML IV SOLN
500.0000 [IU] | Freq: Once | INTRAVENOUS | Status: AC
  Administered 2019-04-14: 500 [IU] via INTRAVENOUS
  Filled 2019-04-14: qty 5

## 2019-04-14 MED ORDER — SODIUM CHLORIDE 0.9% FLUSH
10.0000 mL | INTRAVENOUS | Status: DC | PRN
  Administered 2019-04-14: 12:00:00 10 mL via INTRAVENOUS
  Filled 2019-04-14: qty 10

## 2019-04-15 DIAGNOSIS — C20 Malignant neoplasm of rectum: Secondary | ICD-10-CM | POA: Diagnosis not present

## 2019-04-17 ENCOUNTER — Encounter: Payer: Self-pay | Admitting: Oncology

## 2019-04-19 ENCOUNTER — Telehealth: Payer: Self-pay

## 2019-04-19 NOTE — Telephone Encounter (Signed)
TC to patient in regard to non urgent medical question about area on back. I let him know that Lattie Haw NP said that as long as the area is not raised itchy or bothering him then he should be ok and that they will follow up on it when he comes in to see Dr. Benay Spice on the 16th. But if anything changes and it starts to bother him he needs to call us right away.

## 2019-04-20 ENCOUNTER — Ambulatory Visit: Payer: BC Managed Care – PPO | Admitting: Urology

## 2019-04-22 ENCOUNTER — Other Ambulatory Visit: Payer: Self-pay | Admitting: Oncology

## 2019-04-25 ENCOUNTER — Inpatient Hospital Stay (HOSPITAL_BASED_OUTPATIENT_CLINIC_OR_DEPARTMENT_OTHER): Payer: BC Managed Care – PPO | Admitting: Oncology

## 2019-04-25 ENCOUNTER — Other Ambulatory Visit: Payer: Self-pay

## 2019-04-25 ENCOUNTER — Inpatient Hospital Stay: Payer: BC Managed Care – PPO

## 2019-04-25 VITALS — BP 123/88 | HR 65 | Temp 98.5°F | Resp 18 | Ht 69.0 in | Wt 175.8 lb

## 2019-04-25 DIAGNOSIS — C20 Malignant neoplasm of rectum: Secondary | ICD-10-CM

## 2019-04-25 DIAGNOSIS — Z5111 Encounter for antineoplastic chemotherapy: Secondary | ICD-10-CM | POA: Diagnosis not present

## 2019-04-25 DIAGNOSIS — K625 Hemorrhage of anus and rectum: Secondary | ICD-10-CM | POA: Diagnosis not present

## 2019-04-25 DIAGNOSIS — R194 Change in bowel habit: Secondary | ICD-10-CM | POA: Diagnosis not present

## 2019-04-25 DIAGNOSIS — R634 Abnormal weight loss: Secondary | ICD-10-CM | POA: Diagnosis not present

## 2019-04-25 DIAGNOSIS — Z95828 Presence of other vascular implants and grafts: Secondary | ICD-10-CM

## 2019-04-25 LAB — CBC WITH DIFFERENTIAL (CANCER CENTER ONLY)
Abs Immature Granulocytes: 0.01 10*3/uL (ref 0.00–0.07)
Basophils Absolute: 0 10*3/uL (ref 0.0–0.1)
Basophils Relative: 1 %
Eosinophils Absolute: 0.1 10*3/uL (ref 0.0–0.5)
Eosinophils Relative: 2 %
HCT: 38.8 % — ABNORMAL LOW (ref 39.0–52.0)
Hemoglobin: 13.1 g/dL (ref 13.0–17.0)
Immature Granulocytes: 0 %
Lymphocytes Relative: 24 %
Lymphs Abs: 0.9 10*3/uL (ref 0.7–4.0)
MCH: 29.7 pg (ref 26.0–34.0)
MCHC: 33.8 g/dL (ref 30.0–36.0)
MCV: 88 fL (ref 80.0–100.0)
Monocytes Absolute: 0.6 10*3/uL (ref 0.1–1.0)
Monocytes Relative: 16 %
Neutro Abs: 2.1 10*3/uL (ref 1.7–7.7)
Neutrophils Relative %: 57 %
Platelet Count: 157 10*3/uL (ref 150–400)
RBC: 4.41 MIL/uL (ref 4.22–5.81)
RDW: 14.5 % (ref 11.5–15.5)
WBC Count: 3.7 10*3/uL — ABNORMAL LOW (ref 4.0–10.5)
nRBC: 0 % (ref 0.0–0.2)

## 2019-04-25 LAB — CMP (CANCER CENTER ONLY)
ALT: 52 U/L — ABNORMAL HIGH (ref 0–44)
AST: 30 U/L (ref 15–41)
Albumin: 4.2 g/dL (ref 3.5–5.0)
Alkaline Phosphatase: 97 U/L (ref 38–126)
Anion gap: 8 (ref 5–15)
BUN: 16 mg/dL (ref 6–20)
CO2: 28 mmol/L (ref 22–32)
Calcium: 9.6 mg/dL (ref 8.9–10.3)
Chloride: 108 mmol/L (ref 98–111)
Creatinine: 0.87 mg/dL (ref 0.61–1.24)
GFR, Est AFR Am: >60 mL/min (ref >60)
GFR, Estimated: >60 mL/min (ref >60)
Glucose, Bld: 112 mg/dL — ABNORMAL HIGH (ref 70–99)
Potassium: 4.5 mmol/L (ref 3.5–5.1)
Sodium: 144 mmol/L (ref 135–145)
Total Bilirubin: 0.3 mg/dL (ref 0.3–1.2)
Total Protein: 7.2 g/dL (ref 6.5–8.1)

## 2019-04-25 LAB — CEA (IN HOUSE-CHCC): CEA (CHCC-In House): 4.01 ng/mL (ref 0.00–5.00)

## 2019-04-25 MED ORDER — PALONOSETRON HCL INJECTION 0.25 MG/5ML
INTRAVENOUS | Status: AC
  Filled 2019-04-25: qty 5

## 2019-04-25 MED ORDER — PALONOSETRON HCL INJECTION 0.25 MG/5ML
0.2500 mg | Freq: Once | INTRAVENOUS | Status: AC
  Administered 2019-04-25: 10:00:00 0.25 mg via INTRAVENOUS

## 2019-04-25 MED ORDER — LEUCOVORIN CALCIUM INJECTION 350 MG
400.0000 mg/m2 | Freq: Once | INTRAVENOUS | Status: AC
  Administered 2019-04-25: 780 mg via INTRAVENOUS
  Filled 2019-04-25: qty 39

## 2019-04-25 MED ORDER — DEXAMETHASONE SODIUM PHOSPHATE 10 MG/ML IJ SOLN
INTRAMUSCULAR | Status: AC
  Filled 2019-04-25: qty 1

## 2019-04-25 MED ORDER — SODIUM CHLORIDE 0.9% FLUSH
10.0000 mL | INTRAVENOUS | Status: DC | PRN
  Administered 2019-04-25: 09:00:00 10 mL
  Filled 2019-04-25: qty 10

## 2019-04-25 MED ORDER — SODIUM CHLORIDE 0.9 % IV SOLN
2400.0000 mg/m2 | INTRAVENOUS | Status: DC
  Administered 2019-04-25: 13:00:00 4700 mg via INTRAVENOUS
  Filled 2019-04-25: qty 94

## 2019-04-25 MED ORDER — DEXTROSE 5 % IV SOLN
Freq: Once | INTRAVENOUS | Status: AC
  Administered 2019-04-25: 10:00:00 via INTRAVENOUS
  Filled 2019-04-25: qty 250

## 2019-04-25 MED ORDER — FLUOROURACIL CHEMO INJECTION 2.5 GM/50ML
400.0000 mg/m2 | Freq: Once | INTRAVENOUS | Status: AC
  Administered 2019-04-25: 800 mg via INTRAVENOUS
  Filled 2019-04-25: qty 16

## 2019-04-25 MED ORDER — DEXAMETHASONE SODIUM PHOSPHATE 10 MG/ML IJ SOLN
10.0000 mg | Freq: Once | INTRAMUSCULAR | Status: AC
  Administered 2019-04-25: 10 mg via INTRAVENOUS

## 2019-04-25 MED ORDER — DEXTROSE 5 % IV SOLN
Freq: Once | INTRAVENOUS | Status: AC
  Administered 2019-04-25: 11:00:00 via INTRAVENOUS
  Filled 2019-04-25: qty 250

## 2019-04-25 MED ORDER — OXALIPLATIN CHEMO INJECTION 100 MG/20ML
85.0000 mg/m2 | Freq: Once | INTRAVENOUS | Status: AC
  Administered 2019-04-25: 165 mg via INTRAVENOUS
  Filled 2019-04-25: qty 33

## 2019-04-25 NOTE — Patient Instructions (Signed)
[facility] Discharge Instructions for Patients Receiving Chemotherapy  Today you received the following chemotherapy agents Oxaliplatin, Leucovorin, 5FU  To help prevent nausea and vomiting after your treatment, we encourage you to take your nausea medication as directed   If you develop nausea and vomiting that is not controlled by your nausea medication, call the clinic.   BELOW ARE SYMPTOMS THAT SHOULD BE REPORTED IMMEDIATELY:  *FEVER GREATER THAN 100.5 F  *CHILLS WITH OR WITHOUT FEVER  NAUSEA AND VOMITING THAT IS NOT CONTROLLED WITH YOUR NAUSEA MEDICATION  *UNUSUAL SHORTNESS OF BREATH  *UNUSUAL BRUISING OR BLEEDING  TENDERNESS IN MOUTH AND THROAT WITH OR WITHOUT PRESENCE OF ULCERS  *URINARY PROBLEMS  *BOWEL PROBLEMS  UNUSUAL RASH Items with * indicate a potential emergency and should be followed up as soon as possible.  Feel free to call the clinic should you have any questions or concerns. The clinic phone number is [phone].  Please show the CHEMO ALERT CARD at check-in to the Emergency Department and triage nurse.   

## 2019-04-25 NOTE — Progress Notes (Signed)
[facility] OFFICE PROGRESS NOTE   Diagnosis: Rectal cancer  INTERVAL HISTORY:   Jeffrey Day completed another cycle of FOLFOX on 04/12/2019.  No nausea/vomiting, mouth sores, or diarrhea.  He had cold sensitivity following chemotherapy.  This has resolved.  He noted a white coat on the tongue following chemotherapy.  No rectal bleeding.  Objective:  Vital signs in last 24 hours:  Blood pressure 123/88, pulse 65, temperature 98.5 F (36.9 C), resp. rate 18, height '5\' 9"'$  (1.753 m), weight 175 lb 12.8 oz (79.7 kg), SpO2 100 %.    HEENT: Mild white coat over the tongue, no buccal thrush or ulcers GI: Nontender, no hepatomegaly Vascular: No leg edema  Skin: Palms without erythema, demarcated areas of flat light brown hyperpigmentation at the mid upper back  Portacath/PICC-without erythema  Lab Results:  Lab Results  Component Value Date   WBC 3.7 (L) 04/25/2019   HGB 13.1 04/25/2019   HCT 38.8 (L) 04/25/2019   MCV 88.0 04/25/2019   PLT 157 04/25/2019   NEUTROABS 2.1 04/25/2019    CMP  Lab Results  Component Value Date   NA 144 04/25/2019   K 4.5 04/25/2019   CL 108 04/25/2019   CO2 28 04/25/2019   GLUCOSE 112 (H) 04/25/2019   BUN 16 04/25/2019   CREATININE 0.87 04/25/2019   CALCIUM 9.6 04/25/2019   PROT 7.2 04/25/2019   ALBUMIN 4.2 04/25/2019   AST 30 04/25/2019   ALT 52 (H) 04/25/2019   ALKPHOS 97 04/25/2019   BILITOT 0.3 04/25/2019   GFRNONAA >60 04/25/2019   GFRAA >60 04/25/2019    Lab Results  Component Value Date   CEA1 4.01 04/25/2019    Medications: I have reviewed the patient's current medications.   Assessment/Plan: 1. Rectal cancer  Colonoscopy 02/15/2019- frond-like/villous, fungating, infiltrative and ulcerated partially obstructing large mass extending from the dentate line into the distal and mid rectum.  Mass partially circumferential.  Mass measured 9 cm in length.  Biopsies showed invasive adenocarcinoma; preserved  expression of the major and minor MMR proteins.    Upper endoscopy 02/15/2019-distal esophagitis, esophageal mucosal changes suspicious for long segment Barrett's esophagus.  Biopsy of the duodenum showed peptic duodenitis with no dysplasia or malignancy; random gastric biopsy showed mild chronic gastritis and no intestinal metaplasia dysplasia or malignancy. Distal esophagus biopsy showed intestinal metaplasia consistent with Barrett's esophagus and no dysplasia or malignancy.  CTs 02/22/2019- wall thickening of the rectum.  A few subcentimeter perirectal lymph nodes.  Small bowel to small bowel intussusception within the jejunum which appears to partially resolve on delayed imaging.  2 mm nonspecific pulmonary nodule superior segment left lower lobe.  03/06/2019 lower endoscopic ultrasound- flex impression-malignant partially obstructing tumor in the proximal rectum and in the mid rectum, internal hemorrhoids.  EUS impression- rectal mass visualized endosonography, staged T3N1.  1 malignant appearing lymph node was visualized in a peritumoral location.  Endosonographic appearance suspicious for metastatic colorectal adenocarcinoma.  Internal anal sphincter was visualized and appeared normal.  No sign of significant pathology at the rectosigmoid junction and in the sigmoid colon.  Prostate noted with calcification within it.  Cycle 1 FOLFOX 03/15/2019  Cycle 2 FOLFOX 03/28/2019  Cycle 3 FOLFOX 04/12/2019  Cycle 4 FOLFOX 04/25/2019 2. Change in bowel habits secondary to # 08 Dec 2018 3. Rectal bleeding secondary to # 08 Dec 2018 4. Weight loss 5. Port-A-Cath placement, Dr. Marcello Moores, 03/08/2019      Disposition: Jeffrey Day has completed 3 cycles of  FOLFOX.  He has tolerated the chemotherapy well and the rectal symptoms are improved.  He will complete cycle 4 FOLFOX today.  The areas of hyperpigmentation at the upper back may be related to a yeast infection, 5-fluorouracil, or psoriasis.  The plan is to  monitor this for now.  Jeffrey Day will return for an office visit and chemotherapy in 2 weeks.  Betsy Coder, MD  04/25/2019  3:57 PM

## 2019-04-25 NOTE — Patient Instructions (Signed)

## 2019-04-26 ENCOUNTER — Telehealth: Payer: Self-pay | Admitting: Oncology

## 2019-04-26 NOTE — Telephone Encounter (Signed)
Called and left msg. Mailed printout  °

## 2019-04-27 ENCOUNTER — Inpatient Hospital Stay: Payer: BC Managed Care – PPO

## 2019-04-27 ENCOUNTER — Other Ambulatory Visit: Payer: Self-pay

## 2019-04-27 VITALS — BP 122/78 | HR 62 | Temp 98.5°F | Resp 18

## 2019-04-27 DIAGNOSIS — C20 Malignant neoplasm of rectum: Secondary | ICD-10-CM

## 2019-04-27 MED ORDER — HEPARIN SOD (PORK) LOCK FLUSH 100 UNIT/ML IV SOLN
500.0000 [IU] | Freq: Once | INTRAVENOUS | Status: DC | PRN
  Filled 2019-04-27: qty 5

## 2019-04-27 MED ORDER — SODIUM CHLORIDE 0.9% FLUSH
10.0000 mL | INTRAVENOUS | Status: DC | PRN
  Filled 2019-04-27: qty 10

## 2019-05-06 ENCOUNTER — Other Ambulatory Visit: Payer: Self-pay | Admitting: Oncology

## 2019-05-10 ENCOUNTER — Other Ambulatory Visit: Payer: Self-pay

## 2019-05-10 ENCOUNTER — Inpatient Hospital Stay: Payer: BC Managed Care – PPO

## 2019-05-10 ENCOUNTER — Inpatient Hospital Stay: Payer: BC Managed Care – PPO | Attending: Nurse Practitioner | Admitting: Nurse Practitioner

## 2019-05-10 ENCOUNTER — Encounter: Payer: Self-pay | Admitting: Nurse Practitioner

## 2019-05-10 VITALS — BP 115/84 | HR 63 | Temp 98.5°F | Resp 16 | Ht 69.0 in | Wt 178.2 lb

## 2019-05-10 DIAGNOSIS — Z95828 Presence of other vascular implants and grafts: Secondary | ICD-10-CM

## 2019-05-10 DIAGNOSIS — C20 Malignant neoplasm of rectum: Secondary | ICD-10-CM | POA: Insufficient documentation

## 2019-05-10 DIAGNOSIS — R634 Abnormal weight loss: Secondary | ICD-10-CM | POA: Diagnosis not present

## 2019-05-10 DIAGNOSIS — Z5111 Encounter for antineoplastic chemotherapy: Secondary | ICD-10-CM | POA: Insufficient documentation

## 2019-05-10 DIAGNOSIS — Z79899 Other long term (current) drug therapy: Secondary | ICD-10-CM | POA: Insufficient documentation

## 2019-05-10 DIAGNOSIS — Z23 Encounter for immunization: Secondary | ICD-10-CM | POA: Diagnosis not present

## 2019-05-10 DIAGNOSIS — Z7689 Persons encountering health services in other specified circumstances: Secondary | ICD-10-CM | POA: Insufficient documentation

## 2019-05-10 LAB — CMP (CANCER CENTER ONLY)
ALT: 129 U/L — ABNORMAL HIGH (ref 0–44)
AST: 60 U/L — ABNORMAL HIGH (ref 15–41)
Albumin: 4 g/dL (ref 3.5–5.0)
Alkaline Phosphatase: 126 U/L (ref 38–126)
Anion gap: 8 (ref 5–15)
BUN: 12 mg/dL (ref 6–20)
CO2: 28 mmol/L (ref 22–32)
Calcium: 9.6 mg/dL (ref 8.9–10.3)
Chloride: 106 mmol/L (ref 98–111)
Creatinine: 0.94 mg/dL (ref 0.61–1.24)
GFR, Est AFR Am: >60 mL/min (ref >60)
GFR, Estimated: >60 mL/min (ref >60)
Glucose, Bld: 117 mg/dL — ABNORMAL HIGH (ref 70–99)
Potassium: 4.1 mmol/L (ref 3.5–5.1)
Sodium: 142 mmol/L (ref 135–145)
Total Bilirubin: 0.4 mg/dL (ref 0.3–1.2)
Total Protein: 7.2 g/dL (ref 6.5–8.1)

## 2019-05-10 LAB — CBC WITH DIFFERENTIAL (CANCER CENTER ONLY)
Abs Immature Granulocytes: 0 10*3/uL (ref 0.00–0.07)
Basophils Absolute: 0.1 10*3/uL (ref 0.0–0.1)
Basophils Relative: 2 %
Eosinophils Absolute: 0 10*3/uL (ref 0.0–0.5)
Eosinophils Relative: 1 %
HCT: 38.9 % — ABNORMAL LOW (ref 39.0–52.0)
Hemoglobin: 13.2 g/dL (ref 13.0–17.0)
Immature Granulocytes: 0 %
Lymphocytes Relative: 37 %
Lymphs Abs: 1 10*3/uL (ref 0.7–4.0)
MCH: 30.7 pg (ref 26.0–34.0)
MCHC: 33.9 g/dL (ref 30.0–36.0)
MCV: 90.5 fL (ref 80.0–100.0)
Monocytes Absolute: 0.5 10*3/uL (ref 0.1–1.0)
Monocytes Relative: 19 %
Neutro Abs: 1.1 10*3/uL — ABNORMAL LOW (ref 1.7–7.7)
Neutrophils Relative %: 41 %
Platelet Count: 154 10*3/uL (ref 150–400)
RBC: 4.3 MIL/uL (ref 4.22–5.81)
RDW: 16.1 % — ABNORMAL HIGH (ref 11.5–15.5)
WBC Count: 2.7 10*3/uL — ABNORMAL LOW (ref 4.0–10.5)
nRBC: 0 % (ref 0.0–0.2)

## 2019-05-10 MED ORDER — HEPARIN SOD (PORK) LOCK FLUSH 100 UNIT/ML IV SOLN
500.0000 [IU] | Freq: Once | INTRAVENOUS | Status: AC | PRN
  Administered 2019-05-10: 500 [IU]
  Filled 2019-05-10: qty 5

## 2019-05-10 MED ORDER — SODIUM CHLORIDE 0.9% FLUSH
10.0000 mL | INTRAVENOUS | Status: DC | PRN
  Administered 2019-05-10: 10 mL
  Filled 2019-05-10: qty 10

## 2019-05-10 NOTE — Progress Notes (Addendum)
[facility] OFFICE PROGRESS NOTE   Diagnosis: Rectal cancer  INTERVAL HISTORY:   Jeffrey Day returns as scheduled.  He completed cycle 4 FOLFOX 04/25/2019.  He denies nausea/vomiting.  No mouth sores.  No diarrhea.  Bowel movements are "near normal".  No pain or bleeding with bowel movements.  Cold sensitivity lasted about 2 days.  He reports a good appetite.  Objective:  Vital signs in last 24 hours:  Blood pressure 115/84, pulse 63, temperature 98.5 F (36.9 C), temperature source Temporal, resp. rate 16, height '5\' 9"'$  (1.753 m), weight 178 lb 3.2 oz (80.8 kg), SpO2 100 %.    HEENT: No thrush or ulcers. GI: Abdomen soft and nontender.  No hepatomegaly. Vascular: No leg edema. Neuro: Vibratory sense intact over the fingertips per tuning fork exam. Skin: Palms without erythema.  Scattered areas of flat light brown hyperpigmentation at the mid to upper back. Port-A-Cath without erythema.   Lab Results:  Lab Results  Component Value Date   WBC 2.7 (L) 05/10/2019   HGB 13.2 05/10/2019   HCT 38.9 (L) 05/10/2019   MCV 90.5 05/10/2019   PLT 154 05/10/2019   NEUTROABS 1.1 (L) 05/10/2019    Imaging:  No results found.  Medications: I have reviewed the patient's current medications.  Assessment/Plan: 1. Rectal cancer  Colonoscopy 02/15/2019-frond-like/villous, fungating, infiltrative and ulcerated partially obstructing large mass extending from the dentate line into the distal and mid rectum. Mass partially circumferential. Mass measured 9 cm in length. Biopsies showed invasive adenocarcinoma; preserved expression of the major and minor MMR proteins.   Upper endoscopy 02/15/2019-distal esophagitis, esophageal mucosal changes suspicious for long segment Barrett's esophagus. Biopsy of the duodenum showed peptic duodenitis with no dysplasia or malignancy; random gastric biopsy showed mild chronic gastritis and no intestinal metaplasia dysplasia or malignancy.  Distal esophagus biopsy showed intestinal metaplasia consistent with Barrett's esophagus and no dysplasia or malignancy.  CTs 02/22/2019-wall thickening of the rectum. A few subcentimeter perirectal lymph nodes. Small bowel to small bowel intussusception within the jejunum which appears to partially resolve on delayed imaging. 2 mm nonspecific pulmonary nodule superior segment left lower lobe.  03/06/2019 lower endoscopic ultrasound- flex impression-malignant partially obstructing tumor in the proximal rectum and in the mid rectum, internal hemorrhoids.  EUS impression- rectal mass visualized endosonography, staged T3N1.  1 malignant appearing lymph node was visualized in a peritumoral location.  Endosonographic appearance suspicious for metastatic colorectal adenocarcinoma.  Internal anal sphincter was visualized and appeared normal.  No sign of significant pathology at the rectosigmoid junction and in the sigmoid colon.  Prostate noted with calcification within it.  Cycle 1 FOLFOX 03/15/2019  Cycle 2 FOLFOX 03/28/2019  Cycle 3 FOLFOX 04/12/2019  Cycle 4 FOLFOX 04/25/2019 2. Change in bowel habits secondary to#08 Dec 2018 3. Rectal bleeding secondary to#08 Dec 2018 4. Weight loss 5. Port-A-Cath placement, Dr. Marcello Moores, 03/08/2019  Disposition: Mr. Degregory appears stable.  He has completed 4 cycles of FOLFOX.  He continues to tolerate the chemotherapy well.  We reviewed the CBC from today.  He has neutropenia.  We discussed neutropenic precautions.  He understands to contact the office with fever, chills, other signs of infection.  We were unable to get approval from his insurance company to add [place] with this cycle.  We decided to hold today's treatment and reschedule for 1 week with the plan to add Udenyca on the day of pump discontinuation.  We reviewed potential toxicities associated with Udenyca including bone pain, rash, splenic rupture.  He agrees with this plan.  He will return for lab,  follow-up, FOLFOX in 1 week.  He will contact the office in the interim as outlined above or with any other problems.  Liver enzyme elevation is likely related to chemotherapy.  Patient seen with Dr. Benay Spice.  25 minutes were spent face-to-face at today's visit with the majority of that time involved in counseling/coordination of care.    [name] ANP/GNP-BC   05/10/2019  10:23 AM This was a shared visit with [name].  Mr. Eudy has mild neutropenia.  We discussed the risk of infection.  We discussed the addition of G-CSF support including potential toxicities.  We were unable to get insurance approval while he was in the office today.  The next cycle of chemotherapy will be delayed for 1 week.  Julieanne Manson, MD

## 2019-05-15 DIAGNOSIS — C20 Malignant neoplasm of rectum: Secondary | ICD-10-CM | POA: Diagnosis not present

## 2019-05-16 NOTE — Progress Notes (Signed)
[place]   [phone] [phone]   Clinic Follow up Note   Patient Care Team: Vivi Barrack, MD as PCP - General (Family Medicine) Arna Snipe, RN as Oncology Nurse Navigator Leighton Ruff, MD as Consulting Physician (General Surgery) Ladell Pier, MD as Consulting Physician (Oncology) 05/17/2019  CHIEF COMPLAINT: F/u rectal cancer  CURRENT THERAPY: FOLFOX  INTERVAL HISTORY: Jeffrey Day returns for f/u and treatment as scheduled. He completed cycle 4 on 04/25/19. Cycle 5 was delayed due to neutropenia, ANC 1.1 on 10/1. He feels well today. Denies changes over the last week. Eating and drinking well. Denies mucositis. No n/v/c/d. Denies rectal pain, pressure, or bleeding. BM once daily. Cold sensitivity and tingling lasts longer but eventually resolved, no other neuropathy. He is working without difficulty. He noticed an "abrasion" at port site when he removed the bandaid, no swelling, discharge, or pain.    MEDICAL HISTORY:  Past Medical History:  Diagnosis Date  . Allergy   . Rectal cancer [facility])     SURGICAL HISTORY: Past Surgical History:  Procedure Laterality Date  . COLONOSCOPY WITH ESOPHAGOGASTRODUODENOSCOPY (EGD)     biopsy  . EUS N/A 03/06/2019   Procedure: LOWER ENDOSCOPIC ULTRASOUND (EUS);  Surgeon: Irving Copas., MD;  Location: [facility];  Service: Gastroenterology;  Laterality: N/A;  . FINGER SURGERY  2013   Traumatic Amputation   . FLEXIBLE SIGMOIDOSCOPY N/A 03/06/2019   Procedure: FLEXIBLE SIGMOIDOSCOPY;  Surgeon: Rush Landmark Telford Nab., MD;  Location: [facility];  Service: Gastroenterology;  Laterality: N/A;  . PORTACATH PLACEMENT N/A 03/08/2019   Procedure: INSERTION PORT-A-CATH WITH ULTRASOUND GUIDANCE;  Surgeon: Leighton Ruff, MD;  Location: [facility];  Service: General;  Laterality: N/A;  LMA    I have reviewed the social history and family history with the patient and they are unchanged from previous  note.  ALLERGIES:  is allergic to penicillins.  MEDICATIONS:  Current Outpatient Medications  Medication Sig Dispense Refill  . acetaminophen (TYLENOL) 325 MG tablet Take 650 mg by mouth every 6 (six) hours as needed.    Marland Kitchen omeprazole (PRILOSEC) 40 MG capsule Take 1 capsule (40 mg total) by mouth 2 (two) times daily at 8 am and 10 pm. (Patient taking differently: Take 40 mg by mouth 2 (two) times a day. ) 60 capsule 6  . polyethylene glycol (MIRALAX / GLYCOLAX) 17 g packet Take 17 g by mouth daily.    . prochlorperazine (COMPAZINE) 10 MG tablet Take 1 tablet (10 mg total) by mouth every 6 (six) hours as needed for nausea or vomiting. 30 tablet 0   No current facility-administered medications for this visit.    Facility-Administered Medications Ordered in Other Visits  Medication Dose Route Frequency Provider Last Rate Last Dose  . fluorouracil (ADRUCIL) 4,700 mg in sodium chloride 0.9 % 56 mL chemo infusion  2,400 mg/m2 (Treatment Plan Recorded) Intravenous 1 day or 1 dose Ladell Pier, MD      . fluorouracil (ADRUCIL) chemo injection 800 mg  400 mg/m2 (Treatment Plan Recorded) Intravenous Once Ladell Pier, MD      . influenza vac split quadrivalent PF (FLUARIX) injection 0.5 mL  0.5 mL Intramuscular Once Ladell Pier, MD      . leucovorin 780 mg in dextrose 5 % 250 mL infusion  400 mg/m2 (Treatment Plan Recorded) Intravenous Once Ladell Pier, MD      . oxaliplatin (ELOXATIN) 165 mg in dextrose 5 % 500 mL chemo infusion  85 mg/m2 (Treatment Plan Recorded) Intravenous Once Ladell Pier, MD        PHYSICAL EXAMINATION: ECOG PERFORMANCE STATUS: 0 - Asymptomatic  Vitals:   05/17/19 0913  BP: 115/79  Pulse: 77  Resp: 18  Temp: 98.9 F (37.2 C)  SpO2: 99%   Filed Weights   05/17/19 0913  Weight: 179 lb 3.2 oz (81.3 kg)    GENERAL:alert, no distress and comfortable SKIN: no rash  EYES: sclera clear OROPHARYNX: no thrush or ulcers NECK: without mass  LUNGS:  clear with normal breathing effort HEART: regular rate & rhythm, no lower extremity edema ABDOMEN:abdomen soft, non-tender and normal bowel sounds Musculoskeletal:no cyanosis of digits NEURO: alert & oriented x 3 with fluent speech, normal gait PAC with mild erythema along the incision with tiny focus of erythema below the incision; no surrounding edema, drainage, or tenderness   LABORATORY DATA:  I have reviewed the data as listed CBC Latest Ref Rng & Units 05/17/2019 05/10/2019 04/25/2019  WBC 4.0 - 10.5 K/uL 3.9(L) 2.7(L) 3.7(L)  Hemoglobin 13.0 - 17.0 g/dL 14.2 13.2 13.1  Hematocrit 39.0 - 52.0 % 41.3 38.9(L) 38.8(L)  Platelets 150 - 400 K/uL 257 154 157     CMP Latest Ref Rng & Units 05/17/2019 05/10/2019 04/25/2019  Glucose 70 - 99 mg/dL 127(H) 117(H) 112(H)  BUN 6 - 20 mg/dL _0 Creatinine 0.61 - 1.24 mg/dL 0.87 0.94 0.87  Sodium 135 - 145 mmol/L 140 142 144  Potassium 3.5 - 5.1 mmol/L 4.5 4.1 4.5  Chloride 98 - 111 mmol/L 105 106 108  CO2 22 - 32 mmol/L _1 Calcium 8.9 - 10.3 mg/dL 9.6 9.6 9.6  Total Protein 6.5 - 8.1 g/dL 8.0 7.2 7.2  Total Bilirubin 0.3 - 1.2 mg/dL 0.4 0.4 0.3  Alkaline Phos 38 - 126 U/L 132(H) 126 97  AST 15 - 41 U/L 46(H) 60(H) 30  ALT 0 - 44 U/L 79(H) 129(H) 52(H)      RADIOGRAPHIC STUDIES: I have personally reviewed the radiological images as listed and agreed with the findings in the report. No results found.   ASSESSMENT & PLAN:  1. Rectal cancer  Colonoscopy 02/15/2019-frond-like/villous, fungating, infiltrative and ulcerated partially obstructing large mass extending from the dentate line into the distal and mid rectum. Mass partially circumferential. Mass measured 9 cm in length. Biopsies showed invasive adenocarcinoma; preserved expression of the major and minor MMR proteins.   Upper endoscopy 02/15/2019-distal esophagitis, esophageal mucosal changes suspicious for long segment Barrett's esophagus. Biopsy of the duodenum showed  peptic duodenitis with no dysplasia or malignancy; random gastric biopsy showed mild chronic gastritis and no intestinal metaplasia dysplasia or malignancy. Distal esophagus biopsy showed intestinal metaplasia consistent with Barrett's esophagus and no dysplasia or malignancy.  CTs 02/22/2019-wall thickening of the rectum. A few subcentimeter perirectal lymph nodes. Small bowel to small bowel intussusception within the jejunum which appears to partially resolve on delayed imaging. 2 mm nonspecific pulmonary nodule superior segment left lower lobe.  03/06/2019 lower endoscopic ultrasound-flex impression-malignant partially obstructing tumor in the proximal rectum and in the mid rectum, internal hemorrhoids. EUS impression-rectal mass visualized endosonography, staged T3N1. 1 malignant appearing lymph node was visualized in a peritumoral location. Endosonographic appearance suspicious for metastatic colorectal adenocarcinoma. Internal anal sphincter was visualized and appeared normal. No sign of significant pathology at the rectosigmoid junction and in the sigmoid colon. Prostate noted with calcification within it.  Cycle 1 FOLFOX 03/15/2019  Cycle 2 FOLFOX 03/28/2019  Cycle 3 FOLFOX 04/12/2019  Cycle 4 FOLFOX 04/25/2019  CYcle 5 FOLFOX 05/17/2019, added GCSF 2. Change in bowel habits secondary to#08 Dec 2018 3. Rectal bleeding secondary to#08 Dec 2018 4. Weight loss 5. Port-A-Cath placement, Dr. Marcello Moores, 03/08/2019  Disposition: Mr. Tuckey appears stable. He completed 4 cycles of FOLFOX. He tolerated treatment well except mild cold sensitivity. Cycle 5 was postponed due to neutropenia. Labs reviewed. ANC normalized. LFTs remain elevated, likely treatment related, but stable. He will proceed with cycle 5 FOLFOX today. Will add GCSF on day 3 with pump d/c. We reviewed potential side effects including bone pain and rare but serious splenic rupture. He agrees to proceed. I recommend taking  claritin to reduce bone pain. He will return for f/u and cycle 6 in 2 weeks. I reviewed the plan with Dr. Benay Spice.   His PAC incision is mildly erythematous, with what appears to be a tiny skin tear from the bandaid; no other signs of infection. I recommend to apply topical antibiotic after dressing removal BID for a few days and keep clean. He will notify the clinic if he develops fever, chills, or new signs of infection.   All questions were answered. The patient knows to call the clinic with any problems, questions or concerns. No barriers to learning was detected.     Alla Feeling, NP 05/17/19

## 2019-05-17 ENCOUNTER — Inpatient Hospital Stay: Payer: BC Managed Care – PPO

## 2019-05-17 ENCOUNTER — Inpatient Hospital Stay (HOSPITAL_BASED_OUTPATIENT_CLINIC_OR_DEPARTMENT_OTHER): Payer: BC Managed Care – PPO | Admitting: Nurse Practitioner

## 2019-05-17 ENCOUNTER — Telehealth: Payer: Self-pay | Admitting: Nurse Practitioner

## 2019-05-17 ENCOUNTER — Other Ambulatory Visit: Payer: Self-pay

## 2019-05-17 ENCOUNTER — Encounter: Payer: Self-pay | Admitting: Nurse Practitioner

## 2019-05-17 VITALS — BP 115/79 | HR 77 | Temp 98.9°F | Resp 18 | Ht 69.0 in | Wt 179.2 lb

## 2019-05-17 DIAGNOSIS — Z23 Encounter for immunization: Secondary | ICD-10-CM | POA: Diagnosis not present

## 2019-05-17 DIAGNOSIS — C20 Malignant neoplasm of rectum: Secondary | ICD-10-CM

## 2019-05-17 DIAGNOSIS — Z5111 Encounter for antineoplastic chemotherapy: Secondary | ICD-10-CM | POA: Diagnosis not present

## 2019-05-17 DIAGNOSIS — R634 Abnormal weight loss: Secondary | ICD-10-CM | POA: Diagnosis not present

## 2019-05-17 DIAGNOSIS — Z7689 Persons encountering health services in other specified circumstances: Secondary | ICD-10-CM | POA: Diagnosis not present

## 2019-05-17 DIAGNOSIS — Z95828 Presence of other vascular implants and grafts: Secondary | ICD-10-CM

## 2019-05-17 DIAGNOSIS — Z79899 Other long term (current) drug therapy: Secondary | ICD-10-CM | POA: Diagnosis not present

## 2019-05-17 LAB — CMP (CANCER CENTER ONLY)
ALT: 79 U/L — ABNORMAL HIGH (ref 0–44)
AST: 46 U/L — ABNORMAL HIGH (ref 15–41)
Albumin: 4.2 g/dL (ref 3.5–5.0)
Alkaline Phosphatase: 132 U/L — ABNORMAL HIGH (ref 38–126)
Anion gap: 10 (ref 5–15)
BUN: 10 mg/dL (ref 6–20)
CO2: 25 mmol/L (ref 22–32)
Calcium: 9.6 mg/dL (ref 8.9–10.3)
Chloride: 105 mmol/L (ref 98–111)
Creatinine: 0.87 mg/dL (ref 0.61–1.24)
GFR, Est AFR Am: >60 mL/min (ref >60)
GFR, Estimated: >60 mL/min (ref >60)
Glucose, Bld: 127 mg/dL — ABNORMAL HIGH (ref 70–99)
Potassium: 4.5 mmol/L (ref 3.5–5.1)
Sodium: 140 mmol/L (ref 135–145)
Total Bilirubin: 0.4 mg/dL (ref 0.3–1.2)
Total Protein: 8 g/dL (ref 6.5–8.1)

## 2019-05-17 LAB — CBC WITH DIFFERENTIAL (CANCER CENTER ONLY)
Abs Immature Granulocytes: 0.03 10*3/uL (ref 0.00–0.07)
Basophils Absolute: 0.1 10*3/uL (ref 0.0–0.1)
Basophils Relative: 2 %
Eosinophils Absolute: 0 10*3/uL (ref 0.0–0.5)
Eosinophils Relative: 1 %
HCT: 41.3 % (ref 39.0–52.0)
Hemoglobin: 14.2 g/dL (ref 13.0–17.0)
Immature Granulocytes: 1 %
Lymphocytes Relative: 29 %
Lymphs Abs: 1.1 10*3/uL (ref 0.7–4.0)
MCH: 31.3 pg (ref 26.0–34.0)
MCHC: 34.4 g/dL (ref 30.0–36.0)
MCV: 91 fL (ref 80.0–100.0)
Monocytes Absolute: 0.7 10*3/uL (ref 0.1–1.0)
Monocytes Relative: 19 %
Neutro Abs: 1.9 10*3/uL (ref 1.7–7.7)
Neutrophils Relative %: 48 %
Platelet Count: 257 10*3/uL (ref 150–400)
RBC: 4.54 MIL/uL (ref 4.22–5.81)
RDW: 16.1 % — ABNORMAL HIGH (ref 11.5–15.5)
WBC Count: 3.9 10*3/uL — ABNORMAL LOW (ref 4.0–10.5)
nRBC: 0 % (ref 0.0–0.2)

## 2019-05-17 MED ORDER — DEXTROSE 5 % IV SOLN
Freq: Once | INTRAVENOUS | Status: AC
  Administered 2019-05-17: 10:00:00 via INTRAVENOUS
  Filled 2019-05-17: qty 250

## 2019-05-17 MED ORDER — PALONOSETRON HCL INJECTION 0.25 MG/5ML
0.2500 mg | Freq: Once | INTRAVENOUS | Status: AC
  Administered 2019-05-17: 0.25 mg via INTRAVENOUS

## 2019-05-17 MED ORDER — DEXAMETHASONE SODIUM PHOSPHATE 10 MG/ML IJ SOLN
10.0000 mg | Freq: Once | INTRAMUSCULAR | Status: AC
  Administered 2019-05-17: 10 mg via INTRAVENOUS

## 2019-05-17 MED ORDER — SODIUM CHLORIDE 0.9 % IV SOLN
2400.0000 mg/m2 | INTRAVENOUS | Status: DC
  Administered 2019-05-17: 4700 mg via INTRAVENOUS
  Filled 2019-05-17: qty 94

## 2019-05-17 MED ORDER — FLUOROURACIL CHEMO INJECTION 2.5 GM/50ML
400.0000 mg/m2 | Freq: Once | INTRAVENOUS | Status: AC
  Administered 2019-05-17: 800 mg via INTRAVENOUS
  Filled 2019-05-17: qty 16

## 2019-05-17 MED ORDER — LEUCOVORIN CALCIUM INJECTION 350 MG
400.0000 mg/m2 | Freq: Once | INTRAVENOUS | Status: AC
  Administered 2019-05-17: 780 mg via INTRAVENOUS
  Filled 2019-05-17: qty 39

## 2019-05-17 MED ORDER — DEXAMETHASONE SODIUM PHOSPHATE 10 MG/ML IJ SOLN
INTRAMUSCULAR | Status: AC
  Filled 2019-05-17: qty 1

## 2019-05-17 MED ORDER — SODIUM CHLORIDE 0.9% FLUSH
10.0000 mL | INTRAVENOUS | Status: DC | PRN
  Administered 2019-05-17: 09:00:00 10 mL
  Filled 2019-05-17: qty 10

## 2019-05-17 MED ORDER — INFLUENZA VAC SPLIT QUAD 0.5 ML IM SUSY
0.5000 mL | PREFILLED_SYRINGE | Freq: Once | INTRAMUSCULAR | Status: AC
  Administered 2019-05-17: 0.5 mL via INTRAMUSCULAR

## 2019-05-17 MED ORDER — INFLUENZA VAC SPLIT QUAD 0.5 ML IM SUSY
PREFILLED_SYRINGE | INTRAMUSCULAR | Status: AC
  Filled 2019-05-17: qty 0.5

## 2019-05-17 MED ORDER — OXALIPLATIN CHEMO INJECTION 100 MG/20ML
85.0000 mg/m2 | Freq: Once | INTRAVENOUS | Status: AC
  Administered 2019-05-17: 165 mg via INTRAVENOUS
  Filled 2019-05-17: qty 33

## 2019-05-17 MED ORDER — PALONOSETRON HCL INJECTION 0.25 MG/5ML
INTRAVENOUS | Status: AC
  Filled 2019-05-17: qty 5

## 2019-05-17 NOTE — Patient Instructions (Signed)
[facility] Discharge Instructions for Patients Receiving Chemotherapy  Today you received the following chemotherapy agents: Oxaliplatin, leucovorin, 5FU   To help prevent nausea and vomiting after your treatment, we encourage you to take your nausea medication as directed.    If you develop nausea and vomiting that is not controlled by your nausea medication, call the clinic.   BELOW ARE SYMPTOMS THAT SHOULD BE REPORTED IMMEDIATELY:  *FEVER GREATER THAN 100.5 F  *CHILLS WITH OR WITHOUT FEVER  NAUSEA AND VOMITING THAT IS NOT CONTROLLED WITH YOUR NAUSEA MEDICATION  *UNUSUAL SHORTNESS OF BREATH  *UNUSUAL BRUISING OR BLEEDING  TENDERNESS IN MOUTH AND THROAT WITH OR WITHOUT PRESENCE OF ULCERS  *URINARY PROBLEMS  *BOWEL PROBLEMS  UNUSUAL RASH Items with * indicate a potential emergency and should be followed up as soon as possible.  Feel free to call the clinic should you have any questions or concerns. The clinic phone number is [phone].  Please show the CHEMO ALERT CARD at check-in to the Emergency Department and triage nurse.   

## 2019-05-17 NOTE — Telephone Encounter (Signed)
Scheduled appt per 10/8 los.  Sent treatment as an add-on for approval.  Will contact pt with appt time and date once treatment has been added.

## 2019-05-17 NOTE — Telephone Encounter (Signed)
Called and left a Vm of the appt date and time of scheduled appts per the 10/8 los.  Treatment was added for 10/22.

## 2019-05-17 NOTE — Patient Instructions (Signed)

## 2019-05-19 ENCOUNTER — Other Ambulatory Visit: Payer: Self-pay

## 2019-05-19 ENCOUNTER — Inpatient Hospital Stay: Payer: BC Managed Care – PPO

## 2019-05-19 VITALS — BP 120/80 | HR 75 | Temp 98.5°F | Resp 18

## 2019-05-19 DIAGNOSIS — C20 Malignant neoplasm of rectum: Secondary | ICD-10-CM

## 2019-05-19 DIAGNOSIS — Z79899 Other long term (current) drug therapy: Secondary | ICD-10-CM | POA: Diagnosis not present

## 2019-05-19 DIAGNOSIS — Z5111 Encounter for antineoplastic chemotherapy: Secondary | ICD-10-CM | POA: Diagnosis not present

## 2019-05-19 DIAGNOSIS — Z7689 Persons encountering health services in other specified circumstances: Secondary | ICD-10-CM | POA: Diagnosis not present

## 2019-05-19 DIAGNOSIS — R634 Abnormal weight loss: Secondary | ICD-10-CM | POA: Diagnosis not present

## 2019-05-19 DIAGNOSIS — Z23 Encounter for immunization: Secondary | ICD-10-CM | POA: Diagnosis not present

## 2019-05-19 MED ORDER — HEPARIN SOD (PORK) LOCK FLUSH 100 UNIT/ML IV SOLN
500.0000 [IU] | Freq: Once | INTRAVENOUS | Status: AC | PRN
  Administered 2019-05-19: 500 [IU]
  Filled 2019-05-19: qty 5

## 2019-05-19 MED ORDER — PEGFILGRASTIM-CBQV 6 MG/0.6ML ~~LOC~~ SOSY
6.0000 mg | PREFILLED_SYRINGE | Freq: Once | SUBCUTANEOUS | Status: AC
  Administered 2019-05-19: 6 mg via SUBCUTANEOUS

## 2019-05-19 MED ORDER — SODIUM CHLORIDE 0.9% FLUSH
10.0000 mL | INTRAVENOUS | Status: DC | PRN
  Administered 2019-05-19: 10 mL
  Filled 2019-05-19: qty 10

## 2019-05-24 ENCOUNTER — Other Ambulatory Visit: Payer: BC Managed Care – PPO

## 2019-05-24 ENCOUNTER — Ambulatory Visit: Payer: BC Managed Care – PPO | Admitting: Oncology

## 2019-05-24 ENCOUNTER — Ambulatory Visit: Payer: BC Managed Care – PPO

## 2019-05-27 ENCOUNTER — Other Ambulatory Visit: Payer: Self-pay | Admitting: Oncology

## 2019-05-30 ENCOUNTER — Other Ambulatory Visit: Payer: Self-pay | Admitting: *Deleted

## 2019-05-30 DIAGNOSIS — C20 Malignant neoplasm of rectum: Secondary | ICD-10-CM

## 2019-05-31 ENCOUNTER — Inpatient Hospital Stay (HOSPITAL_BASED_OUTPATIENT_CLINIC_OR_DEPARTMENT_OTHER): Payer: BC Managed Care – PPO | Admitting: Nurse Practitioner

## 2019-05-31 ENCOUNTER — Encounter: Payer: Self-pay | Admitting: Nurse Practitioner

## 2019-05-31 ENCOUNTER — Other Ambulatory Visit: Payer: Self-pay

## 2019-05-31 ENCOUNTER — Inpatient Hospital Stay: Payer: BC Managed Care – PPO

## 2019-05-31 VITALS — BP 138/84 | HR 77 | Temp 98.2°F | Resp 18 | Ht 69.0 in | Wt 181.6 lb

## 2019-05-31 DIAGNOSIS — C20 Malignant neoplasm of rectum: Secondary | ICD-10-CM

## 2019-05-31 DIAGNOSIS — Z79899 Other long term (current) drug therapy: Secondary | ICD-10-CM | POA: Diagnosis not present

## 2019-05-31 DIAGNOSIS — Z23 Encounter for immunization: Secondary | ICD-10-CM | POA: Diagnosis not present

## 2019-05-31 DIAGNOSIS — R634 Abnormal weight loss: Secondary | ICD-10-CM | POA: Diagnosis not present

## 2019-05-31 DIAGNOSIS — Z5111 Encounter for antineoplastic chemotherapy: Secondary | ICD-10-CM | POA: Diagnosis not present

## 2019-05-31 DIAGNOSIS — Z7689 Persons encountering health services in other specified circumstances: Secondary | ICD-10-CM | POA: Diagnosis not present

## 2019-05-31 LAB — CMP (CANCER CENTER ONLY)
ALT: 155 U/L — ABNORMAL HIGH (ref 0–44)
AST: 92 U/L — ABNORMAL HIGH (ref 15–41)
Albumin: 4 g/dL (ref 3.5–5.0)
Alkaline Phosphatase: 211 U/L — ABNORMAL HIGH (ref 38–126)
Anion gap: 11 (ref 5–15)
BUN: 10 mg/dL (ref 6–20)
CO2: 26 mmol/L (ref 22–32)
Calcium: 10 mg/dL (ref 8.9–10.3)
Chloride: 106 mmol/L (ref 98–111)
Creatinine: 0.87 mg/dL (ref 0.61–1.24)
GFR, Est AFR Am: >60 mL/min (ref >60)
GFR, Estimated: >60 mL/min (ref >60)
Glucose, Bld: 105 mg/dL — ABNORMAL HIGH (ref 70–99)
Potassium: 4.6 mmol/L (ref 3.5–5.1)
Sodium: 143 mmol/L (ref 135–145)
Total Bilirubin: 0.3 mg/dL (ref 0.3–1.2)
Total Protein: 7.5 g/dL (ref 6.5–8.1)

## 2019-05-31 LAB — CBC WITH DIFFERENTIAL (CANCER CENTER ONLY)
Abs Immature Granulocytes: 0.81 10*3/uL — ABNORMAL HIGH (ref 0.00–0.07)
Basophils Absolute: 0.1 10*3/uL (ref 0.0–0.1)
Basophils Relative: 1 %
Eosinophils Absolute: 0 10*3/uL (ref 0.0–0.5)
Eosinophils Relative: 0 %
HCT: 41.9 % (ref 39.0–52.0)
Hemoglobin: 14.1 g/dL (ref 13.0–17.0)
Immature Granulocytes: 7 %
Lymphocytes Relative: 13 %
Lymphs Abs: 1.6 10*3/uL (ref 0.7–4.0)
MCH: 31.5 pg (ref 26.0–34.0)
MCHC: 33.7 g/dL (ref 30.0–36.0)
MCV: 93.5 fL (ref 80.0–100.0)
Monocytes Absolute: 1.2 10*3/uL — ABNORMAL HIGH (ref 0.1–1.0)
Monocytes Relative: 10 %
Neutro Abs: 8.4 10*3/uL — ABNORMAL HIGH (ref 1.7–7.7)
Neutrophils Relative %: 69 %
Platelet Count: 148 10*3/uL — ABNORMAL LOW (ref 150–400)
RBC: 4.48 MIL/uL (ref 4.22–5.81)
RDW: 17.2 % — ABNORMAL HIGH (ref 11.5–15.5)
WBC Count: 12.2 10*3/uL — ABNORMAL HIGH (ref 4.0–10.5)
nRBC: 0 % (ref 0.0–0.2)

## 2019-05-31 MED ORDER — SODIUM CHLORIDE 0.9 % IV SOLN
2400.0000 mg/m2 | INTRAVENOUS | Status: DC
  Administered 2019-05-31: 4700 mg via INTRAVENOUS
  Filled 2019-05-31: qty 94

## 2019-05-31 MED ORDER — PALONOSETRON HCL INJECTION 0.25 MG/5ML
INTRAVENOUS | Status: AC
  Filled 2019-05-31: qty 5

## 2019-05-31 MED ORDER — DEXTROSE 5 % IV SOLN
Freq: Once | INTRAVENOUS | Status: AC
  Administered 2019-05-31: 14:00:00 via INTRAVENOUS
  Filled 2019-05-31: qty 250

## 2019-05-31 MED ORDER — FLUOROURACIL CHEMO INJECTION 2.5 GM/50ML
400.0000 mg/m2 | Freq: Once | INTRAVENOUS | Status: AC
  Administered 2019-05-31: 800 mg via INTRAVENOUS
  Filled 2019-05-31: qty 16

## 2019-05-31 MED ORDER — DEXAMETHASONE SODIUM PHOSPHATE 10 MG/ML IJ SOLN
INTRAMUSCULAR | Status: AC
  Filled 2019-05-31: qty 1

## 2019-05-31 MED ORDER — OXALIPLATIN CHEMO INJECTION 100 MG/20ML
85.0000 mg/m2 | Freq: Once | INTRAVENOUS | Status: AC
  Administered 2019-05-31: 165 mg via INTRAVENOUS
  Filled 2019-05-31: qty 33

## 2019-05-31 MED ORDER — LEUCOVORIN CALCIUM INJECTION 350 MG
400.0000 mg/m2 | Freq: Once | INTRAVENOUS | Status: AC
  Administered 2019-05-31: 780 mg via INTRAVENOUS
  Filled 2019-05-31: qty 39

## 2019-05-31 MED ORDER — DEXTROSE 5 % IV SOLN
Freq: Once | INTRAVENOUS | Status: AC
  Administered 2019-05-31: 13:00:00 via INTRAVENOUS
  Filled 2019-05-31: qty 250

## 2019-05-31 MED ORDER — PALONOSETRON HCL INJECTION 0.25 MG/5ML
0.2500 mg | Freq: Once | INTRAVENOUS | Status: AC
  Administered 2019-05-31: 0.25 mg via INTRAVENOUS

## 2019-05-31 MED ORDER — DEXAMETHASONE SODIUM PHOSPHATE 10 MG/ML IJ SOLN
10.0000 mg | Freq: Once | INTRAMUSCULAR | Status: AC
  Administered 2019-05-31: 10 mg via INTRAVENOUS

## 2019-05-31 NOTE — Progress Notes (Signed)
Ok to tx at current chemo doses and elevated LFTs per discussion with [name], NP

## 2019-05-31 NOTE — Patient Instructions (Signed)
[facility] Discharge Instructions for Patients Receiving Chemotherapy  Today you received the following chemotherapy agents Oxaliplatin (ELOXATIN), Leucovorin & Flourouracil (ADRUCIL).  To help prevent nausea and vomiting after your treatment, we encourage you to take your nausea medication as prescribed.   If you develop nausea and vomiting that is not controlled by your nausea medication, call the clinic.   BELOW ARE SYMPTOMS THAT SHOULD BE REPORTED IMMEDIATELY:  *FEVER GREATER THAN 100.5 F  *CHILLS WITH OR WITHOUT FEVER  NAUSEA AND VOMITING THAT IS NOT CONTROLLED WITH YOUR NAUSEA MEDICATION  *UNUSUAL SHORTNESS OF BREATH  *UNUSUAL BRUISING OR BLEEDING  TENDERNESS IN MOUTH AND THROAT WITH OR WITHOUT PRESENCE OF ULCERS  *URINARY PROBLEMS  *BOWEL PROBLEMS  UNUSUAL RASH Items with * indicate a potential emergency and should be followed up as soon as possible.  Feel free to call the clinic should you have any questions or concerns. The clinic phone number is [phone].  Please show the CHEMO ALERT CARD at check-in to the Emergency Department and triage nurse.  Coronavirus (COVID-19) Are you at risk?  Are you at risk for the Coronavirus (COVID-19)?  To be considered HIGH RISK for Coronavirus (COVID-19), you have to meet the following criteria:  . Traveled to [place], [place], [place], [place] or [place]; or in the [place] to [place], [place], [place], or [place]; and have fever, cough, and shortness of breath within the last 2 weeks of travel OR . Been in close contact with a person diagnosed with COVID-19 within the last 2 weeks and have fever, cough, and shortness of breath . IF YOU DO NOT MEET THESE CRITERIA, YOU ARE CONSIDERED LOW RISK FOR COVID-19.  What to do if you are HIGH RISK for COVID-19?  . If you are having a medical emergency, call 911. . Seek medical care right away. Before you go to a doctor's office, urgent care or emergency  department, call ahead and tell them about your recent travel, contact with someone diagnosed with COVID-19, and your symptoms. You should receive instructions from your physician's office regarding next steps of care.  . When you arrive at healthcare provider, tell the healthcare staff immediately you have returned from visiting [place], [place], [place], [place] or [place]; or traveled in the [place] to [place], [place], [place], or [place]; in the last two weeks or you have been in close contact with a person diagnosed with COVID-19 in the last 2 weeks.   . Tell the health care staff about your symptoms: fever, cough and shortness of breath. . After you have been seen by a medical provider, you will be either: o Tested for (COVID-19) and discharged home on quarantine except to seek medical care if symptoms worsen, and asked to  - Stay home and avoid contact with others until you get your results (4-5 days)  - Avoid travel on public transportation if possible (such as bus, train, or airplane) or o Sent to the Emergency Department by EMS for evaluation, COVID-19 testing, and possible admission depending on your condition and test results.  What to do if you are LOW RISK for COVID-19?  Reduce your risk of any infection by using the same precautions used for avoiding the common cold or flu:  . Wash your hands often with soap and warm water for at least 20 seconds.  If soap and water are not readily available, use an alcohol-based hand sanitizer with at least 60% alcohol.  .   If coughing or sneezing, cover your mouth and nose by coughing or sneezing into the elbow areas of your shirt or coat, into a tissue or into your sleeve (not your hands). . Avoid shaking hands with others and consider head nods or verbal greetings only. . Avoid touching your eyes, nose, or mouth with unwashed hands.  . Avoid close contact with people who are sick. . Avoid places or events with large numbers of people  in one location, like concerts or sporting events. . Carefully consider travel plans you have or are making. . If you are planning any travel outside or inside the [place], visit the CDC's Travelers' Health webpage for the latest health notices. . If you have some symptoms but not all symptoms, continue to monitor at home and seek medical attention if your symptoms worsen. . If you are having a medical emergency, call 911.   ADDITIONAL HEALTHCARE OPTIONS FOR PATIENTS  [facility] / e-Visit: [email or web address]         [facility]: [phone]  [facility]: [phone]                   [facility]: [phone]   

## 2019-05-31 NOTE — Progress Notes (Signed)
[facility] OFFICE PROGRESS NOTE   Diagnosis: Rectal cancer  INTERVAL HISTORY:   Mr. Goddard returns as scheduled.  He completed cycle 5 FOLFOX 05/17/2019.  He received Udenyca on the day of pump discontinuation.  He had mild nausea.  No vomiting.  No mouth sores.  He did note his tongue was white.  No diarrhea.  Cold sensitivity lasted 3 days.  No persistent neuropathy symptoms.  Objective:  Vital signs in last 24 hours:  Blood pressure 138/84, pulse 77, temperature 98.2 F (36.8 C), temperature source Temporal, resp. rate 18, height _0  (1.753 m), weight 181 lb 9.6 oz (82.4 kg), SpO2 100 %.    HEENT: Mild white coating over tongue.  No buccal thrush.  No ulcers. GI: Abdomen soft and nontender.  No hepatomegaly. Vascular: No leg edema. Neuro: Vibratory sense intact over the fingertips per tuning fork exam. Skin: Palms without erythema. Port-A-Cath without erythema.   Lab Results:  Lab Results  Component Value Date   WBC 12.2 (H) 05/31/2019   HGB 14.1 05/31/2019   HCT 41.9 05/31/2019   MCV 93.5 05/31/2019   PLT 148 (L) 05/31/2019   NEUTROABS 8.4 (H) 05/31/2019    Imaging:  No results found.  Medications: I have reviewed the patient's current medications.  Assessment/Plan: 1. Rectal cancer  Colonoscopy 02/15/2019-frond-like/villous, fungating, infiltrative and ulcerated partially obstructing large mass extending from the dentate line into the distal and mid rectum. Mass partially circumferential. Mass measured 9 cm in length. Biopsies showed invasive adenocarcinoma; preserved expression of the major and minor MMR proteins.   Upper endoscopy 02/15/2019-distal esophagitis, esophageal mucosal changes suspicious for long segment Barrett's esophagus. Biopsy of the duodenum showed peptic duodenitis with no dysplasia or malignancy; random gastric biopsy showed mild chronic gastritis and no intestinal metaplasia dysplasia or malignancy. Distal esophagus  biopsy showed intestinal metaplasia consistent with Barrett's esophagus and no dysplasia or malignancy.  CTs 02/22/2019-wall thickening of the rectum. A few subcentimeter perirectal lymph nodes. Small bowel to small bowel intussusception within the jejunum which appears to partially resolve on delayed imaging. 2 mm nonspecific pulmonary nodule superior segment left lower lobe.  03/06/2019 lower endoscopic ultrasound-flex impression-malignant partially obstructing tumor in the proximal rectum and in the mid rectum, internal hemorrhoids. EUS impression-rectal mass visualized endosonography, staged T3N1. 1 malignant appearing lymph node was visualized in a peritumoral location. Endosonographic appearance suspicious for metastatic colorectal adenocarcinoma. Internal anal sphincter was visualized and appeared normal. No sign of significant pathology at the rectosigmoid junction and in the sigmoid colon. Prostate noted with calcification within it.  Cycle 1 FOLFOX 03/15/2019  Cycle 2 FOLFOX 03/28/2019  Cycle 3 FOLFOX 04/12/2019  Cycle 4 FOLFOX 04/25/2019  Cycle 5 FOLFOX 05/17/2019, Udenyca added  Cycle 6 FOLFOX 05/31/2019, Udenyca 2. Change in bowel habits secondary to#08 Dec 2018 3. Rectal bleeding secondary to#08 Dec 2018 4. Weight loss 5. Port-A-Cath placement, Dr. Marcello Moores, 03/08/2019   Disposition: Mr. Markson appears stable.  He has completed 5 cycles of FOLFOX.  Plan to proceed with cycle 6 today as scheduled.  We reviewed the CBC from today.  Counts adequate to proceed with treatment.  He will again receive Udenyca on the day of pump discontinuation.  We reviewed the chemistry panel results.  LFTs remain elevated, likely related to oxaliplatin.  I discussed with the [place] pharmacist and Dr. Benay Spice.  Plan to continue to monitor.  He will return for lab, follow-up, cycle 7 FOLFOX in 2 weeks.  He will contact the office  in the interim with any problems.    [name]  ANP/GNP-BC   05/31/2019  1:15 PM

## 2019-06-01 NOTE — Telephone Encounter (Signed)
Jeffrey Day if following up

## 2019-06-02 ENCOUNTER — Inpatient Hospital Stay: Payer: BC Managed Care – PPO

## 2019-06-02 ENCOUNTER — Other Ambulatory Visit: Payer: Self-pay

## 2019-06-02 VITALS — BP 117/76 | HR 83 | Temp 99.1°F | Resp 16

## 2019-06-02 DIAGNOSIS — R634 Abnormal weight loss: Secondary | ICD-10-CM | POA: Diagnosis not present

## 2019-06-02 DIAGNOSIS — Z23 Encounter for immunization: Secondary | ICD-10-CM | POA: Diagnosis not present

## 2019-06-02 DIAGNOSIS — Z7689 Persons encountering health services in other specified circumstances: Secondary | ICD-10-CM | POA: Diagnosis not present

## 2019-06-02 DIAGNOSIS — Z79899 Other long term (current) drug therapy: Secondary | ICD-10-CM | POA: Diagnosis not present

## 2019-06-02 DIAGNOSIS — C20 Malignant neoplasm of rectum: Secondary | ICD-10-CM | POA: Diagnosis not present

## 2019-06-02 DIAGNOSIS — Z5111 Encounter for antineoplastic chemotherapy: Secondary | ICD-10-CM | POA: Diagnosis not present

## 2019-06-02 MED ORDER — PEGFILGRASTIM-CBQV 6 MG/0.6ML ~~LOC~~ SOSY
PREFILLED_SYRINGE | SUBCUTANEOUS | Status: AC
  Filled 2019-06-02: qty 0.6

## 2019-06-02 MED ORDER — PEGFILGRASTIM-CBQV 6 MG/0.6ML ~~LOC~~ SOSY
6.0000 mg | PREFILLED_SYRINGE | Freq: Once | SUBCUTANEOUS | Status: AC
  Administered 2019-06-02: 6 mg via SUBCUTANEOUS

## 2019-06-02 MED ORDER — SODIUM CHLORIDE 0.9% FLUSH
10.0000 mL | INTRAVENOUS | Status: DC | PRN
  Administered 2019-06-02: 10 mL
  Filled 2019-06-02: qty 10

## 2019-06-02 MED ORDER — HEPARIN SOD (PORK) LOCK FLUSH 100 UNIT/ML IV SOLN
500.0000 [IU] | Freq: Once | INTRAVENOUS | Status: AC | PRN
  Administered 2019-06-02: 500 [IU]
  Filled 2019-06-02: qty 5

## 2019-06-10 ENCOUNTER — Other Ambulatory Visit: Payer: Self-pay | Admitting: Oncology

## 2019-06-14 ENCOUNTER — Inpatient Hospital Stay: Payer: BC Managed Care – PPO | Attending: Nurse Practitioner | Admitting: Nurse Practitioner

## 2019-06-14 ENCOUNTER — Inpatient Hospital Stay: Payer: BC Managed Care – PPO

## 2019-06-14 ENCOUNTER — Other Ambulatory Visit: Payer: Self-pay

## 2019-06-14 ENCOUNTER — Encounter: Payer: Self-pay | Admitting: Nurse Practitioner

## 2019-06-14 VITALS — BP 128/84

## 2019-06-14 VITALS — BP 129/99 | HR 75 | Temp 98.5°F | Resp 17 | Ht 69.0 in | Wt 181.3 lb

## 2019-06-14 DIAGNOSIS — C20 Malignant neoplasm of rectum: Secondary | ICD-10-CM | POA: Diagnosis not present

## 2019-06-14 DIAGNOSIS — R634 Abnormal weight loss: Secondary | ICD-10-CM | POA: Diagnosis not present

## 2019-06-14 DIAGNOSIS — Z5111 Encounter for antineoplastic chemotherapy: Secondary | ICD-10-CM | POA: Diagnosis not present

## 2019-06-14 DIAGNOSIS — Z95828 Presence of other vascular implants and grafts: Secondary | ICD-10-CM

## 2019-06-14 LAB — CBC WITH DIFFERENTIAL (CANCER CENTER ONLY)
Abs Immature Granulocytes: 2.62 10*3/uL — ABNORMAL HIGH (ref 0.00–0.07)
Basophils Absolute: 0.1 10*3/uL (ref 0.0–0.1)
Basophils Relative: 0 %
Eosinophils Absolute: 0 10*3/uL (ref 0.0–0.5)
Eosinophils Relative: 0 %
HCT: 40.5 % (ref 39.0–52.0)
Hemoglobin: 13.5 g/dL (ref 13.0–17.0)
Immature Granulocytes: 13 %
Lymphocytes Relative: 10 %
Lymphs Abs: 2 10*3/uL (ref 0.7–4.0)
MCH: 31.8 pg (ref 26.0–34.0)
MCHC: 33.3 g/dL (ref 30.0–36.0)
MCV: 95.3 fL (ref 80.0–100.0)
Monocytes Absolute: 1.5 10*3/uL — ABNORMAL HIGH (ref 0.1–1.0)
Monocytes Relative: 7 %
Neutro Abs: 14.3 10*3/uL — ABNORMAL HIGH (ref 1.7–7.7)
Neutrophils Relative %: 70 %
Platelet Count: 145 10*3/uL — ABNORMAL LOW (ref 150–400)
RBC: 4.25 MIL/uL (ref 4.22–5.81)
RDW: 18.2 % — ABNORMAL HIGH (ref 11.5–15.5)
WBC Count: 20.4 10*3/uL — ABNORMAL HIGH (ref 4.0–10.5)
nRBC: 0.1 % (ref 0.0–0.2)

## 2019-06-14 LAB — CMP (CANCER CENTER ONLY)
ALT: 128 U/L — ABNORMAL HIGH (ref 0–44)
AST: 78 U/L — ABNORMAL HIGH (ref 15–41)
Albumin: 4 g/dL (ref 3.5–5.0)
Alkaline Phosphatase: 282 U/L — ABNORMAL HIGH (ref 38–126)
Anion gap: 10 (ref 5–15)
BUN: 7 mg/dL (ref 6–20)
CO2: 26 mmol/L (ref 22–32)
Calcium: 10 mg/dL (ref 8.9–10.3)
Chloride: 108 mmol/L (ref 98–111)
Creatinine: 0.86 mg/dL (ref 0.61–1.24)
GFR, Est AFR Am: >60 mL/min (ref >60)
GFR, Estimated: >60 mL/min (ref >60)
Glucose, Bld: 116 mg/dL — ABNORMAL HIGH (ref 70–99)
Potassium: 4.2 mmol/L (ref 3.5–5.1)
Sodium: 144 mmol/L (ref 135–145)
Total Bilirubin: 0.4 mg/dL (ref 0.3–1.2)
Total Protein: 7.4 g/dL (ref 6.5–8.1)

## 2019-06-14 MED ORDER — PALONOSETRON HCL INJECTION 0.25 MG/5ML
0.2500 mg | Freq: Once | INTRAVENOUS | Status: AC
  Administered 2019-06-14: 0.25 mg via INTRAVENOUS

## 2019-06-14 MED ORDER — FLUOROURACIL CHEMO INJECTION 2.5 GM/50ML
400.0000 mg/m2 | Freq: Once | INTRAVENOUS | Status: AC
  Administered 2019-06-14: 800 mg via INTRAVENOUS
  Filled 2019-06-14: qty 16

## 2019-06-14 MED ORDER — SODIUM CHLORIDE 0.9% FLUSH
10.0000 mL | INTRAVENOUS | Status: DC | PRN
  Filled 2019-06-14: qty 10

## 2019-06-14 MED ORDER — LEUCOVORIN CALCIUM INJECTION 350 MG
400.0000 mg/m2 | Freq: Once | INTRAVENOUS | Status: AC
  Administered 2019-06-14: 780 mg via INTRAVENOUS
  Filled 2019-06-14: qty 39

## 2019-06-14 MED ORDER — PALONOSETRON HCL INJECTION 0.25 MG/5ML
INTRAVENOUS | Status: AC
  Filled 2019-06-14: qty 5

## 2019-06-14 MED ORDER — SODIUM CHLORIDE 0.9% FLUSH
10.0000 mL | INTRAVENOUS | Status: DC | PRN
  Administered 2019-06-14: 10:00:00 10 mL
  Filled 2019-06-14: qty 10

## 2019-06-14 MED ORDER — DEXTROSE 5 % IV SOLN
Freq: Once | INTRAVENOUS | Status: AC
  Administered 2019-06-14: 13:00:00 via INTRAVENOUS
  Filled 2019-06-14: qty 250

## 2019-06-14 MED ORDER — DEXAMETHASONE SODIUM PHOSPHATE 10 MG/ML IJ SOLN
10.0000 mg | Freq: Once | INTRAMUSCULAR | Status: AC
  Administered 2019-06-14: 10 mg via INTRAVENOUS

## 2019-06-14 MED ORDER — OXALIPLATIN CHEMO INJECTION 100 MG/20ML
85.0000 mg/m2 | Freq: Once | INTRAVENOUS | Status: AC
  Administered 2019-06-14: 14:00:00 165 mg via INTRAVENOUS
  Filled 2019-06-14: qty 33

## 2019-06-14 MED ORDER — HEPARIN SOD (PORK) LOCK FLUSH 100 UNIT/ML IV SOLN
500.0000 [IU] | Freq: Once | INTRAVENOUS | Status: DC | PRN
  Filled 2019-06-14: qty 5

## 2019-06-14 MED ORDER — SODIUM CHLORIDE 0.9 % IV SOLN
2400.0000 mg/m2 | INTRAVENOUS | Status: DC
  Administered 2019-06-14: 4700 mg via INTRAVENOUS
  Filled 2019-06-14: qty 94

## 2019-06-14 MED ORDER — DEXAMETHASONE SODIUM PHOSPHATE 10 MG/ML IJ SOLN
INTRAMUSCULAR | Status: AC
  Filled 2019-06-14: qty 1

## 2019-06-14 NOTE — Progress Notes (Signed)
Blood return noted before, during, and after Fluorouracil IV push. 

## 2019-06-14 NOTE — Progress Notes (Signed)
[place] OFFICE PROGRESS NOTE   Diagnosis: Rectal cancer  INTERVAL HISTORY:   Mr. Colborn returns as scheduled.  He completed cycle 6 FOLFOX 05/31/2019.  He denies significant nausea.  No mouth sores.  No diarrhea.  He has jaw pain for a few days.  Cold sensitivity lasted more than 10 days.  No persistent neuropathy symptoms.  He is no longer having rectal pain and the bleeding is significantly better.  Objective:  Vital signs in last 24 hours:  Blood pressure (!) 129/99, pulse 75, temperature 98.5 F (36.9 C), temperature source Temporal, resp. rate 17, height 5' 9" (1.753 m), weight 181 lb 4.8 oz (82.2 kg), SpO2 100 %.    HEENT: No thrush or ulcers. GI: Abdomen soft and nontender.  No hepatomegaly. Vascular: No leg edema. Neuro: Vibratory sense mildly decreased over the fingertips per tuning fork exam. Skin: Palms without erythema. Port-A-Cath without erythema.   Lab Results:  Lab Results  Component Value Date   WBC 20.4 (H) 06/14/2019   HGB 13.5 06/14/2019   HCT 40.5 06/14/2019   MCV 95.3 06/14/2019   PLT 145 (L) 06/14/2019   NEUTROABS PENDING 06/14/2019    Imaging:  No results found.  Medications: I have reviewed the patient's current medications.  Assessment/Plan: 1. Rectal cancer  Colonoscopy 02/15/2019-frond-like/villous, fungating, infiltrative and ulcerated partially obstructing large mass extending from the dentate line into the distal and mid rectum. Mass partially circumferential. Mass measured 9 cm in length. Biopsies showed invasive adenocarcinoma; preserved expression of the major and minor MMR proteins.   Upper endoscopy 02/15/2019-distal esophagitis, esophageal mucosal changes suspicious for long segment Barrett's esophagus. Biopsy of the duodenum showed peptic duodenitis with no dysplasia or malignancy; random gastric biopsy showed mild chronic gastritis and no intestinal metaplasia dysplasia or malignancy. Distal esophagus  biopsy showed intestinal metaplasia consistent with Barrett's esophagus and no dysplasia or malignancy.  CTs 02/22/2019-wall thickening of the rectum. A few subcentimeter perirectal lymph nodes. Small bowel to small bowel intussusception within the jejunum which appears to partially resolve on delayed imaging. 2 mm nonspecific pulmonary nodule superior segment left lower lobe.  03/06/2019 lower endoscopic ultrasound-flex impression-malignant partially obstructing tumor in the proximal rectum and in the mid rectum, internal hemorrhoids. EUS impression-rectal mass visualized endosonography, staged T3N1. 1 malignant appearing lymph node was visualized in a peritumoral location. Endosonographic appearance suspicious for metastatic colorectal adenocarcinoma. Internal anal sphincter was visualized and appeared normal. No sign of significant pathology at the rectosigmoid junction and in the sigmoid colon. Prostate noted with calcification within it.  Cycle 1 FOLFOX 03/15/2019  Cycle 2 FOLFOX 03/28/2019  Cycle 3 FOLFOX 04/12/2019  Cycle 4 FOLFOX 04/25/2019  Cycle 5 FOLFOX 05/17/2019, Udenyca added  Cycle 6 FOLFOX 05/31/2019, Udenyca  Cycle 7 FOLFOX 06/14/2019, Udenyca held 2. Change in bowel habits secondary to#08 Dec 2018 3. Rectal bleeding secondary to#08 Dec 2018 4. Weight loss 5. Port-A-Cath placement, Dr. Marcello Moores, 03/08/2019    Disposition: Mr. Demello appears stable.  He has completed 6 cycles of FOLFOX.  Plan to proceed with cycle 7 today as scheduled.  We will hold Udenyca with this cycle due to the elevated absolute neutrophil count.  LFTs with stable elevation, likely related to oxaliplatin.  Continue to monitor.  He will return for lab, follow-up, cycle 8 FOLFOX in 2 weeks.  He will contact the office in the interim with any problems.  Plan reviewed with Dr. Benay Spice.    [name] ANP/GNP-BC   06/14/2019  10:40 AM

## 2019-06-14 NOTE — Patient Instructions (Signed)
[facility] Discharge Instructions for Patients Receiving Chemotherapy  Today you received the following chemotherapy agents Oxaliplatin, Lecovorin, Fluorouracil  To help prevent nausea and vomiting after your treatment, we encourage you to take your nausea medication as directed by your MD   If you develop nausea and vomiting that is not controlled by your nausea medication, call the clinic.   BELOW ARE SYMPTOMS THAT SHOULD BE REPORTED IMMEDIATELY:  *FEVER GREATER THAN 100.5 F  *CHILLS WITH OR WITHOUT FEVER  NAUSEA AND VOMITING THAT IS NOT CONTROLLED WITH YOUR NAUSEA MEDICATION  *UNUSUAL SHORTNESS OF BREATH  *UNUSUAL BRUISING OR BLEEDING  TENDERNESS IN MOUTH AND THROAT WITH OR WITHOUT PRESENCE OF ULCERS  *URINARY PROBLEMS  *BOWEL PROBLEMS  UNUSUAL RASH Items with * indicate a potential emergency and should be followed up as soon as possible.  Feel free to call the clinic should you have any questions or concerns. The clinic phone number is [phone].  Please show the CHEMO ALERT CARD at check-in to the Emergency Department and triage nurse.  Coronavirus (COVID-19) Are you at risk?  Are you at risk for the Coronavirus (COVID-19)?  To be considered HIGH RISK for Coronavirus (COVID-19), you have to meet the following criteria:  . Traveled to [place], [place], [place], [place] or [place]; or in the [place] to [place], [place], [place], or [place]; and have fever, cough, and shortness of breath within the last 2 weeks of travel OR . Been in close contact with a person diagnosed with COVID-19 within the last 2 weeks and have fever, cough, and shortness of breath . IF YOU DO NOT MEET THESE CRITERIA, YOU ARE CONSIDERED LOW RISK FOR COVID-19.  What to do if you are HIGH RISK for COVID-19?  . If you are having a medical emergency, call 911. . Seek medical care right away. Before you go to a doctor's office, urgent care or emergency department, call  ahead and tell them about your recent travel, contact with someone diagnosed with COVID-19, and your symptoms. You should receive instructions from your physician's office regarding next steps of care.  . When you arrive at healthcare provider, tell the healthcare staff immediately you have returned from visiting [place], [place], [place], [place] or [place]; or traveled in the [place] to [place], [place], [place], or [place]; in the last two weeks or you have been in close contact with a person diagnosed with COVID-19 in the last 2 weeks.   . Tell the health care staff about your symptoms: fever, cough and shortness of breath. . After you have been seen by a medical provider, you will be either: o Tested for (COVID-19) and discharged home on quarantine except to seek medical care if symptoms worsen, and asked to  - Stay home and avoid contact with others until you get your results (4-5 days)  - Avoid travel on public transportation if possible (such as bus, train, or airplane) or o Sent to the Emergency Department by EMS for evaluation, COVID-19 testing, and possible admission depending on your condition and test results.  What to do if you are LOW RISK for COVID-19?  Reduce your risk of any infection by using the same precautions used for avoiding the common cold or flu:  . Wash your hands often with soap and warm water for at least 20 seconds.  If soap and water are not readily available, use an alcohol-based hand sanitizer with at least 60% alcohol.  .   If coughing or sneezing, cover your mouth and nose by coughing or sneezing into the elbow areas of your shirt or coat, into a tissue or into your sleeve (not your hands). . Avoid shaking hands with others and consider head nods or verbal greetings only. . Avoid touching your eyes, nose, or mouth with unwashed hands.  . Avoid close contact with people who are sick. . Avoid places or events with large numbers of people in one location,  like concerts or sporting events. . Carefully consider travel plans you have or are making. . If you are planning any travel outside or inside the [place], visit the CDC's Travelers' Health webpage for the latest health notices. . If you have some symptoms but not all symptoms, continue to monitor at home and seek medical attention if your symptoms worsen. . If you are having a medical emergency, call 911.   ADDITIONAL HEALTHCARE OPTIONS FOR PATIENTS  [facility] / e-Visit: [email or web address]         [facility]: [phone]  [facility]: [phone]                   [facility]: [phone]    

## 2019-06-15 DIAGNOSIS — C20 Malignant neoplasm of rectum: Secondary | ICD-10-CM | POA: Diagnosis not present

## 2019-06-16 ENCOUNTER — Inpatient Hospital Stay: Payer: BC Managed Care – PPO

## 2019-06-16 VITALS — BP 146/82 | HR 87 | Temp 98.7°F | Resp 18

## 2019-06-16 DIAGNOSIS — R634 Abnormal weight loss: Secondary | ICD-10-CM | POA: Diagnosis not present

## 2019-06-16 DIAGNOSIS — Z5111 Encounter for antineoplastic chemotherapy: Secondary | ICD-10-CM | POA: Diagnosis not present

## 2019-06-16 DIAGNOSIS — C20 Malignant neoplasm of rectum: Secondary | ICD-10-CM | POA: Diagnosis not present

## 2019-06-16 MED ORDER — HEPARIN SOD (PORK) LOCK FLUSH 100 UNIT/ML IV SOLN
500.0000 [IU] | Freq: Once | INTRAVENOUS | Status: AC | PRN
  Administered 2019-06-16: 500 [IU]
  Filled 2019-06-16: qty 5

## 2019-06-16 MED ORDER — SODIUM CHLORIDE 0.9% FLUSH
10.0000 mL | INTRAVENOUS | Status: DC | PRN
  Administered 2019-06-16: 10 mL
  Filled 2019-06-16: qty 10

## 2019-06-18 ENCOUNTER — Telehealth: Payer: Self-pay | Admitting: Radiation Oncology

## 2019-06-18 NOTE — Telephone Encounter (Signed)
New message: ° ° °LVM for patient to return call to schedule appt from referral received. °

## 2019-06-24 ENCOUNTER — Other Ambulatory Visit: Payer: Self-pay | Admitting: Oncology

## 2019-06-28 ENCOUNTER — Inpatient Hospital Stay: Payer: BC Managed Care – PPO

## 2019-06-28 ENCOUNTER — Inpatient Hospital Stay: Payer: BC Managed Care – PPO | Admitting: Oncology

## 2019-06-28 ENCOUNTER — Other Ambulatory Visit: Payer: Self-pay

## 2019-06-28 VITALS — BP 150/87 | HR 77 | Temp 98.5°F | Resp 18 | Ht 69.0 in | Wt 183.3 lb

## 2019-06-28 DIAGNOSIS — C20 Malignant neoplasm of rectum: Secondary | ICD-10-CM

## 2019-06-28 DIAGNOSIS — R634 Abnormal weight loss: Secondary | ICD-10-CM | POA: Diagnosis not present

## 2019-06-28 DIAGNOSIS — Z95828 Presence of other vascular implants and grafts: Secondary | ICD-10-CM

## 2019-06-28 DIAGNOSIS — Z5111 Encounter for antineoplastic chemotherapy: Secondary | ICD-10-CM | POA: Diagnosis not present

## 2019-06-28 LAB — CBC WITH DIFFERENTIAL (CANCER CENTER ONLY)
Abs Immature Granulocytes: 0.01 10*3/uL (ref 0.00–0.07)
Basophils Absolute: 0 10*3/uL (ref 0.0–0.1)
Basophils Relative: 1 %
Eosinophils Absolute: 0 10*3/uL (ref 0.0–0.5)
Eosinophils Relative: 1 %
HCT: 37.7 % — ABNORMAL LOW (ref 39.0–52.0)
Hemoglobin: 12.8 g/dL — ABNORMAL LOW (ref 13.0–17.0)
Immature Granulocytes: 0 %
Lymphocytes Relative: 24 %
Lymphs Abs: 0.8 10*3/uL (ref 0.7–4.0)
MCH: 32.3 pg (ref 26.0–34.0)
MCHC: 34 g/dL (ref 30.0–36.0)
MCV: 95.2 fL (ref 80.0–100.0)
Monocytes Absolute: 0.6 10*3/uL (ref 0.1–1.0)
Monocytes Relative: 21 %
Neutro Abs: 1.7 10*3/uL (ref 1.7–7.7)
Neutrophils Relative %: 53 %
Platelet Count: 121 10*3/uL — ABNORMAL LOW (ref 150–400)
RBC: 3.96 MIL/uL — ABNORMAL LOW (ref 4.22–5.81)
RDW: 17.2 % — ABNORMAL HIGH (ref 11.5–15.5)
WBC Count: 3.1 10*3/uL — ABNORMAL LOW (ref 4.0–10.5)
nRBC: 0 % (ref 0.0–0.2)

## 2019-06-28 LAB — CMP (CANCER CENTER ONLY)
ALT: 100 U/L — ABNORMAL HIGH (ref 0–44)
AST: 81 U/L — ABNORMAL HIGH (ref 15–41)
Albumin: 4 g/dL (ref 3.5–5.0)
Alkaline Phosphatase: 216 U/L — ABNORMAL HIGH (ref 38–126)
Anion gap: 12 (ref 5–15)
BUN: 14 mg/dL (ref 6–20)
CO2: 23 mmol/L (ref 22–32)
Calcium: 9.4 mg/dL (ref 8.9–10.3)
Chloride: 106 mmol/L (ref 98–111)
Creatinine: 0.77 mg/dL (ref 0.61–1.24)
GFR, Est AFR Am: >60 mL/min (ref >60)
GFR, Estimated: >60 mL/min (ref >60)
Glucose, Bld: 122 mg/dL — ABNORMAL HIGH (ref 70–99)
Potassium: 4.5 mmol/L (ref 3.5–5.1)
Sodium: 141 mmol/L (ref 135–145)
Total Bilirubin: 0.5 mg/dL (ref 0.3–1.2)
Total Protein: 7.3 g/dL (ref 6.5–8.1)

## 2019-06-28 MED ORDER — OXALIPLATIN CHEMO INJECTION 100 MG/20ML
85.0000 mg/m2 | Freq: Once | INTRAVENOUS | Status: AC
  Administered 2019-06-28: 165 mg via INTRAVENOUS
  Filled 2019-06-28: qty 33

## 2019-06-28 MED ORDER — SODIUM CHLORIDE 0.9 % IV SOLN
2400.0000 mg/m2 | INTRAVENOUS | Status: DC
  Administered 2019-06-28: 4700 mg via INTRAVENOUS
  Filled 2019-06-28: qty 94

## 2019-06-28 MED ORDER — PALONOSETRON HCL INJECTION 0.25 MG/5ML
INTRAVENOUS | Status: AC
  Filled 2019-06-28: qty 5

## 2019-06-28 MED ORDER — FLUOROURACIL CHEMO INJECTION 2.5 GM/50ML
400.0000 mg/m2 | Freq: Once | INTRAVENOUS | Status: AC
  Administered 2019-06-28: 800 mg via INTRAVENOUS
  Filled 2019-06-28: qty 16

## 2019-06-28 MED ORDER — SODIUM CHLORIDE 0.9% FLUSH
10.0000 mL | INTRAVENOUS | Status: DC | PRN
  Filled 2019-06-28: qty 10

## 2019-06-28 MED ORDER — DEXAMETHASONE SODIUM PHOSPHATE 10 MG/ML IJ SOLN
10.0000 mg | Freq: Once | INTRAMUSCULAR | Status: AC
  Administered 2019-06-28: 10 mg via INTRAVENOUS

## 2019-06-28 MED ORDER — CAPECITABINE 500 MG PO TABS: 1500.0000 mg | ORAL_TABLET | Freq: Two times a day (BID) | ORAL | 0 refills | Status: DC

## 2019-06-28 MED ORDER — PALONOSETRON HCL INJECTION 0.25 MG/5ML
0.2500 mg | Freq: Once | INTRAVENOUS | Status: AC
  Administered 2019-06-28: 0.25 mg via INTRAVENOUS

## 2019-06-28 MED ORDER — DEXTROSE 5 % IV SOLN
Freq: Once | INTRAVENOUS | Status: AC
  Administered 2019-06-28: 10:00:00 via INTRAVENOUS
  Filled 2019-06-28: qty 250

## 2019-06-28 MED ORDER — DEXAMETHASONE SODIUM PHOSPHATE 10 MG/ML IJ SOLN
INTRAMUSCULAR | Status: AC
  Filled 2019-06-28: qty 1

## 2019-06-28 MED ORDER — LEUCOVORIN CALCIUM INJECTION 350 MG
400.0000 mg/m2 | Freq: Once | INTRAVENOUS | Status: AC
  Administered 2019-06-28: 780 mg via INTRAVENOUS
  Filled 2019-06-28: qty 39

## 2019-06-28 MED ORDER — HEPARIN SOD (PORK) LOCK FLUSH 100 UNIT/ML IV SOLN
500.0000 [IU] | Freq: Once | INTRAVENOUS | Status: DC | PRN
  Filled 2019-06-28: qty 5

## 2019-06-28 NOTE — Progress Notes (Signed)
[facility] OFFICE PROGRESS NOTE   Diagnosis: Rectal cancer  INTERVAL HISTORY:   Jeffrey Day completed another cycle of FOLFOX beginning 06/14/2019.  No nausea/vomiting, mouth sores, or diarrhea.  He has persistent cold sensitivity.  No other neuropathy symptoms.  He is having bowel movements.  Objective:  Vital signs in last 24 hours:  Blood pressure (!) 150/87, pulse 77, temperature 98.5 F (36.9 C), temperature source Temporal, resp. rate 18, height '5\' 9"'$  (1.753 m), weight 183 lb 4.8 oz (83.1 kg), SpO2 100 %.   GI: Nontender, no hepatomegaly, no mass Vascular: No leg edema Neuro: The vibratory sense is intact in the fingertips bilaterally Skin: Palms without erythema  Portacath/PICC-without erythema  Lab Results:  Lab Results  Component Value Date   WBC 3.1 (L) 06/28/2019   HGB 12.8 (L) 06/28/2019   HCT 37.7 (L) 06/28/2019   MCV 95.2 06/28/2019   PLT 121 (L) 06/28/2019   NEUTROABS 1.7 06/28/2019    CMP  Lab Results  Component Value Date   NA 141 06/28/2019   K 4.5 06/28/2019   CL 106 06/28/2019   CO2 23 06/28/2019   GLUCOSE 122 (H) 06/28/2019   BUN 14 06/28/2019   CREATININE 0.77 06/28/2019   CALCIUM 9.4 06/28/2019   PROT 7.3 06/28/2019   ALBUMIN 4.0 06/28/2019   AST 81 (H) 06/28/2019   ALT 100 (H) 06/28/2019   ALKPHOS 216 (H) 06/28/2019   BILITOT 0.5 06/28/2019   GFRNONAA >60 06/28/2019   GFRAA >60 06/28/2019    Lab Results  Component Value Date   CEA1 4.01 04/25/2019    Medications: I have reviewed the patient's current medications.   Assessment/Plan: 1. Rectal cancer  Colonoscopy 02/15/2019-frond-like/villous, fungating, infiltrative and ulcerated partially obstructing large mass extending from the dentate line into the distal and mid rectum. Mass partially circumferential. Mass measured 9 cm in length. Biopsies showed invasive adenocarcinoma; preserved expression of the major and minor MMR proteins.   Upper endoscopy  02/15/2019-distal esophagitis, esophageal mucosal changes suspicious for long segment Barrett's esophagus. Biopsy of the duodenum showed peptic duodenitis with no dysplasia or malignancy; random gastric biopsy showed mild chronic gastritis and no intestinal metaplasia dysplasia or malignancy. Distal esophagus biopsy showed intestinal metaplasia consistent with Barrett's esophagus and no dysplasia or malignancy.  CTs 02/22/2019-wall thickening of the rectum. A few subcentimeter perirectal lymph nodes. Small bowel to small bowel intussusception within the jejunum which appears to partially resolve on delayed imaging. 2 mm nonspecific pulmonary nodule superior segment left lower lobe.  03/06/2019 lower endoscopic ultrasound-flex impression-malignant partially obstructing tumor in the proximal rectum and in the mid rectum, internal hemorrhoids. EUS impression-rectal mass visualized endosonography, staged T3N1. 1 malignant appearing lymph node was visualized in a peritumoral location. Endosonographic appearance suspicious for metastatic colorectal adenocarcinoma. Internal anal sphincter was visualized and appeared normal. No sign of significant pathology at the rectosigmoid junction and in the sigmoid colon. Prostate noted with calcification within it.  Cycle 1 FOLFOX 03/15/2019  Cycle 2 FOLFOX 03/28/2019  Cycle 3 FOLFOX 04/12/2019  Cycle 4 FOLFOX 04/25/2019  Cycle 5 FOLFOX 05/17/2019, Udenyca added  Cycle 6 FOLFOX 05/31/2019, Udenyca  Cycle 7 FOLFOX 06/14/2019, Udenyca held  Cycle 8 FOLFOX 06/28/2019 2. Change in bowel habits secondary to#08 Dec 2018 3. Rectal bleeding secondary to#08 Dec 2018 4. Weight loss 5. Port-A-Cath placement, Dr. Marcello Moores, 03/08/2019    Disposition: Jeffrey Day appears stable.  He will complete a final cycle of FOLFOX today.  He has mild neutropenia.  He  does not wish to receive G-CSF with this cycle.  He had significant bone pain with the G-CSF.  He will call for  symptoms of infection.  Jeffrey Day will begin concurrent capecitabine and radiation on 07/23/2019.  He will be seen for an office visit that day.  We reviewed potential toxicities associated with capecitabine including the chance for diarrhea and hand/foot syndrome.  He agrees to proceed.  We will check a CBC when he is seen by radiation on 07/12/2019.  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  He understands the symptoms could worsen over the next several months.  Betsy Coder, MD  06/28/2019  9:49 AM

## 2019-06-28 NOTE — Progress Notes (Signed)
Per Dr. Benay Spice, patient okay to receive treatment today with AST 81 and ALT 100.

## 2019-06-28 NOTE — Patient Instructions (Signed)
[facility] Discharge Instructions for Patients Receiving Chemotherapy  Today you received the following chemotherapy agents Oxaliplatin, Lecovorin, Fluorouracil  To help prevent nausea and vomiting after your treatment, we encourage you to take your nausea medication as directed by your MD   If you develop nausea and vomiting that is not controlled by your nausea medication, call the clinic.   BELOW ARE SYMPTOMS THAT SHOULD BE REPORTED IMMEDIATELY:  *FEVER GREATER THAN 100.5 F  *CHILLS WITH OR WITHOUT FEVER  NAUSEA AND VOMITING THAT IS NOT CONTROLLED WITH YOUR NAUSEA MEDICATION  *UNUSUAL SHORTNESS OF BREATH  *UNUSUAL BRUISING OR BLEEDING  TENDERNESS IN MOUTH AND THROAT WITH OR WITHOUT PRESENCE OF ULCERS  *URINARY PROBLEMS  *BOWEL PROBLEMS  UNUSUAL RASH Items with * indicate a potential emergency and should be followed up as soon as possible.  Feel free to call the clinic should you have any questions or concerns. The clinic phone number is [phone].  Please show the CHEMO ALERT CARD at check-in to the Emergency Department and triage nurse.  Coronavirus (COVID-19) Are you at risk?  Are you at risk for the Coronavirus (COVID-19)?  To be considered HIGH RISK for Coronavirus (COVID-19), you have to meet the following criteria:  . Traveled to [place], [place], [place], [place] or [place]; or in the [place] to [place], [place], [place], or [place]; and have fever, cough, and shortness of breath within the last 2 weeks of travel OR . Been in close contact with a person diagnosed with COVID-19 within the last 2 weeks and have fever, cough, and shortness of breath . IF YOU DO NOT MEET THESE CRITERIA, YOU ARE CONSIDERED LOW RISK FOR COVID-19.  What to do if you are HIGH RISK for COVID-19?  . If you are having a medical emergency, call 911. . Seek medical care right away. Before you go to a doctor's office, urgent care or emergency department, call  ahead and tell them about your recent travel, contact with someone diagnosed with COVID-19, and your symptoms. You should receive instructions from your physician's office regarding next steps of care.  . When you arrive at healthcare provider, tell the healthcare staff immediately you have returned from visiting [place], [place], [place], [place] or [place]; or traveled in the [place] to [place], [place], [place], or [place]; in the last two weeks or you have been in close contact with a person diagnosed with COVID-19 in the last 2 weeks.   . Tell the health care staff about your symptoms: fever, cough and shortness of breath. . After you have been seen by a medical provider, you will be either: o Tested for (COVID-19) and discharged home on quarantine except to seek medical care if symptoms worsen, and asked to  - Stay home and avoid contact with others until you get your results (4-5 days)  - Avoid travel on public transportation if possible (such as bus, train, or airplane) or o Sent to the Emergency Department by EMS for evaluation, COVID-19 testing, and possible admission depending on your condition and test results.  What to do if you are LOW RISK for COVID-19?  Reduce your risk of any infection by using the same precautions used for avoiding the common cold or flu:  . Wash your hands often with soap and warm water for at least 20 seconds.  If soap and water are not readily available, use an alcohol-based hand sanitizer with at least 60% alcohol.  .   If coughing or sneezing, cover your mouth and nose by coughing or sneezing into the elbow areas of your shirt or coat, into a tissue or into your sleeve (not your hands). . Avoid shaking hands with others and consider head nods or verbal greetings only. . Avoid touching your eyes, nose, or mouth with unwashed hands.  . Avoid close contact with people who are sick. . Avoid places or events with large numbers of people in one location,  like concerts or sporting events. . Carefully consider travel plans you have or are making. . If you are planning any travel outside or inside the [place], visit the CDC's Travelers' Health webpage for the latest health notices. . If you have some symptoms but not all symptoms, continue to monitor at home and seek medical attention if your symptoms worsen. . If you are having a medical emergency, call 911.   ADDITIONAL HEALTHCARE OPTIONS FOR PATIENTS  [facility] / e-Visit: [email or web address]         [facility]: [phone]  [facility]: [phone]                   [facility]: [phone]    

## 2019-06-29 ENCOUNTER — Telehealth: Payer: Self-pay | Admitting: Oncology

## 2019-06-29 ENCOUNTER — Telehealth: Payer: Self-pay | Admitting: Pharmacist

## 2019-06-29 DIAGNOSIS — C20 Malignant neoplasm of rectum: Secondary | ICD-10-CM

## 2019-06-29 MED ORDER — CAPECITABINE 500 MG PO TABS: 1500.0000 mg | ORAL_TABLET | Freq: Two times a day (BID) | ORAL | 0 refills | Status: DC

## 2019-06-29 NOTE — Progress Notes (Signed)
06/29/19  Discontinue Udenyca per Dr Benay Spice.  T.O. Dr Darletta Moll, PharmD

## 2019-06-29 NOTE — Telephone Encounter (Addendum)
Oral Oncology Pharmacist Encounter  Received new prescription for Xeloda (capecitabine) for the continued neoadjuvant treatment of stage III rectal cancer in conjunction with radiation, planned duration 5-6 weeks of concurrent chemoration. Planned start date 07/23/2019  Patient received neoadjuvant adjuvant  FOLFOX chemotherapy x 8 cycles (03/15/19-06/28/19) Capecitabine is planned to be initiated at ~746 mg/m2 (1500 mg) by mouth 2 times daily on days of radiation only  Labs from 06/28/2019 assessed, OK for treatment initiation.  Noted pltc=121k, this is secondary to chemotherapy, he will have labs repeated on 12/3 when he returns to see radiation oncology  Noted LFTs ~ 2x ULN, this elevation was 1st noted in early Oct 2020, likely secondary to chemotherapy, will continue to be monitored  No change to capecitabine dose is indicated at this time  Current medication list in Epic reviewed, moderate DDIs with capecitabine and omemprazole identified:  Category C interaction: conflicting retrospective data with one analysis showing decrease in OS and PFS when capecitabine was used for adjuvant treatment of gastric cancer concurrently with PPI, another study showing no effect on important efficacy outcomes. Patient will be screened for ability to discontinue PPI therapy. If unable to d/c PPI, no change to treatment is indicated.  Prescription has been e-scribed to the [facility] for benefits analysis and approval.  Oral Oncology Clinic will continue to follow for insurance authorization, copayment issues, initial counseling and start date.  Johny Drilling, PharmD, BCPS, BCOP  06/29/2019 7:07 AM Oral Oncology Clinic [phone]

## 2019-06-29 NOTE — Telephone Encounter (Signed)
Scheduled per los. Called and spoke with patient. Confirmed appt 

## 2019-06-29 NOTE — Telephone Encounter (Signed)
Oral Oncology Pharmacist Encounter  Insurance authorization for Xeloda (capecitabine) tablets submitted to [employer] [place] commercial insurance on Cover My Meds  Key: [id number] Status: pending  This encounter will continue to be updated until final determination.  Johny Drilling, PharmD, BCPS, BCOP  06/29/2019   10:26 AM Oral Oncology Clinic [phone]

## 2019-06-30 ENCOUNTER — Inpatient Hospital Stay: Payer: BC Managed Care – PPO

## 2019-06-30 VITALS — BP 130/87 | HR 78 | Temp 98.2°F | Resp 18

## 2019-06-30 DIAGNOSIS — R634 Abnormal weight loss: Secondary | ICD-10-CM | POA: Diagnosis not present

## 2019-06-30 DIAGNOSIS — C20 Malignant neoplasm of rectum: Secondary | ICD-10-CM | POA: Diagnosis not present

## 2019-06-30 DIAGNOSIS — Z5111 Encounter for antineoplastic chemotherapy: Secondary | ICD-10-CM | POA: Diagnosis not present

## 2019-06-30 MED ORDER — HEPARIN SOD (PORK) LOCK FLUSH 100 UNIT/ML IV SOLN
500.0000 [IU] | Freq: Once | INTRAVENOUS | Status: AC | PRN
  Administered 2019-06-30: 500 [IU]
  Filled 2019-06-30: qty 5

## 2019-06-30 MED ORDER — SODIUM CHLORIDE 0.9% FLUSH
10.0000 mL | INTRAVENOUS | Status: DC | PRN
  Administered 2019-06-30: 10 mL
  Filled 2019-06-30: qty 10

## 2019-06-30 NOTE — Patient Instructions (Signed)

## 2019-07-03 NOTE — Telephone Encounter (Signed)
Oral Oncology Pharmacist Encounter  Insurance authorization for Xeloda (capecitabine) tablets submitted to [facility] [employer] on Cover My Meds  Key: [id number] Status: approved Effective dates: 06/29/2019 - 06/27/2020  Johny Drilling, PharmD, BCPS, BCOP  07/03/2019   10:15 AM Oral Oncology Clinic [phone]

## 2019-07-08 ENCOUNTER — Other Ambulatory Visit: Payer: Self-pay | Admitting: Oncology

## 2019-07-12 ENCOUNTER — Other Ambulatory Visit: Payer: Self-pay

## 2019-07-12 ENCOUNTER — Ambulatory Visit
Admission: RE | Admit: 2019-07-12 | Discharge: 2019-07-12 | Disposition: A | Discharge status: Home / Self Care | Payer: BC Managed Care – PPO | Source: Ambulatory Visit | Attending: Radiation Oncology | Admitting: Radiation Oncology

## 2019-07-12 ENCOUNTER — Inpatient Hospital Stay: Payer: BC Managed Care – PPO | Attending: Oncology

## 2019-07-12 ENCOUNTER — Inpatient Hospital Stay: Payer: BC Managed Care – PPO

## 2019-07-12 DIAGNOSIS — Z79899 Other long term (current) drug therapy: Secondary | ICD-10-CM | POA: Diagnosis not present

## 2019-07-12 DIAGNOSIS — C20 Malignant neoplasm of rectum: Secondary | ICD-10-CM | POA: Diagnosis not present

## 2019-07-12 DIAGNOSIS — G629 Polyneuropathy, unspecified: Secondary | ICD-10-CM | POA: Insufficient documentation

## 2019-07-12 DIAGNOSIS — R634 Abnormal weight loss: Secondary | ICD-10-CM | POA: Insufficient documentation

## 2019-07-12 DIAGNOSIS — Z9221 Personal history of antineoplastic chemotherapy: Secondary | ICD-10-CM | POA: Insufficient documentation

## 2019-07-12 NOTE — Telephone Encounter (Signed)
Oral Chemotherapy Pharmacist Encounter   I spoke with patient for overview of: Xeloda (capecitabine) for the continued neoadjuvant treatment of stage III rectal cancer in conjunction with radiation, planned duration 5-6 weeks of concurrent chemoration.   Counseled patient on administration, dosing, side effects, monitoring, drug-food interactions, safe handling, storage, and disposal.  Patient will take Xeloda 500mg  tablets, 3 tablets (1500mg ) by mouth in AM and 3 tabs (1500mg ) by mouth in PM, within 30 minutes of finishing meals, on days of radiation only.  Xeloda and radiation start date: planned for 07/23/2019  Adverse effects of Xeloda include but are not limited to: fatigue, decreased blood counts, GI upset, diarrhea, mouth sores, and hand-foot syndrome.  Patient has anti-emetic on hand and knows to take it if nausea develops.   Patient will obtain anti diarrheal and alert the office of 4 or more loose stools above baseline.   Reviewed with patient importance of keeping a medication schedule and plan for any missed doses.  Medication reconciliation performed and medication/allergy list updated.  Insurance authorization for Xeloda has been obtained. Test claim at the pharmacy revealed copayment $0 for 1st fill of Xeloda. The 1st fill (#120 tablets) will ship from the [facility] on 07/16/19 to deliver to patient's home on 12/8.  Patient informed the pharmacy will reach out 5-7 days prior to needing next fill of Xeloda (remaining #48 tablets) to coordinate continued medication acquisition to prevent break in therapy.  All questions answered.  Jeffrey Day voiced understanding and appreciation.   Patient knows to call the office with questions or concerns.  Johny Drilling, PharmD, BCPS, BCOP  07/12/2019  1:59 PM Oral Oncology Clinic [phone]

## 2019-07-12 NOTE — Progress Notes (Signed)
Radiation Oncology         [phone]) [phone] ________________________________  Name: Jeffrey Day.        MRN: [id number]  Date of Service: 07/12/2019 DOB: January 19, 1976  [id number], Jeffrey Greenhouse, MD  Riki Sheer, MD     REFERRING PHYSICIAN: Riki Sheer, MD  DIAGNOSIS: The encounter diagnosis was Rectal cancer Jeffrey Day).   HISTORY OF PRESENT ILLNESS: Jeffrey Englert. is a 43 y.o. male with a history of rectal cancer.  The patient had been experiencing episodes of changes in bowel caliber and frequency as well as occasional rectal bleeding.  He began experiencing lower abdominal and pelvic pain and did not have improvement of his symptoms with MiraLAX.  He was referred to  Jeffrey Day and was evaluated by Dr. Rush Landmark, and underwent EGD and colonoscopy on 02/15/2019.  This revealed a partially obstructing tumor in the mid to distal rectum.  His EGD revealed distal esophagitis and mucosal changes consistent with Barrett's esophagus, and a biopsy of the duodenum revealed peptic duodenitis without dysplastic change or malignancy also mild gastritis was seen within the random gastric biopsyAnd in the rectum a frond-like villous/fungating infiltrative ulcerated partially obstructing large mass was seen extending from the dentate line into the proximal and mid rectum, involving two thirds of the luminal circumference.  Nonbleeding hemorrhoids were also noted.  A biopsy of the rectal mass revealed invasive adenocarcinoma.  The patient proceeded with CT imaging for staging purposes, and this was performed on 02/22/2019.  It revealed wall thickening in the rectum consistent with his newly diagnosed cancer and a few subcentimeter perirectal lymph nodes.  There was small bowel to small bowel intussusception within the jejunum felt to be transient, as well as a nonspecific 2 mm pulmonary nodule in the left lower lobe.  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  His EUS was performed on 03/06/2019, and the mass measured 8 cm in length encountered 1 cm from the anal verge, involving 70% of the lumen there was evidence suggesting breakthrough of the muscularis propria with invasion into the perirectal fat but not into the internal anal sphincter, 1 lymph node was observed measuring 10.5 x 8.4 mm and this was staged as T3N1 disease. He completed 8 cycles of FOLFOX on 06/27/2019 and is seen today via MyChart to discuss chemoRT. He will then proceed with resection with Dr. Marcello Moores.   PREVIOUS RADIATION THERAPY: No   PAST MEDICAL HISTORY:  Past Medical History:  Diagnosis Date   Allergy    Rectal cancer ([place])        PAST SURGICAL HISTORY: Past Surgical History:  Procedure Laterality Date   COLONOSCOPY WITH ESOPHAGOGASTRODUODENOSCOPY (EGD)     biopsy   EUS N/A 03/06/2019   Procedure: LOWER ENDOSCOPIC ULTRASOUND (EUS);  Surgeon: Irving Copas., MD;  Location: [place];  Service: Gastroenterology;  Laterality: N/A;   FINGER SURGERY  2013   Traumatic Amputation    FLEXIBLE SIGMOIDOSCOPY N/A 03/06/2019   Procedure: FLEXIBLE SIGMOIDOSCOPY;  Surgeon: Jeffrey Landmark Telford Nab., MD;  Location: [place];  Service: Gastroenterology;  Laterality: N/A;   PORTACATH PLACEMENT N/A 03/08/2019   Procedure: INSERTION PORT-A-CATH WITH ULTRASOUND GUIDANCE;  Surgeon: Leighton Ruff, MD;  Location: [facility];  Service: General;  Laterality: N/A;  LMA     FAMILY HISTORY:  Family  History  Problem Relation Age of Onset   Lung cancer Paternal Grandmother    Heart disease Maternal Grandmother    Hypertension Neg Hx    Stroke Neg Hx    Colon cancer Neg Hx    Esophageal cancer Neg Hx    Colitis Neg Hx    Inflammatory bowel disease Neg Hx    Liver disease Neg Hx    Pancreatic cancer Neg Hx    Rectal cancer Neg Hx    Stomach cancer Neg Hx       SOCIAL HISTORY:  reports that he has never smoked. His smokeless tobacco use includes chew. He reports current alcohol use. He reports that he does not use drugs. The patient is married and lives in [place]. He has an 76 year old son and is very active. 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   ALLERGIES: Penicillins   MEDICATIONS:  Current Outpatient Medications  Medication Sig Dispense Refill   acetaminophen (TYLENOL) 325 MG tablet Take 650 mg by mouth every 6 (six) hours as needed.     omeprazole (PRILOSEC) 40 MG capsule Take 1 capsule (40 mg total) by mouth 2 (two) times daily at 8 am and 10 pm. (Patient taking differently: Take 40 mg by mouth 2 (two) times a day. ) 60 capsule 6   polyethylene glycol (MIRALAX / GLYCOLAX) 17 g packet Take 17 g by mouth daily.     capecitabine (XELODA) 500 MG tablet Take 3 tablets (1,500 mg total) by mouth 2 (two) times daily after a meal. Take on days of radiation only, M-F (Patient not taking: Reported on 07/12/2019) 168 tablet 0   prochlorperazine (COMPAZINE) 10 MG tablet Take 1 tablet (10 mg total) by mouth every 6 (six) hours as needed for nausea or vomiting. (Patient not taking: Reported on 07/12/2019) 30 tablet 0   No current facility-administered medications for this encounter.      REVIEW OF SYSTEMS: On review of systems, the patient reports that he is doing well overall. He denies any chest pain, shortness of breath, cough, fevers, chills, night sweats, unintended weight changes. He reports his bowels are moving well without difficulty. He also reports he is not having any rectal bleeding or discharge, or pelvic pain. He denies any bladder disturbances, and denies abdominal pain, nausea or vomiting. He denies any new musculoskeletal or joint aches or pains. A complete review of systems is obtained and is otherwise negative.     PHYSICAL EXAM:  Vitals unable to be obtained. In  general this is a well appearing caucasian male in no acute distress. He's alert and oriented x4 and appropriate throughout the examination. Cardiopulmonary assessment is negative for acute distress and he exhibits normal effort.    ECOG = 1  0 - Asymptomatic (Fully active, able to carry on all predisease activities without restriction)  1 - Symptomatic but completely ambulatory (Restricted in physically strenuous activity but ambulatory and able to carry out work of a light or sedentary nature. For example, light housework, office work)  2 - Symptomatic, <50% in bed during the day (Ambulatory and capable of all self care but unable to carry out any work activities. Up and about more than 50% of waking hours)  3 - Symptomatic, >50% in bed, but not bedbound (Capable of only limited self-care, confined to bed or chair 50% or more of waking hours)  4 - Bedbound (Completely disabled. Cannot carry on any  self-care. Totally confined to bed or chair)  5 - Death   Eustace Pen MM, Creech RH, Tormey DC, et al. [phone]). "Toxicity and response criteria of the [facility]". [place] Oncol. 5 (6): 649-55    LABORATORY DATA:  Lab Results  Component Value Date   WBC 3.1 (L) 06/28/2019   HGB 12.8 (L) 06/28/2019   HCT 37.7 (L) 06/28/2019   MCV 95.2 06/28/2019   PLT 121 (L) 06/28/2019   Lab Results  Component Value Date   NA 141 06/28/2019   K 4.5 06/28/2019   CL 106 06/28/2019   CO2 23 06/28/2019   Lab Results  Component Value Date   ALT 100 (H) 06/28/2019   AST 81 (H) 06/28/2019   ALKPHOS 216 (H) 06/28/2019   BILITOT 0.5 06/28/2019      RADIOGRAPHY: No results found.     IMPRESSION/PLAN: 1. Stage IIIB, cT3N1M0 invasive adenocarcinoma of the rectum. Dr. Lisbeth Renshaw discusses the pathology findings and reviews the nature of rectal carcinoma. He has done well with systemic chemotherapy and is ready to proceed with ChemoRT.  We discussed the risks, benefits, short, and long  term effects of radiotherapy, and the patient is interested in proceeding at the appropriate interval. Dr. Lisbeth Renshaw discusses the delivery and logistics of radiotherapy and anticipates a course of 5 1/2 weeks of radiotherapy. He will come tomorrow for simulation and we anticipate starting therapy on 07/23/2019. He is in agreement with this plan.   This encounter was provided by telemedicine platform MyChart.  The patient has given verbal consent for this type of encounter and has been advised to only accept a meeting of this type in a secure network environment. The time spent during this encounter was 30 minutes. The attendants for this meeting include Dr. Lisbeth Renshaw, Hayden Pedro  and Jeffrey Day..  During the encounter, Dr. Lisbeth Renshaw, and Hayden Pedro were located at [facility] Radiation Oncology Department.  Jeffrey Day. was located at work.   The above documentation reflects my direct findings during this shared patient visit. Please see the separate note by Dr. Lisbeth Renshaw on this date for the remainder of the patient's plan of care.    Carola Rhine, PAC

## 2019-07-13 ENCOUNTER — Other Ambulatory Visit: Payer: Self-pay

## 2019-07-13 ENCOUNTER — Encounter: Payer: Self-pay | Admitting: Nurse Practitioner

## 2019-07-13 ENCOUNTER — Ambulatory Visit
Admission: RE | Admit: 2019-07-13 | Discharge: 2019-07-13 | Disposition: A | Discharge status: Home / Self Care | Payer: BC Managed Care – PPO | Source: Ambulatory Visit | Attending: Radiation Oncology | Admitting: Radiation Oncology

## 2019-07-13 DIAGNOSIS — Z510 Encounter for antineoplastic radiation therapy: Secondary | ICD-10-CM | POA: Diagnosis not present

## 2019-07-13 DIAGNOSIS — C20 Malignant neoplasm of rectum: Secondary | ICD-10-CM | POA: Diagnosis not present

## 2019-07-16 MED FILL — CAPECITABINE 500 MG TABS: 500 | 28 days supply | Qty: 120 | Fill #0

## 2019-07-17 ENCOUNTER — Telehealth: Payer: Self-pay | Admitting: Nurse Practitioner

## 2019-07-17 ENCOUNTER — Encounter: Payer: Self-pay | Admitting: *Deleted

## 2019-07-17 NOTE — Telephone Encounter (Signed)
I talk with patient regarding schedule  

## 2019-07-17 NOTE — Progress Notes (Signed)
  Radiation Oncology         [phone]) [phone] ________________________________  Name: Jeffrey Day. MRN: [id number]  Date: 07/13/2019  DOB: 09-Jun-1976  Optical Surface Tracking Plan:  Since intensity modulated radiotherapy (IMRT) and 3D conformal radiation treatment methods are predicated on accurate and precise positioning for treatment, intrafraction motion monitoring is medically necessary to ensure accurate and safe treatment delivery.  The ability to quantify intrafraction motion without excessive ionizing radiation dose can only be performed with optical surface tracking. Accordingly, surface imaging offers the opportunity to obtain 3D measurements of patient position throughout IMRT and 3D treatments without excessive radiation exposure.  I am ordering optical surface tracking for this patient's upcoming course of radiotherapy. ________________________________  Kyung Rudd, MD 07/17/2019 4:54 PM    Reference:   Ursula Alert, J, et al. Surface imaging-based analysis of intrafraction motion for breast radiotherapy patients.Journal of [place], n. 6, nov. 2014. ISSN [id number].   Available at: <http://www.jacmp.org/index.php/jacmp/article/view/4957>.

## 2019-07-17 NOTE — Progress Notes (Signed)
  Radiation Oncology         [phone]) [phone] ________________________________  Name: Jeffrey Day. MRN: [id number]  Date: 07/13/2019  DOB: 28-Feb-1976   SIMULATION AND TREATMENT PLANNING NOTE  DIAGNOSIS:     ICD-10-CM   1. Rectal cancer ([place])  C20      The patient presented for simulation for the patient's upcoming course of radiation for the diagnosis of rectal cancer. The patient was placed in a supine position. A customized vac-lock bag was constructed to aid in patient immobilization on. This complex treatment device will be used on a daily basis during the treatment. In this fashion a CT scan was obtained through the pelvic region and the isocenter was placed near midline within the pelvis. Surface markings were placed.  The patient's imaging was loaded into the radiation treatment planning system. The patient will initially be planned to receive a course of radiation to a dose of 45 Gy. This will be accomplished in 25 fractions at 1.8 gray per fraction. This initial treatment will correspond to a 3-D conformal technique. The target has been contoured in addition to the rectum, bladder and femoral heads. Dose volume histograms of each of these structures have been requested and these will be carefully reviewed as part of the 3-D conformal treatment planning process. To accomplish this initial treatment, 4 customized blocks have been designed for this purpose. Each of these 4 complex treatment devices will be used on a daily basis during the initial course of the treatment. It is anticipated that the patient will then receive a boost for an additional 5.4 Gy. The anticipated total dose therefore will be 50.4 Gy.    Special treatment procedure The patient will receive chemotherapy during the course of radiation treatment. The patient may experience increased or overlapping toxicity due to this combined-modality approach and the patient will be monitored for such problems. This may  include extra lab work as necessary. This therefore constitutes a special treatment procedure.    ________________________________  Jodelle Gross, MD, PhD

## 2019-07-17 NOTE — Progress Notes (Signed)
Radiation oncology inquired if he had his labs last week--noted that he was a "no show" for his flush/lab. Scheduling message sent to get him in this week.

## 2019-07-19 ENCOUNTER — Inpatient Hospital Stay: Payer: BC Managed Care – PPO

## 2019-07-19 ENCOUNTER — Other Ambulatory Visit: Payer: Self-pay

## 2019-07-19 DIAGNOSIS — Z95828 Presence of other vascular implants and grafts: Secondary | ICD-10-CM

## 2019-07-19 DIAGNOSIS — Z9221 Personal history of antineoplastic chemotherapy: Secondary | ICD-10-CM | POA: Diagnosis not present

## 2019-07-19 DIAGNOSIS — C20 Malignant neoplasm of rectum: Secondary | ICD-10-CM

## 2019-07-19 DIAGNOSIS — Z510 Encounter for antineoplastic radiation therapy: Secondary | ICD-10-CM | POA: Diagnosis not present

## 2019-07-19 DIAGNOSIS — R634 Abnormal weight loss: Secondary | ICD-10-CM | POA: Diagnosis not present

## 2019-07-19 DIAGNOSIS — G629 Polyneuropathy, unspecified: Secondary | ICD-10-CM | POA: Diagnosis not present

## 2019-07-19 LAB — CBC WITH DIFFERENTIAL (CANCER CENTER ONLY)
Abs Immature Granulocytes: 0.02 10*3/uL (ref 0.00–0.07)
Basophils Absolute: 0.1 10*3/uL (ref 0.0–0.1)
Basophils Relative: 2 %
Eosinophils Absolute: 0 10*3/uL (ref 0.0–0.5)
Eosinophils Relative: 1 %
HCT: 43.3 % (ref 39.0–52.0)
Hemoglobin: 14.5 g/dL (ref 13.0–17.0)
Immature Granulocytes: 1 %
Lymphocytes Relative: 18 %
Lymphs Abs: 0.8 10*3/uL (ref 0.7–4.0)
MCH: 32.9 pg (ref 26.0–34.0)
MCHC: 33.5 g/dL (ref 30.0–36.0)
MCV: 98.2 fL (ref 80.0–100.0)
Monocytes Absolute: 0.8 10*3/uL (ref 0.1–1.0)
Monocytes Relative: 19 %
Neutro Abs: 2.5 10*3/uL (ref 1.7–7.7)
Neutrophils Relative %: 59 %
Platelet Count: 200 10*3/uL (ref 150–400)
RBC: 4.41 MIL/uL (ref 4.22–5.81)
RDW: 15.5 % (ref 11.5–15.5)
WBC Count: 4.1 10*3/uL (ref 4.0–10.5)
nRBC: 0 % (ref 0.0–0.2)

## 2019-07-19 MED ORDER — HEPARIN SOD (PORK) LOCK FLUSH 100 UNIT/ML IV SOLN
500.0000 [IU] | Freq: Once | INTRAVENOUS | Status: AC | PRN
  Administered 2019-07-19: 09:00:00 500 [IU]
  Filled 2019-07-19: qty 5

## 2019-07-19 MED ORDER — SODIUM CHLORIDE 0.9% FLUSH
10.0000 mL | INTRAVENOUS | Status: DC | PRN
  Administered 2019-07-19: 10 mL
  Filled 2019-07-19: qty 10

## 2019-07-23 ENCOUNTER — Ambulatory Visit
Admission: RE | Admit: 2019-07-23 | Discharge: 2019-07-23 | Disposition: A | Discharge status: Home / Self Care | Payer: BC Managed Care – PPO | Source: Ambulatory Visit | Attending: Radiation Oncology | Admitting: Radiation Oncology

## 2019-07-23 ENCOUNTER — Inpatient Hospital Stay (HOSPITAL_BASED_OUTPATIENT_CLINIC_OR_DEPARTMENT_OTHER): Payer: BC Managed Care – PPO | Admitting: Nurse Practitioner

## 2019-07-23 ENCOUNTER — Encounter: Payer: Self-pay | Admitting: Nurse Practitioner

## 2019-07-23 ENCOUNTER — Other Ambulatory Visit: Payer: Self-pay

## 2019-07-23 VITALS — BP 117/81 | HR 91 | Temp 99.1°F | Resp 18 | Ht 69.0 in | Wt 185.4 lb

## 2019-07-23 DIAGNOSIS — C20 Malignant neoplasm of rectum: Secondary | ICD-10-CM

## 2019-07-23 DIAGNOSIS — R634 Abnormal weight loss: Secondary | ICD-10-CM | POA: Diagnosis not present

## 2019-07-23 DIAGNOSIS — Z9221 Personal history of antineoplastic chemotherapy: Secondary | ICD-10-CM | POA: Diagnosis not present

## 2019-07-23 DIAGNOSIS — Z510 Encounter for antineoplastic radiation therapy: Secondary | ICD-10-CM | POA: Diagnosis not present

## 2019-07-23 DIAGNOSIS — G629 Polyneuropathy, unspecified: Secondary | ICD-10-CM | POA: Diagnosis not present

## 2019-07-23 MED ORDER — NYSTATIN 100000 UNIT/ML MT SUSP: 5.0000 mL | Freq: Four times a day (QID) | OROMUCOSAL | 0 refills | Status: DC

## 2019-07-23 NOTE — Patient Instructions (Addendum)
 Capecitabine tablets What is this medicine? CAPECITABINE (ka pe SITE a been) is a chemotherapy drug. It slows the growth of cancer cells. This medicine is used to treat breast cancer, and also colon or rectal cancer. This medicine may be used for other purposes; ask your health care provider or pharmacist if you have questions. COMMON BRAND NAME(S): Xeloda What should I tell my health care provider before I take this medicine? They need to know if you have any of these conditions:  bleeding disorders  dehydration  dihydropyrimidine dehydrogenase (DPD) deficiency  heart disease  infection (especially a virus infection such as chickenpox, cold sores, or herpes)  kidney disease  liver disease  low blood counts, like low white cell, platelet, or red cell counts  an unusual or allergic reaction to capecitabine, fluorouracil, other medicines, foods, dyes, or preservatives  pregnant or trying to get pregnant  breast-feeding How should I use this medicine? Take this medicine by mouth with a glass of water, within 30 minutes of the end of a meal. Do not cut, crush or chew this medicine. Follow the directions on the prescription label. Take your medicine at regular intervals. Do not take it more often than directed. Do not stop taking except on your doctor's advice. Your doctor may want you to take a combination of 150 mg and 500 mg tablets for each dose. It is very important that you know how to correctly take your dose. Taking the wrong tablets could result in an overdose (too much medication) or underdose (too little medication). Talk to your pediatrician regarding the use of this medicine in children. Special care may be needed. Overdosage: If you think you have taken too much of this medicine contact a poison control center or emergency room at once. NOTE: This medicine is only for you. Do not share this medicine with others. What if I miss a dose? If you miss a dose, do not take  the missed dose at all. Do not take double or extra doses. Instead, continue with your next scheduled dose and check with your doctor. What may interact with this medicine? This medicine may interact with the following medications:  allopurinol  leucovorin  phenytoin  warfarin This list may not describe all possible interactions. Give your health care provider a list of all the medicines, herbs, non-prescription drugs, or dietary supplements you use. Also tell them if you smoke, drink alcohol, or use illegal drugs. Some items may interact with your medicine. What should I watch for while using this medicine? Visit your doctor for checks on your progress. This drug may make you feel generally unwell. This is not uncommon, as chemotherapy can affect healthy cells as well as cancer cells. Report any side effects. Continue your course of treatment even though you feel ill unless your doctor tells you to stop. In some cases, you may be given additional medicines to help with side effects. Follow all directions for their use. Call your doctor or health care professional for advice if you get a fever, chills or sore throat, or other symptoms of a cold or flu. Do not treat yourself. This drug decreases your body's ability to fight infections. Try to avoid being around people who are sick. This medicine may increase your risk to bruise or bleed. Call your doctor or health care professional if you notice any unusual bleeding. Be careful brushing and flossing your teeth or using a toothpick because you may get an infection or bleed more easily. If   have any dental work done, tell your dentist you are receiving this medicine. Avoid taking products that contain aspirin, acetaminophen, ibuprofen, naproxen, or ketoprofen unless instructed by your doctor. These medicines may hide a fever. Do not become pregnant while taking this medicine or for 6 months after stopping it. Women should inform their doctor if they  wish to become pregnant or think they might be pregnant. There is a potential for serious side effects to an unborn child. Talk to your health care professional or pharmacist for more information. Do not breast-feed an infant while taking this medicine or for 2 weeks after stopping it. Men are advised not to father a child while taking this medicine or for 3 months after stopping it. This medicine may make it more difficult to get pregnant or father a child. Talk with your doctor or health care professional if you are concerned about your fertility. What side effects may I notice from receiving this medicine? Side effects that you should report to your doctor or health care professional as soon as possible:  allergic reactions like skin rash, itching or hives, swelling of the face, lips, or tongue  diarrhea  low blood counts - this medicine may decrease the number of white blood cells, red blood cells and platelets. You may be at increased risk for infections and bleeding  nausea, vomiting  redness, blistering, peeling or loosening of the skin, including inside the mouth (this can be added for any serious or exfoliative rash that could lead to hospitalization)  redness, swelling, or sores on hands or feet  signs and symptoms of kidney injury like trouble passing urine or change in the amount of urine  signs and symptoms of liver injury like dark yellow or brown urine; general ill feeling or flu-like symptoms; light-colored stools; loss of appetite; nausea; right upper belly pain; unusually weak or tired; yellowing of the eyes or skin  signs of decreased platelets or bleeding - bruising, pinpoint red spots on the skin, black, tarry stools, blood in the urine  signs of decreased red blood cells - unusually weak or tired, fainting spells, lightheadedness  signs of infection - fever or chills, cough, sore throat, pain or difficulty passing urine Side effects that usually do not require medical  attention (report to your doctor or health care professional if they continue or are bothersome):  changes in vision  constipation  loss of appetite  mouth sores  pain, tingling, numbness in the hands or feet This list may not describe all possible side effects. Call your doctor for medical advice about side effects. You may report side effects to FDA at 1-800-FDA-1088. Where should I keep my medicine? Keep out of the reach of children. Store at room temperature between 15 and 30 degrees C (59 and 86 degrees F). Keep container tightly closed. Throw away any unused medicine after the expiration date. NOTE: This sheet is a summary. It may not cover all possible information. If you have questions about this medicine, talk to your doctor, pharmacist, or health care provider.  2020 [employer]vier/Gold Standard (2017-11-08 21:22:45) Capecitabine tablets What is this medicine? CAPECITABINE (ka pe SITE a been) is a chemotherapy drug. It slows the growth of cancer cells. This medicine is used to treat breast cancer, and also colon or rectal cancer. This medicine may be used for other purposes; ask your health care provider or pharmacist if you have questions. COMMON BRAND NAME(S): Xeloda What should I tell my health care provider before I take  this medicine? They need to know if you have any of these conditions:  bleeding disorders  dehydration  dihydropyrimidine dehydrogenase (DPD) deficiency  heart disease  infection (especially a virus infection such as chickenpox, cold sores, or herpes)  kidney disease  liver disease  low blood counts, like low white cell, platelet, or red cell counts  an unusual or allergic reaction to capecitabine, fluorouracil, other medicines, foods, dyes, or preservatives  pregnant or trying to get pregnant  breast-feeding How should I use this medicine? Take this medicine by mouth with a glass of water, within 30 minutes of the end of a meal. Do not cut,  crush or chew this medicine. Follow the directions on the prescription label. Take your medicine at regular intervals. Do not take it more often than directed. Do not stop taking except on your doctor's advice. Your doctor may want you to take a combination of 150 mg and 500 mg tablets for each dose. It is very important that you know how to correctly take your dose. Taking the wrong tablets could result in an overdose (too much medication) or underdose (too little medication). Talk to your pediatrician regarding the use of this medicine in children. Special care may be needed. Overdosage: If you think you have taken too much of this medicine contact a poison control center or emergency room at once. NOTE: This medicine is only for you. Do not share this medicine with others. What if I miss a dose? If you miss a dose, do not take the missed dose at all. Do not take double or extra doses. Instead, continue with your next scheduled dose and check with your doctor. What may interact with this medicine? This medicine may interact with the following medications:  allopurinol  leucovorin  phenytoin  warfarin This list may not describe all possible interactions. Give your health care provider a list of all the medicines, herbs, non-prescription drugs, or dietary supplements you use. Also tell them if you smoke, drink alcohol, or use illegal drugs. Some items may interact with your medicine. What should I watch for while using this medicine? Visit your doctor for checks on your progress. This drug may make you feel generally unwell. This is not uncommon, as chemotherapy can affect healthy cells as well as cancer cells. Report any side effects. Continue your course of treatment even though you feel ill unless your doctor tells you to stop. In some cases, you may be given additional medicines to help with side effects. Follow all directions for their use. Call your doctor or health care professional for  advice if you get a fever, chills or sore throat, or other symptoms of a cold or flu. Do not treat yourself. This drug decreases your body's ability to fight infections. Try to avoid being around people who are sick. This medicine may increase your risk to bruise or bleed. Call your doctor or health care professional if you notice any unusual bleeding. Be careful brushing and flossing your teeth or using a toothpick because you may get an infection or bleed more easily. If you have any dental work done, tell your dentist you are receiving this medicine. Avoid taking products that contain aspirin, acetaminophen, ibuprofen, naproxen, or ketoprofen unless instructed by your doctor. These medicines may hide a fever. Do not become pregnant while taking this medicine or for 6 months after stopping it. Women should inform their doctor if they wish to become pregnant or think they might be pregnant. There is a  potential for serious side effects to an unborn child. Talk to your health care professional or pharmacist for more information. Do not breast-feed an infant while taking this medicine or for 2 weeks after stopping it. Men are advised not to father a child while taking this medicine or for 3 months after stopping it. This medicine may make it more difficult to get pregnant or father a child. Talk with your doctor or health care professional if you are concerned about your fertility. What side effects may I notice from receiving this medicine? Side effects that you should report to your doctor or health care professional as soon as possible:  allergic reactions like skin rash, itching or hives, swelling of the face, lips, or tongue  diarrhea  low blood counts - this medicine may decrease the number of white blood cells, red blood cells and platelets. You may be at increased risk for infections and bleeding  nausea, vomiting  redness, blistering, peeling or loosening of the skin, including inside the  mouth (this can be added for any serious or exfoliative rash that could lead to hospitalization)  redness, swelling, or sores on hands or feet  signs and symptoms of kidney injury like trouble passing urine or change in the amount of urine  signs and symptoms of liver injury like dark yellow or brown urine; general ill feeling or flu-like symptoms; light-colored stools; loss of appetite; nausea; right upper belly pain; unusually weak or tired; yellowing of the eyes or skin  signs of decreased platelets or bleeding - bruising, pinpoint red spots on the skin, black, tarry stools, blood in the urine  signs of decreased red blood cells - unusually weak or tired, fainting spells, lightheadedness  signs of infection - fever or chills, cough, sore throat, pain or difficulty passing urine Side effects that usually do not require medical attention (report to your doctor or health care professional if they continue or are bothersome):  changes in vision  constipation  loss of appetite  mouth sores  pain, tingling, numbness in the hands or feet This list may not describe all possible side effects. Call your doctor for medical advice about side effects. You may report side effects to FDA at 1-800-FDA-1088. Where should I keep my medicine? Keep out of the reach of children. Store at room temperature between 15 and 30 degrees C (59 and 86 degrees F). Keep container tightly closed. Throw away any unused medicine after the expiration date. NOTE: This sheet is a summary. It may not cover all possible information. If you have questions about this medicine, talk to your doctor, pharmacist, or health care provider.  2020 Elsevier/Gold Standard (2017-11-08 21:22:45)

## 2019-07-23 NOTE — Progress Notes (Addendum)
  [place] OFFICE PROGRESS NOTE   Diagnosis: Rectal cancer  INTERVAL HISTORY:   Mr. Bedel returns as scheduled.  He notes an alteration in taste.  No mouth sores.  No nausea or vomiting.  No diarrhea.  He denies bleeding.  Neuropathy symptoms have lessened.  Objective:  Vital signs in last 24 hours:  Blood pressure 117/81, pulse 91, temperature 99.1 F (37.3 C), temperature source Temporal, resp. rate 18, height '5\' 9"'$  (1.753 m), weight 185 lb 6.4 oz (84.1 kg), SpO2 99 %.    HEENT: White coating over tongue.  No buccal thrush.  No ulcers. GI: Abdomen soft and nontender.  No hepatomegaly. Vascular: No leg edema.  Calves soft and nontender. Neuro: Alert and oriented. Skin: Palms with mild erythema. Port-A-Cath without erythema.   Lab Results:  Lab Results  Component Value Date   WBC 4.1 07/19/2019   HGB 14.5 07/19/2019   HCT 43.3 07/19/2019   MCV 98.2 07/19/2019   PLT 200 07/19/2019   NEUTROABS 2.5 07/19/2019    Imaging:  No results found.  Medications: I have reviewed the patient's current medications.  Assessment/Plan: 1. Rectal cancer  Colonoscopy 02/15/2019-frond-like/villous, fungating, infiltrative and ulcerated partially obstructing large mass extending from the dentate line into the distal and mid rectum. Mass partially circumferential. Mass measured 9 cm in length. Biopsies showed invasive adenocarcinoma; preserved expression of the major and minor MMR proteins.   Upper endoscopy 02/15/2019-distal esophagitis, esophageal mucosal changes suspicious for long segment Barrett's esophagus. Biopsy of the duodenum showed peptic duodenitis with no dysplasia or malignancy; random gastric biopsy showed mild chronic gastritis and no intestinal metaplasia dysplasia or malignancy. Distal esophagus biopsy showed intestinal metaplasia consistent with Barrett's esophagus and no dysplasia or malignancy.  CTs 02/22/2019-wall thickening of the rectum. A few  subcentimeter perirectal lymph nodes. Small bowel to small bowel intussusception within the jejunum which appears to partially resolve on delayed imaging. 2 mm nonspecific pulmonary nodule superior segment left lower lobe.  03/06/2019 lower endoscopic ultrasound-flex impression-malignant partially obstructing tumor in the proximal rectum and in the mid rectum, internal hemorrhoids. EUS impression-rectal mass visualized endosonography, staged T3N1. 1 malignant appearing lymph node was visualized in a peritumoral location. Endosonographic appearance suspicious for metastatic colorectal adenocarcinoma. Internal anal sphincter was visualized and appeared normal. No sign of significant pathology at the rectosigmoid junction and in the sigmoid colon. Prostate noted with calcification within it.  Cycle 1 FOLFOX 03/15/2019  Cycle 2 FOLFOX 03/28/2019  Cycle 3 FOLFOX 04/12/2019  Cycle 4 FOLFOX 04/25/2019  Cycle 5 FOLFOX 05/17/2019, Udenyca added  Cycle 6 FOLFOX 05/31/2019, Udenyca  Cycle 7 FOLFOX 06/14/2019, Udenyca held  Cycle 8 FOLFOX 06/28/2019  Radiation/Xeloda 07/23/2019 2. Change in bowel habits secondary to#08 Dec 2018 3. Rectal bleeding secondary to#08 Dec 2018 4. Weight loss 5. Port-A-Cath placement, Dr. Marcello Moores, 03/08/2019    Disposition: Mr. Kittleson appears stable.  He has completed 8 cycles of FOLFOX.  He is scheduled to begin radiation/Xeloda today.  We again reviewed potential toxicities associated with Xeloda.  He agrees to proceed.  He has thrush.  He will complete a course of nystatin oral solution.  He will return for lab and follow-up in 2 weeks.  He will contact the office in the interim with any problems.    [name] ANP/GNP-BC   07/23/2019  3:19 PM

## 2019-07-24 ENCOUNTER — Other Ambulatory Visit: Payer: Self-pay

## 2019-07-24 ENCOUNTER — Ambulatory Visit
Admission: RE | Admit: 2019-07-24 | Discharge: 2019-07-24 | Disposition: A | Discharge status: Home / Self Care | Payer: BC Managed Care – PPO | Source: Ambulatory Visit | Attending: Radiation Oncology | Admitting: Radiation Oncology

## 2019-07-24 ENCOUNTER — Telehealth: Payer: Self-pay | Admitting: Oncology

## 2019-07-24 DIAGNOSIS — C20 Malignant neoplasm of rectum: Secondary | ICD-10-CM | POA: Diagnosis not present

## 2019-07-24 DIAGNOSIS — Z510 Encounter for antineoplastic radiation therapy: Secondary | ICD-10-CM | POA: Diagnosis not present

## 2019-07-25 ENCOUNTER — Ambulatory Visit
Admission: RE | Admit: 2019-07-25 | Discharge: 2019-07-25 | Disposition: A | Discharge status: Home / Self Care | Payer: BC Managed Care – PPO | Source: Ambulatory Visit | Attending: Radiation Oncology | Admitting: Radiation Oncology

## 2019-07-25 ENCOUNTER — Other Ambulatory Visit: Payer: Self-pay

## 2019-07-25 DIAGNOSIS — Z510 Encounter for antineoplastic radiation therapy: Secondary | ICD-10-CM | POA: Diagnosis not present

## 2019-07-25 DIAGNOSIS — C20 Malignant neoplasm of rectum: Secondary | ICD-10-CM | POA: Diagnosis not present

## 2019-07-26 ENCOUNTER — Other Ambulatory Visit: Payer: Self-pay

## 2019-07-26 ENCOUNTER — Ambulatory Visit
Admission: RE | Admit: 2019-07-26 | Discharge: 2019-07-26 | Disposition: A | Discharge status: Home / Self Care | Payer: BC Managed Care – PPO | Source: Ambulatory Visit | Attending: Radiation Oncology | Admitting: Radiation Oncology

## 2019-07-26 DIAGNOSIS — Z510 Encounter for antineoplastic radiation therapy: Secondary | ICD-10-CM | POA: Diagnosis not present

## 2019-07-26 DIAGNOSIS — C20 Malignant neoplasm of rectum: Secondary | ICD-10-CM | POA: Diagnosis not present

## 2019-07-27 ENCOUNTER — Other Ambulatory Visit: Payer: Self-pay

## 2019-07-27 ENCOUNTER — Ambulatory Visit
Admission: RE | Admit: 2019-07-27 | Discharge: 2019-07-27 | Disposition: A | Discharge status: Home / Self Care | Payer: BC Managed Care – PPO | Source: Ambulatory Visit | Attending: Radiation Oncology | Admitting: Radiation Oncology

## 2019-07-27 DIAGNOSIS — Z510 Encounter for antineoplastic radiation therapy: Secondary | ICD-10-CM | POA: Diagnosis not present

## 2019-07-27 DIAGNOSIS — C20 Malignant neoplasm of rectum: Secondary | ICD-10-CM

## 2019-07-27 MED ORDER — SONAFINE EX EMUL
1.0000 "application " | Freq: Two times a day (BID) | CUTANEOUS | Status: DC
  Administered 2019-07-27: 1 via TOPICAL

## 2019-07-30 ENCOUNTER — Ambulatory Visit
Admission: RE | Admit: 2019-07-30 | Discharge: 2019-07-30 | Disposition: A | Discharge status: Home / Self Care | Payer: BC Managed Care – PPO | Source: Ambulatory Visit | Attending: Radiation Oncology | Admitting: Radiation Oncology

## 2019-07-30 ENCOUNTER — Other Ambulatory Visit: Payer: Self-pay

## 2019-07-30 DIAGNOSIS — Z510 Encounter for antineoplastic radiation therapy: Secondary | ICD-10-CM | POA: Diagnosis not present

## 2019-07-30 DIAGNOSIS — C20 Malignant neoplasm of rectum: Secondary | ICD-10-CM | POA: Diagnosis not present

## 2019-07-31 ENCOUNTER — Ambulatory Visit
Admission: RE | Admit: 2019-07-31 | Discharge: 2019-07-31 | Disposition: A | Discharge status: Home / Self Care | Payer: BC Managed Care – PPO | Source: Ambulatory Visit | Attending: Radiation Oncology | Admitting: Radiation Oncology

## 2019-07-31 ENCOUNTER — Other Ambulatory Visit: Payer: Self-pay

## 2019-07-31 DIAGNOSIS — C20 Malignant neoplasm of rectum: Secondary | ICD-10-CM | POA: Diagnosis not present

## 2019-07-31 DIAGNOSIS — Z510 Encounter for antineoplastic radiation therapy: Secondary | ICD-10-CM | POA: Diagnosis not present

## 2019-08-01 ENCOUNTER — Other Ambulatory Visit: Payer: Self-pay

## 2019-08-01 ENCOUNTER — Ambulatory Visit
Admission: RE | Admit: 2019-08-01 | Discharge: 2019-08-01 | Disposition: A | Discharge status: Home / Self Care | Payer: BC Managed Care – PPO | Source: Ambulatory Visit | Attending: Radiation Oncology | Admitting: Radiation Oncology

## 2019-08-01 DIAGNOSIS — C20 Malignant neoplasm of rectum: Secondary | ICD-10-CM | POA: Diagnosis not present

## 2019-08-01 DIAGNOSIS — Z510 Encounter for antineoplastic radiation therapy: Secondary | ICD-10-CM | POA: Diagnosis not present

## 2019-08-02 ENCOUNTER — Other Ambulatory Visit: Payer: Self-pay

## 2019-08-02 ENCOUNTER — Ambulatory Visit
Admission: RE | Admit: 2019-08-02 | Discharge: 2019-08-02 | Disposition: A | Discharge status: Home / Self Care | Payer: BC Managed Care – PPO | Source: Ambulatory Visit | Attending: Radiation Oncology | Admitting: Radiation Oncology

## 2019-08-02 DIAGNOSIS — Z510 Encounter for antineoplastic radiation therapy: Secondary | ICD-10-CM | POA: Diagnosis not present

## 2019-08-02 DIAGNOSIS — C20 Malignant neoplasm of rectum: Secondary | ICD-10-CM | POA: Diagnosis not present

## 2019-08-06 ENCOUNTER — Ambulatory Visit
Admission: RE | Admit: 2019-08-06 | Discharge: 2019-08-06 | Disposition: A | Discharge status: Home / Self Care | Payer: BC Managed Care – PPO | Source: Ambulatory Visit | Attending: Radiation Oncology | Admitting: Radiation Oncology

## 2019-08-06 ENCOUNTER — Other Ambulatory Visit: Payer: Self-pay

## 2019-08-06 DIAGNOSIS — Z510 Encounter for antineoplastic radiation therapy: Secondary | ICD-10-CM | POA: Diagnosis not present

## 2019-08-06 DIAGNOSIS — C20 Malignant neoplasm of rectum: Secondary | ICD-10-CM | POA: Diagnosis not present

## 2019-08-07 ENCOUNTER — Other Ambulatory Visit: Payer: Self-pay

## 2019-08-07 ENCOUNTER — Ambulatory Visit
Admission: RE | Admit: 2019-08-07 | Discharge: 2019-08-07 | Disposition: A | Discharge status: Home / Self Care | Payer: BC Managed Care – PPO | Source: Ambulatory Visit | Attending: Radiation Oncology | Admitting: Radiation Oncology

## 2019-08-07 DIAGNOSIS — C20 Malignant neoplasm of rectum: Secondary | ICD-10-CM | POA: Diagnosis not present

## 2019-08-07 DIAGNOSIS — Z510 Encounter for antineoplastic radiation therapy: Secondary | ICD-10-CM | POA: Diagnosis not present

## 2019-08-08 ENCOUNTER — Ambulatory Visit
Admission: RE | Admit: 2019-08-08 | Discharge: 2019-08-08 | Disposition: A | Discharge status: Home / Self Care | Payer: BC Managed Care – PPO | Source: Ambulatory Visit | Attending: Radiation Oncology | Admitting: Radiation Oncology

## 2019-08-08 ENCOUNTER — Other Ambulatory Visit: Payer: Self-pay

## 2019-08-08 DIAGNOSIS — Z510 Encounter for antineoplastic radiation therapy: Secondary | ICD-10-CM | POA: Diagnosis not present

## 2019-08-08 DIAGNOSIS — C20 Malignant neoplasm of rectum: Secondary | ICD-10-CM | POA: Diagnosis not present

## 2019-08-09 ENCOUNTER — Inpatient Hospital Stay: Payer: BC Managed Care – PPO

## 2019-08-09 ENCOUNTER — Inpatient Hospital Stay (HOSPITAL_BASED_OUTPATIENT_CLINIC_OR_DEPARTMENT_OTHER): Payer: BC Managed Care – PPO | Admitting: Oncology

## 2019-08-09 ENCOUNTER — Ambulatory Visit
Admission: RE | Admit: 2019-08-09 | Discharge: 2019-08-09 | Disposition: A | Discharge status: Home / Self Care | Payer: BC Managed Care – PPO | Source: Ambulatory Visit | Attending: Radiation Oncology | Admitting: Radiation Oncology

## 2019-08-09 ENCOUNTER — Other Ambulatory Visit: Payer: Self-pay

## 2019-08-09 VITALS — BP 126/77 | HR 81 | Temp 98.3°F | Resp 18 | Ht 69.0 in | Wt 191.3 lb

## 2019-08-09 DIAGNOSIS — G629 Polyneuropathy, unspecified: Secondary | ICD-10-CM | POA: Diagnosis not present

## 2019-08-09 DIAGNOSIS — Z510 Encounter for antineoplastic radiation therapy: Secondary | ICD-10-CM | POA: Diagnosis not present

## 2019-08-09 DIAGNOSIS — R634 Abnormal weight loss: Secondary | ICD-10-CM | POA: Diagnosis not present

## 2019-08-09 DIAGNOSIS — Z95828 Presence of other vascular implants and grafts: Secondary | ICD-10-CM

## 2019-08-09 DIAGNOSIS — C20 Malignant neoplasm of rectum: Secondary | ICD-10-CM

## 2019-08-09 DIAGNOSIS — Z9221 Personal history of antineoplastic chemotherapy: Secondary | ICD-10-CM | POA: Diagnosis not present

## 2019-08-09 LAB — CMP (CANCER CENTER ONLY)
ALT: 98 U/L — ABNORMAL HIGH (ref 0–44)
AST: 59 U/L — ABNORMAL HIGH (ref 15–41)
Albumin: 3.7 g/dL (ref 3.5–5.0)
Alkaline Phosphatase: 186 U/L — ABNORMAL HIGH (ref 38–126)
Anion gap: 10 (ref 5–15)
BUN: 12 mg/dL (ref 6–20)
CO2: 25 mmol/L (ref 22–32)
Calcium: 9.1 mg/dL (ref 8.9–10.3)
Chloride: 106 mmol/L (ref 98–111)
Creatinine: 0.78 mg/dL (ref 0.61–1.24)
GFR, Est AFR Am: >60 mL/min (ref >60)
GFR, Estimated: >60 mL/min (ref >60)
Glucose, Bld: 145 mg/dL — ABNORMAL HIGH (ref 70–99)
Potassium: 4.3 mmol/L (ref 3.5–5.1)
Sodium: 141 mmol/L (ref 135–145)
Total Bilirubin: 0.3 mg/dL (ref 0.3–1.2)
Total Protein: 7 g/dL (ref 6.5–8.1)

## 2019-08-09 LAB — CBC WITH DIFFERENTIAL (CANCER CENTER ONLY)
Abs Immature Granulocytes: 0 10*3/uL (ref 0.00–0.07)
Basophils Absolute: 0 10*3/uL (ref 0.0–0.1)
Basophils Relative: 1 %
Eosinophils Absolute: 0 10*3/uL (ref 0.0–0.5)
Eosinophils Relative: 1 %
HCT: 37.3 % — ABNORMAL LOW (ref 39.0–52.0)
Hemoglobin: 13 g/dL (ref 13.0–17.0)
Immature Granulocytes: 0 %
Lymphocytes Relative: 16 %
Lymphs Abs: 0.5 10*3/uL — ABNORMAL LOW (ref 0.7–4.0)
MCH: 32.7 pg (ref 26.0–34.0)
MCHC: 34.9 g/dL (ref 30.0–36.0)
MCV: 93.7 fL (ref 80.0–100.0)
Monocytes Absolute: 0.3 10*3/uL (ref 0.1–1.0)
Monocytes Relative: 12 %
Neutro Abs: 2.1 10*3/uL (ref 1.7–7.7)
Neutrophils Relative %: 70 %
Platelet Count: 134 10*3/uL — ABNORMAL LOW (ref 150–400)
RBC: 3.98 MIL/uL — ABNORMAL LOW (ref 4.22–5.81)
RDW: 13.5 % (ref 11.5–15.5)
WBC Count: 2.9 10*3/uL — ABNORMAL LOW (ref 4.0–10.5)
nRBC: 0 % (ref 0.0–0.2)

## 2019-08-09 MED ORDER — HEPARIN SOD (PORK) LOCK FLUSH 100 UNIT/ML IV SOLN
500.0000 [IU] | Freq: Once | INTRAVENOUS | Status: AC | PRN
  Administered 2019-08-09: 500 [IU]
  Filled 2019-08-09: qty 5

## 2019-08-09 MED ORDER — SODIUM CHLORIDE 0.9% FLUSH
10.0000 mL | INTRAVENOUS | Status: DC | PRN
  Administered 2019-08-09: 10 mL
  Filled 2019-08-09: qty 10

## 2019-08-09 NOTE — Progress Notes (Signed)
Jeffrey Day   Diagnosis: Rectal cancer  INTERVAL HISTORY:   Jeffrey Day returns as scheduled.  He began concurrent Xeloda and radiation on 07/23/2019.  No nausea, mouth sores, diarrhea, or hand/foot pain.  No neuropathy symptoms.  He has rectal urgency and mild rectal pain.  He continues working.  No malaise.  Objective:  Vital signs in last 24 hours:  Blood pressure 126/77, pulse 81, temperature 98.3 F (36.8 C), temperature source Temporal, resp. rate 18, height 5' 9"  (1.753 m), weight 191 lb 4.8 oz (86.8 kg), SpO2 99 %.    Limited physical examination secondary to distancing with the Covid pandemic GI: No hepatomegaly, soft, nontender Vascular: No leg edema  Skin: Palms without erythema, mild erythema at the soles  Portacath/PICC-without erythema  Lab Results:  Lab Results  Component Value Date   WBC 2.9 (L) 08/09/2019   HGB 13.0 08/09/2019   HCT 37.3 (L) 08/09/2019   MCV 93.7 08/09/2019   PLT 134 (L) 08/09/2019   NEUTROABS 2.1 08/09/2019    CMP  Lab Results  Component Value Date   NA 141 06/28/2019   K 4.5 06/28/2019   CL 106 06/28/2019   CO2 23 06/28/2019   GLUCOSE 122 (H) 06/28/2019   BUN 14 06/28/2019   CREATININE 0.77 06/28/2019   CALCIUM 9.4 06/28/2019   PROT 7.3 06/28/2019   ALBUMIN 4.0 06/28/2019   AST 81 (H) 06/28/2019   ALT 100 (H) 06/28/2019   ALKPHOS 216 (H) 06/28/2019   BILITOT 0.5 06/28/2019   GFRNONAA >60 06/28/2019   GFRAA >60 06/28/2019    Lab Results  Component Value Date   CEA1 4.01 04/25/2019    Medications: I have reviewed the patient's current medications.   Assessment/Plan: 1. Rectal cancer  Colonoscopy 02/15/2019-frond-like/villous, fungating, infiltrative and ulcerated partially obstructing large mass extending from the dentate line into the distal and mid rectum. Mass partially circumferential. Mass measured 9 cm in length. Biopsies showed invasive adenocarcinoma; preserved  expression of the major and minor MMR proteins.   Upper endoscopy 02/15/2019-distal esophagitis, esophageal mucosal changes suspicious for long segment Barrett's esophagus. Biopsy of the duodenum showed peptic duodenitis with no dysplasia or malignancy; random gastric biopsy showed mild chronic gastritis and no intestinal metaplasia dysplasia or malignancy. Distal esophagus biopsy showed intestinal metaplasia consistent with Barrett's esophagus and no dysplasia or malignancy.  CTs 02/22/2019-wall thickening of the rectum. A few subcentimeter perirectal lymph nodes. Small bowel to small bowel intussusception within the jejunum which appears to partially resolve on delayed imaging. 2 mm nonspecific pulmonary nodule superior segment left lower lobe.  03/06/2019 lower endoscopic ultrasound-flex impression-malignant partially obstructing tumor in the proximal rectum and in the mid rectum, internal hemorrhoids. EUS impression-rectal mass visualized endosonography, staged T3N1. 1 malignant appearing lymph node was visualized in a peritumoral location. Endosonographic appearance suspicious for metastatic colorectal adenocarcinoma. Internal anal sphincter was visualized and appeared normal. No sign of significant pathology at the rectosigmoid junction and in the sigmoid colon. Prostate noted with calcification within it.  Cycle 1 FOLFOX 03/15/2019  Cycle 2 FOLFOX 03/28/2019  Cycle 3 FOLFOX 04/12/2019  Cycle 4 FOLFOX 04/25/2019  Cycle 5 FOLFOX 05/17/2019, Udenyca added  Cycle 6 FOLFOX 05/31/2019, Udenyca  Cycle 7 FOLFOX 06/14/2019, Udenyca held  Cycle 8 FOLFOX 06/28/2019  Radiation/Xeloda 07/23/2019 2. Change in bowel habits secondary to#08 Dec 2018 3. Rectal bleeding secondary to#08 Dec 2018 4. Weight loss 5. Port-A-Cath placement, Dr. Marcello Day, 03/08/2019     Disposition: [name] is  tolerating the Xeloda and radiation well.  He will continue the current treatment.  He will return for  an office and lab visit during the last week of treatment.  He is scheduled to see Dr. Marcello Day at the completion of radiation.  He will call for new symptoms.  Jeffrey Coder, MD  08/09/2019  8:41 AM

## 2019-08-13 ENCOUNTER — Other Ambulatory Visit: Payer: Self-pay

## 2019-08-13 ENCOUNTER — Ambulatory Visit
Admission: RE | Admit: 2019-08-13 | Discharge: 2019-08-13 | Disposition: A | Discharge status: Home / Self Care | Payer: BC Managed Care – PPO | Source: Ambulatory Visit | Attending: Radiation Oncology | Admitting: Radiation Oncology

## 2019-08-13 DIAGNOSIS — Z510 Encounter for antineoplastic radiation therapy: Secondary | ICD-10-CM | POA: Diagnosis not present

## 2019-08-13 DIAGNOSIS — C20 Malignant neoplasm of rectum: Secondary | ICD-10-CM | POA: Diagnosis not present

## 2019-08-14 ENCOUNTER — Ambulatory Visit
Admission: RE | Admit: 2019-08-14 | Discharge: 2019-08-14 | Disposition: A | Discharge status: Home / Self Care | Payer: BC Managed Care – PPO | Source: Ambulatory Visit | Attending: Radiation Oncology | Admitting: Radiation Oncology

## 2019-08-14 ENCOUNTER — Other Ambulatory Visit: Payer: Self-pay

## 2019-08-14 DIAGNOSIS — C20 Malignant neoplasm of rectum: Secondary | ICD-10-CM | POA: Diagnosis not present

## 2019-08-14 DIAGNOSIS — Z510 Encounter for antineoplastic radiation therapy: Secondary | ICD-10-CM | POA: Diagnosis not present

## 2019-08-14 MED FILL — CAPECITABINE 500 MG TABS: 500 | 11 days supply | Qty: 48 | Fill #1

## 2019-08-15 ENCOUNTER — Ambulatory Visit
Admission: RE | Admit: 2019-08-15 | Discharge: 2019-08-15 | Disposition: A | Discharge status: Home / Self Care | Payer: BC Managed Care – PPO | Source: Ambulatory Visit | Attending: Radiation Oncology | Admitting: Radiation Oncology

## 2019-08-15 ENCOUNTER — Other Ambulatory Visit: Payer: Self-pay

## 2019-08-15 DIAGNOSIS — Z510 Encounter for antineoplastic radiation therapy: Secondary | ICD-10-CM | POA: Diagnosis not present

## 2019-08-15 DIAGNOSIS — C20 Malignant neoplasm of rectum: Secondary | ICD-10-CM | POA: Diagnosis not present

## 2019-08-16 ENCOUNTER — Ambulatory Visit
Admission: RE | Admit: 2019-08-16 | Discharge: 2019-08-16 | Disposition: A | Discharge status: Home / Self Care | Payer: BC Managed Care – PPO | Source: Ambulatory Visit | Attending: Radiation Oncology | Admitting: Radiation Oncology

## 2019-08-16 ENCOUNTER — Other Ambulatory Visit: Payer: Self-pay

## 2019-08-16 DIAGNOSIS — Z510 Encounter for antineoplastic radiation therapy: Secondary | ICD-10-CM | POA: Diagnosis not present

## 2019-08-16 DIAGNOSIS — C20 Malignant neoplasm of rectum: Secondary | ICD-10-CM | POA: Diagnosis not present

## 2019-08-17 ENCOUNTER — Other Ambulatory Visit: Payer: Self-pay

## 2019-08-17 ENCOUNTER — Ambulatory Visit
Admission: RE | Admit: 2019-08-17 | Discharge: 2019-08-17 | Disposition: A | Discharge status: Home / Self Care | Payer: BC Managed Care – PPO | Source: Ambulatory Visit | Attending: Radiation Oncology | Admitting: Radiation Oncology

## 2019-08-17 DIAGNOSIS — C20 Malignant neoplasm of rectum: Secondary | ICD-10-CM | POA: Diagnosis not present

## 2019-08-17 DIAGNOSIS — Z510 Encounter for antineoplastic radiation therapy: Secondary | ICD-10-CM | POA: Diagnosis not present

## 2019-08-20 ENCOUNTER — Ambulatory Visit
Admission: RE | Admit: 2019-08-20 | Discharge: 2019-08-20 | Disposition: A | Discharge status: Home / Self Care | Payer: BC Managed Care – PPO | Source: Ambulatory Visit | Attending: Radiation Oncology | Admitting: Radiation Oncology

## 2019-08-20 ENCOUNTER — Other Ambulatory Visit: Payer: Self-pay

## 2019-08-20 DIAGNOSIS — Z510 Encounter for antineoplastic radiation therapy: Secondary | ICD-10-CM | POA: Diagnosis not present

## 2019-08-20 DIAGNOSIS — C20 Malignant neoplasm of rectum: Secondary | ICD-10-CM | POA: Diagnosis not present

## 2019-08-21 ENCOUNTER — Other Ambulatory Visit: Payer: Self-pay

## 2019-08-21 ENCOUNTER — Ambulatory Visit
Admission: RE | Admit: 2019-08-21 | Discharge: 2019-08-21 | Disposition: A | Discharge status: Home / Self Care | Payer: BC Managed Care – PPO | Source: Ambulatory Visit | Attending: Radiation Oncology | Admitting: Radiation Oncology

## 2019-08-21 DIAGNOSIS — C20 Malignant neoplasm of rectum: Secondary | ICD-10-CM | POA: Diagnosis not present

## 2019-08-21 DIAGNOSIS — Z510 Encounter for antineoplastic radiation therapy: Secondary | ICD-10-CM | POA: Diagnosis not present

## 2019-08-22 ENCOUNTER — Ambulatory Visit
Admission: RE | Admit: 2019-08-22 | Discharge: 2019-08-22 | Disposition: A | Discharge status: Home / Self Care | Payer: BC Managed Care – PPO | Source: Ambulatory Visit | Attending: Radiation Oncology | Admitting: Radiation Oncology

## 2019-08-22 ENCOUNTER — Other Ambulatory Visit: Payer: Self-pay

## 2019-08-22 DIAGNOSIS — Z510 Encounter for antineoplastic radiation therapy: Secondary | ICD-10-CM | POA: Diagnosis not present

## 2019-08-22 DIAGNOSIS — C20 Malignant neoplasm of rectum: Secondary | ICD-10-CM | POA: Diagnosis not present

## 2019-08-23 ENCOUNTER — Other Ambulatory Visit: Payer: Self-pay

## 2019-08-23 ENCOUNTER — Ambulatory Visit
Admission: RE | Admit: 2019-08-23 | Discharge: 2019-08-23 | Disposition: A | Discharge status: Home / Self Care | Payer: BC Managed Care – PPO | Source: Ambulatory Visit | Attending: Radiation Oncology | Admitting: Radiation Oncology

## 2019-08-23 DIAGNOSIS — Z510 Encounter for antineoplastic radiation therapy: Secondary | ICD-10-CM | POA: Diagnosis not present

## 2019-08-23 DIAGNOSIS — C20 Malignant neoplasm of rectum: Secondary | ICD-10-CM | POA: Diagnosis not present

## 2019-08-24 ENCOUNTER — Other Ambulatory Visit: Payer: Self-pay

## 2019-08-24 ENCOUNTER — Ambulatory Visit
Admission: RE | Admit: 2019-08-24 | Discharge: 2019-08-24 | Disposition: A | Discharge status: Home / Self Care | Payer: BC Managed Care – PPO | Source: Ambulatory Visit | Attending: Radiation Oncology | Admitting: Radiation Oncology

## 2019-08-24 DIAGNOSIS — C20 Malignant neoplasm of rectum: Secondary | ICD-10-CM | POA: Diagnosis not present

## 2019-08-24 DIAGNOSIS — Z510 Encounter for antineoplastic radiation therapy: Secondary | ICD-10-CM | POA: Diagnosis not present

## 2019-08-27 ENCOUNTER — Ambulatory Visit
Admission: RE | Admit: 2019-08-27 | Discharge: 2019-08-27 | Disposition: A | Discharge status: Home / Self Care | Payer: BC Managed Care – PPO | Source: Ambulatory Visit | Attending: Radiation Oncology | Admitting: Radiation Oncology

## 2019-08-27 ENCOUNTER — Other Ambulatory Visit: Payer: Self-pay

## 2019-08-27 DIAGNOSIS — Z510 Encounter for antineoplastic radiation therapy: Secondary | ICD-10-CM | POA: Diagnosis not present

## 2019-08-27 DIAGNOSIS — C20 Malignant neoplasm of rectum: Secondary | ICD-10-CM | POA: Diagnosis not present

## 2019-08-28 ENCOUNTER — Ambulatory Visit
Admission: RE | Admit: 2019-08-28 | Discharge: 2019-08-28 | Disposition: A | Discharge status: Home / Self Care | Payer: BC Managed Care – PPO | Source: Ambulatory Visit | Attending: Radiation Oncology | Admitting: Radiation Oncology

## 2019-08-28 ENCOUNTER — Encounter: Payer: Self-pay | Admitting: Nurse Practitioner

## 2019-08-28 ENCOUNTER — Inpatient Hospital Stay: Payer: BC Managed Care – PPO

## 2019-08-28 ENCOUNTER — Other Ambulatory Visit: Payer: Self-pay

## 2019-08-28 ENCOUNTER — Inpatient Hospital Stay: Payer: BC Managed Care – PPO | Attending: Oncology | Admitting: Nurse Practitioner

## 2019-08-28 VITALS — BP 129/95 | HR 87 | Temp 97.8°F | Resp 17 | Ht 69.0 in | Wt 192.0 lb

## 2019-08-28 DIAGNOSIS — K5900 Constipation, unspecified: Secondary | ICD-10-CM | POA: Diagnosis not present

## 2019-08-28 DIAGNOSIS — Z510 Encounter for antineoplastic radiation therapy: Secondary | ICD-10-CM | POA: Diagnosis not present

## 2019-08-28 DIAGNOSIS — C20 Malignant neoplasm of rectum: Secondary | ICD-10-CM | POA: Diagnosis not present

## 2019-08-28 DIAGNOSIS — R197 Diarrhea, unspecified: Secondary | ICD-10-CM | POA: Diagnosis not present

## 2019-08-28 DIAGNOSIS — R634 Abnormal weight loss: Secondary | ICD-10-CM | POA: Diagnosis not present

## 2019-08-28 DIAGNOSIS — K2980 Duodenitis without bleeding: Secondary | ICD-10-CM | POA: Insufficient documentation

## 2019-08-28 DIAGNOSIS — Z923 Personal history of irradiation: Secondary | ICD-10-CM | POA: Diagnosis not present

## 2019-08-28 DIAGNOSIS — R200 Anesthesia of skin: Secondary | ICD-10-CM | POA: Diagnosis not present

## 2019-08-28 DIAGNOSIS — Z95828 Presence of other vascular implants and grafts: Secondary | ICD-10-CM

## 2019-08-28 DIAGNOSIS — K625 Hemorrhage of anus and rectum: Secondary | ICD-10-CM | POA: Insufficient documentation

## 2019-08-28 LAB — CMP (CANCER CENTER ONLY)
ALT: 43 U/L (ref 0–44)
AST: 37 U/L (ref 15–41)
Albumin: 4.1 g/dL (ref 3.5–5.0)
Alkaline Phosphatase: 133 U/L — ABNORMAL HIGH (ref 38–126)
Anion gap: 9 (ref 5–15)
BUN: 13 mg/dL (ref 6–20)
CO2: 24 mmol/L (ref 22–32)
Calcium: 9.4 mg/dL (ref 8.9–10.3)
Chloride: 108 mmol/L (ref 98–111)
Creatinine: 0.85 mg/dL (ref 0.61–1.24)
GFR, Est AFR Am: >60 mL/min (ref >60)
GFR, Estimated: >60 mL/min (ref >60)
Glucose, Bld: 101 mg/dL — ABNORMAL HIGH (ref 70–99)
Potassium: 4.9 mmol/L (ref 3.5–5.1)
Sodium: 141 mmol/L (ref 135–145)
Total Bilirubin: 0.3 mg/dL (ref 0.3–1.2)
Total Protein: 7.5 g/dL (ref 6.5–8.1)

## 2019-08-28 LAB — CBC WITH DIFFERENTIAL (CANCER CENTER ONLY)
Abs Immature Granulocytes: 0.03 10*3/uL (ref 0.00–0.07)
Basophils Absolute: 0 10*3/uL (ref 0.0–0.1)
Basophils Relative: 1 %
Eosinophils Absolute: 0 10*3/uL (ref 0.0–0.5)
Eosinophils Relative: 1 %
HCT: 39.1 % (ref 39.0–52.0)
Hemoglobin: 13.7 g/dL (ref 13.0–17.0)
Immature Granulocytes: 1 %
Lymphocytes Relative: 8 %
Lymphs Abs: 0.3 10*3/uL — ABNORMAL LOW (ref 0.7–4.0)
MCH: 33.9 pg (ref 26.0–34.0)
MCHC: 35 g/dL (ref 30.0–36.0)
MCV: 96.8 fL (ref 80.0–100.0)
Monocytes Absolute: 0.6 10*3/uL (ref 0.1–1.0)
Monocytes Relative: 14 %
Neutro Abs: 3.1 10*3/uL (ref 1.7–7.7)
Neutrophils Relative %: 75 %
Platelet Count: 197 10*3/uL (ref 150–400)
RBC: 4.04 MIL/uL — ABNORMAL LOW (ref 4.22–5.81)
RDW: 14.8 % (ref 11.5–15.5)
WBC Count: 4.1 10*3/uL (ref 4.0–10.5)
nRBC: 0 % (ref 0.0–0.2)

## 2019-08-28 MED ORDER — HEPARIN SOD (PORK) LOCK FLUSH 100 UNIT/ML IV SOLN
500.0000 [IU] | Freq: Once | INTRAVENOUS | Status: AC | PRN
  Administered 2019-08-28: 09:00:00 500 [IU]
  Filled 2019-08-28: qty 5

## 2019-08-28 MED ORDER — SODIUM CHLORIDE 0.9% FLUSH
10.0000 mL | INTRAVENOUS | Status: DC | PRN
  Administered 2019-08-28: 10 mL
  Filled 2019-08-28: qty 10

## 2019-08-28 NOTE — Patient Instructions (Signed)

## 2019-08-28 NOTE — Progress Notes (Addendum)
[place] OFFICE PROGRESS NOTE   Diagnosis:  Rectal cancer  INTERVAL HISTORY:   [name] returns as scheduled.  He continues radiation and Xeloda.  He denies nausea/vomiting.  No mouth sores.  He has occasional diarrhea and occasional constipation.  No bleeding or pain with bowel movements.  No hand or foot pain or redness.  He recently noticed numbness involving the "big toes".  This does not interfere with activity.  Objective:  Vital signs in last 24 hours:  Blood pressure (!) 129/95, pulse 87, temperature 97.8 F (36.6 C), temperature source Temporal, resp. rate 17, height _0  (1.753 m), weight 192 lb (87.1 kg), SpO2 100 %.    HEENT: No thrush or ulcers. GI: Abdomen soft and nontender.  No hepatomegaly. Vascular: No leg edema.  Skin: Palms with mild erythema. Port-A-Cath without erythema.   Lab Results:  Lab Results  Component Value Date   WBC 4.1 08/28/2019   HGB 13.7 08/28/2019   HCT 39.1 08/28/2019   MCV 96.8 08/28/2019   PLT 197 08/28/2019   NEUTROABS 3.1 08/28/2019    Imaging:  No results found.  Medications: I have reviewed the patient's current medications.  Assessment/Plan: 1. Rectal cancer  Colonoscopy 02/15/2019-frond-like/villous, fungating, infiltrative and ulcerated partially obstructing large mass extending from the dentate line into the distal and mid rectum. Mass partially circumferential. Mass measured 9 cm in length. Biopsies showed invasive adenocarcinoma; preserved expression of the major and minor MMR proteins.   Upper endoscopy 02/15/2019-distal esophagitis, esophageal mucosal changes suspicious for long segment Barrett's esophagus. Biopsy of the duodenum showed peptic duodenitis with no dysplasia or malignancy; random gastric biopsy showed mild chronic gastritis and no intestinal metaplasia dysplasia or malignancy. Distal esophagus biopsy showed intestinal metaplasia consistent with Barrett's esophagus and no  dysplasia or malignancy.  CTs 02/22/2019-wall thickening of the rectum. A few subcentimeter perirectal lymph nodes. Small bowel to small bowel intussusception within the jejunum which appears to partially resolve on delayed imaging. 2 mm nonspecific pulmonary nodule superior segment left lower lobe.  03/06/2019 lower endoscopic ultrasound-flex impression-malignant partially obstructing tumor in the proximal rectum and in the mid rectum, internal hemorrhoids. EUS impression-rectal mass visualized endosonography, staged T3N1. 1 malignant appearing lymph node was visualized in a peritumoral location. Endosonographic appearance suspicious for metastatic colorectal adenocarcinoma. Internal anal sphincter was visualized and appeared normal. No sign of significant pathology at the rectosigmoid junction and in the sigmoid colon. Prostate noted with calcification within it.  Cycle 1 FOLFOX 03/15/2019  Cycle 2 FOLFOX 03/28/2019  Cycle 3 FOLFOX 04/12/2019  Cycle 4 FOLFOX 04/25/2019  Cycle 5 FOLFOX 05/17/2019, Udenyca added  Cycle 6 FOLFOX 05/31/2019, Udenyca  Cycle 7 FOLFOX 06/14/2019, Udenyca held  Cycle 8 FOLFOX 06/28/2019  Radiation/Xeloda 07/23/2019 2. Change in bowel habits secondary to#08 Dec 2018 3. Rectal bleeding secondary to#08 Dec 2018 4. Weight loss 5. Port-A-Cath placement, Jeffrey Day, 03/08/2019   Disposition: [name] appears stable.  He continues to tolerate treatment well.  He will complete the course of radiation/Xeloda on 08/31/2019.  He has an appointment with Jeffrey Day next week.  We reviewed the CBC and chemistry panel from today.  Labs are adequate to continue with treatment as above.  He will return for a Port-A-Cath flush and follow-up visit in 1 month.  He will contact the office in the interim with any problems.  Patient seen with Jeffrey Day.  Jeffrey Day ANP/GNP-BC   08/28/2019  9:10 AM  This was a shared visit with Jeffrey Day  Jeffrey Day.  Jeffrey Day will  complete the total neoadjuvant therapy later this week.  He requests a referral to Jeffrey Day at [facility] to discuss surgical options.  Jeffrey Manson, MD

## 2019-08-29 ENCOUNTER — Ambulatory Visit
Admission: RE | Admit: 2019-08-29 | Discharge: 2019-08-29 | Disposition: A | Discharge status: Home / Self Care | Payer: BC Managed Care – PPO | Source: Ambulatory Visit | Attending: Radiation Oncology | Admitting: Radiation Oncology

## 2019-08-29 ENCOUNTER — Telehealth: Payer: Self-pay | Admitting: Oncology

## 2019-08-29 ENCOUNTER — Other Ambulatory Visit: Payer: Self-pay

## 2019-08-29 DIAGNOSIS — C20 Malignant neoplasm of rectum: Secondary | ICD-10-CM | POA: Diagnosis not present

## 2019-08-29 DIAGNOSIS — Z510 Encounter for antineoplastic radiation therapy: Secondary | ICD-10-CM | POA: Diagnosis not present

## 2019-08-29 NOTE — Telephone Encounter (Signed)
Scheduled per 1/19 los. Called and left msg. Mailing printout

## 2019-08-29 NOTE — Telephone Encounter (Signed)
FAXED RECORDS TO DR WATERS AT [facility] [phone]

## 2019-08-30 ENCOUNTER — Other Ambulatory Visit: Payer: Self-pay

## 2019-08-30 ENCOUNTER — Ambulatory Visit
Admission: RE | Admit: 2019-08-30 | Discharge: 2019-08-30 | Disposition: A | Discharge status: Home / Self Care | Payer: BC Managed Care – PPO | Source: Ambulatory Visit | Attending: Radiation Oncology | Admitting: Radiation Oncology

## 2019-08-30 DIAGNOSIS — Z510 Encounter for antineoplastic radiation therapy: Secondary | ICD-10-CM | POA: Diagnosis not present

## 2019-08-30 DIAGNOSIS — C20 Malignant neoplasm of rectum: Secondary | ICD-10-CM | POA: Diagnosis not present

## 2019-08-31 ENCOUNTER — Ambulatory Visit
Admission: RE | Admit: 2019-08-31 | Discharge: 2019-08-31 | Disposition: A | Discharge status: Home / Self Care | Payer: BC Managed Care – PPO | Source: Ambulatory Visit | Attending: Radiation Oncology | Admitting: Radiation Oncology

## 2019-08-31 ENCOUNTER — Other Ambulatory Visit: Payer: Self-pay

## 2019-08-31 ENCOUNTER — Encounter: Payer: Self-pay | Admitting: Radiation Oncology

## 2019-08-31 DIAGNOSIS — C20 Malignant neoplasm of rectum: Secondary | ICD-10-CM | POA: Diagnosis not present

## 2019-08-31 DIAGNOSIS — Z510 Encounter for antineoplastic radiation therapy: Secondary | ICD-10-CM | POA: Diagnosis not present

## 2019-09-11 DIAGNOSIS — C20 Malignant neoplasm of rectum: Secondary | ICD-10-CM | POA: Diagnosis not present

## 2019-09-18 DIAGNOSIS — Z95828 Presence of other vascular implants and grafts: Secondary | ICD-10-CM | POA: Diagnosis not present

## 2019-09-18 DIAGNOSIS — R911 Solitary pulmonary nodule: Secondary | ICD-10-CM | POA: Diagnosis not present

## 2019-09-18 DIAGNOSIS — C20 Malignant neoplasm of rectum: Secondary | ICD-10-CM | POA: Diagnosis not present

## 2019-09-18 DIAGNOSIS — X58XXXS Exposure to other specified factors, sequela: Secondary | ICD-10-CM | POA: Diagnosis not present

## 2019-09-18 DIAGNOSIS — T1592XS Foreign body on external eye, part unspecified, left eye, sequela: Secondary | ICD-10-CM | POA: Diagnosis not present

## 2019-09-18 DIAGNOSIS — M4317 Spondylolisthesis, lumbosacral region: Secondary | ICD-10-CM | POA: Diagnosis not present

## 2019-09-18 DIAGNOSIS — T1592XD Foreign body on external eye, part unspecified, left eye, subsequent encounter: Secondary | ICD-10-CM | POA: Diagnosis not present

## 2019-09-25 ENCOUNTER — Telehealth: Payer: Self-pay | Admitting: Radiation Oncology

## 2019-09-25 NOTE — Telephone Encounter (Signed)
  Radiation Oncology         [phone]) [phone] ________________________________  Name: Jeffrey Day. MRN: [id number]  Date of Service: 09/25/2019  DOB: 1976-04-24  Post Treatment Telephone Note  Diagnosis:  Stage IIIB, cT3N1M0 invasive adenocarcinoma of the rectum.  Interval Since Last Radiation:  4 weeks   07/23/2019-08/31/19: The patient received 45 Gy to the rectum in 25 fractions, and he received a 5.4 Gy boost in 3 fractions  Narrative:  The patient was contacted today for routine follow-up. During treatment he did very well with radiotherapy and did not have significant desquamation. He is feeling better overall. He denies any concerns with his bowels at this time. He has met with Dr. Morton Stall at [facility], and is contemplating increased post treatment surveillance over surgery.  Impression/Plan: 1. Stage IIIB, cT3N1M0 invasive adenocarcinoma of the rectum. The patient has been doing well since completion of radiotherapy. We discussed that we would be happy to continue to follow him as needed, but he will also continue to follow up with Dr. Morton Stall at [facility] and Dr. Benay Spice in medical oncology.      Carola Rhine, PAC

## 2019-09-28 ENCOUNTER — Other Ambulatory Visit: Payer: Self-pay

## 2019-09-28 ENCOUNTER — Inpatient Hospital Stay: Payer: BC Managed Care – PPO | Attending: Oncology | Admitting: Oncology

## 2019-09-28 ENCOUNTER — Inpatient Hospital Stay: Payer: BC Managed Care – PPO

## 2019-09-28 VITALS — BP 127/78 | HR 76 | Temp 98.5°F | Resp 17 | Ht 69.0 in | Wt 194.9 lb

## 2019-09-28 DIAGNOSIS — R634 Abnormal weight loss: Secondary | ICD-10-CM | POA: Diagnosis not present

## 2019-09-28 DIAGNOSIS — Z9221 Personal history of antineoplastic chemotherapy: Secondary | ICD-10-CM | POA: Diagnosis not present

## 2019-09-28 DIAGNOSIS — R911 Solitary pulmonary nodule: Secondary | ICD-10-CM | POA: Insufficient documentation

## 2019-09-28 DIAGNOSIS — Z923 Personal history of irradiation: Secondary | ICD-10-CM | POA: Insufficient documentation

## 2019-09-28 DIAGNOSIS — G629 Polyneuropathy, unspecified: Secondary | ICD-10-CM | POA: Diagnosis not present

## 2019-09-28 DIAGNOSIS — C20 Malignant neoplasm of rectum: Secondary | ICD-10-CM | POA: Diagnosis not present

## 2019-09-28 DIAGNOSIS — Z95828 Presence of other vascular implants and grafts: Secondary | ICD-10-CM

## 2019-09-28 MED ORDER — SODIUM CHLORIDE 0.9% FLUSH
10.0000 mL | INTRAVENOUS | Status: DC | PRN
  Administered 2019-09-28: 10 mL
  Filled 2019-09-28: qty 10

## 2019-09-28 MED ORDER — HEPARIN SOD (PORK) LOCK FLUSH 100 UNIT/ML IV SOLN
500.0000 [IU] | Freq: Once | INTRAVENOUS | Status: AC | PRN
  Administered 2019-09-28: 500 [IU]
  Filled 2019-09-28: qty 5

## 2019-09-28 NOTE — Patient Instructions (Signed)

## 2019-09-28 NOTE — Progress Notes (Signed)
[place] OFFICE PROGRESS NOTE   Diagnosis: Rectal cancer  INTERVAL HISTORY:   Jeffrey Day returns as scheduled.  He has completed the course of neoadjuvant therapy.  No rectal pain or bleeding.  He has mild numbness in the fingers and toes.  This does not interfere with activity. He saw Dr. Morton Stall on 09/28/2019.  No mass was appreciated on rectal examination. CTs at [facility] on 09/18/2019 No evidence of metastatic disease.  A "micronodule "was noted in the left lower lobe.  No rectal mass.  Near resolution of subcentimeter presacral nodes.  No enlarged retroperitoneal lymph nodes. A CT of the orbits revealed a punctate metallic density in the medial aspect of the right upper eyelid.  He does not feel a lesion in the right eye.  He is scheduled for an endoscopic rectal exam on 10/02/2019.  He is also scheduled for a pelvic MRI. Objective:  Vital signs in last 24 hours:  Blood pressure 127/78, pulse 76, temperature 98.5 F (36.9 C), temperature source Temporal, resp. rate 17, height 5' 9"  (1.753 m), weight 194 lb 14.4 oz (88.4 kg), SpO2 99 %.    HEENT: 1 mm nodule at the medial aspect of the right upper eyelid  lymphatics: No cervical, supraclavicular axillary, or inguinal nodes GI: No hepatosplenomegaly, no mass, nontender Vascular: No leg edema   Skin: Palms without erythema  Portacath/PICC-without erythema  Lab Results:  Lab Results  Component Value Date   WBC 4.1 08/28/2019   HGB 13.7 08/28/2019   HCT 39.1 08/28/2019   MCV 96.8 08/28/2019   PLT 197 08/28/2019   NEUTROABS 3.1 08/28/2019    CMP  Lab Results  Component Value Date   NA 141 08/28/2019   K 4.9 08/28/2019   CL 108 08/28/2019   CO2 24 08/28/2019   GLUCOSE 101 (H) 08/28/2019   BUN 13 08/28/2019   CREATININE 0.85 08/28/2019   CALCIUM 9.4 08/28/2019   PROT 7.5 08/28/2019   ALBUMIN 4.1 08/28/2019   AST 37 08/28/2019   ALT 43 08/28/2019   ALKPHOS 133 (H) 08/28/2019   BILITOT 0.3  08/28/2019   GFRNONAA >60 08/28/2019   GFRAA >60 08/28/2019    Lab Results  Component Value Date   CEA1 4.01 04/25/2019    No results found for: INR  Imaging:  No results found.  Medications: I have reviewed the patient's current medications.   Assessment/Plan: 1. Rectal cancer  Colonoscopy 02/15/2019-frond-like/villous, fungating, infiltrative and ulcerated partially obstructing large mass extending from the dentate line into the distal and mid rectum. Mass partially circumferential. Mass measured 9 cm in length. Biopsies showed invasive adenocarcinoma; preserved expression of the major and minor MMR proteins.   Upper endoscopy 02/15/2019-distal esophagitis, esophageal mucosal changes suspicious for long segment Barrett's esophagus. Biopsy of the duodenum showed peptic duodenitis with no dysplasia or malignancy; random gastric biopsy showed mild chronic gastritis and no intestinal metaplasia dysplasia or malignancy. Distal esophagus biopsy showed intestinal metaplasia consistent with Barrett's esophagus and no dysplasia or malignancy.  CTs 02/22/2019-wall thickening of the rectum. A few subcentimeter perirectal lymph nodes. Small bowel to small bowel intussusception within the jejunum which appears to partially resolve on delayed imaging. 2 mm nonspecific pulmonary nodule superior segment left lower lobe.  03/06/2019 lower endoscopic ultrasound-flex impression-malignant partially obstructing tumor in the proximal rectum and in the mid rectum, internal hemorrhoids. EUS impression-rectal mass visualized endosonography, staged T3N1. 1 malignant appearing lymph node was visualized in a peritumoral location. Endosonographic appearance suspicious for metastatic  colorectal adenocarcinoma. Internal anal sphincter was visualized and appeared normal. No sign of significant pathology at the rectosigmoid junction and in the sigmoid colon. Prostate noted with calcification within  it.  Cycle 1 FOLFOX 03/15/2019  Cycle 2 FOLFOX 03/28/2019  Cycle 3 FOLFOX 04/12/2019  Cycle 4 FOLFOX 04/25/2019  Cycle 5 FOLFOX 05/17/2019, Udenyca added  Cycle 6 FOLFOX 05/31/2019, Udenyca  Cycle 7 FOLFOX 06/14/2019, Udenyca held  Cycle 8 FOLFOX 06/28/2019  Radiation/Xeloda 07/23/2019-08/30/1998  CTs at [facility] 09/18/2019-no evidence of metastatic disease, no rectal mass, decreased presacral lymph nodes  No rectal mass noted on physical examination 09/11/2019 2. Change in bowel habits secondary to#08 Dec 2018-improved 3. Rectal bleeding secondary to#08 Dec 2018-improved 4. Weight loss 5. Port-A-Cath placement, Dr. Marcello Moores, 03/08/2019     Disposition: Jeffrey Day has completed a total neoadjuvant therapy.  There has been clinical and radiologic improvement.  No rectal mass was noted on physical examination by Dr. Morton Stall.  He is scheduled to undergo a lower endoscopic evaluation and pelvic MRI next week.  Dr. Morton Stall has discussed treatment options with him including resection of the rectum, though the appropriate distal extent of the resection is unclear as Dr. Morton Stall did not examine him at baseline.  Surveillance may be appropriate if the proctoscopy and MRI confirmed a complete response.  Jeffrey Day will see Dr. Morton Stall next week.  He will return for an office visit here on 10/24/2019.  We will repeat the CEA when he returns next month.  Betsy Coder, MD  09/28/2019  12:07 PM

## 2019-09-30 ENCOUNTER — Other Ambulatory Visit: Payer: Self-pay | Admitting: Gastroenterology

## 2019-09-30 DIAGNOSIS — K209 Esophagitis, unspecified without bleeding: Secondary | ICD-10-CM

## 2019-10-01 ENCOUNTER — Telehealth: Payer: Self-pay | Admitting: Oncology

## 2019-10-01 NOTE — Telephone Encounter (Signed)
Scheduled per los. Called and spoke with patient. Confirmed appt 

## 2019-10-02 ENCOUNTER — Encounter: Payer: Self-pay | Admitting: Oncology

## 2019-10-02 DIAGNOSIS — Z9221 Personal history of antineoplastic chemotherapy: Secondary | ICD-10-CM | POA: Diagnosis not present

## 2019-10-02 DIAGNOSIS — Z923 Personal history of irradiation: Secondary | ICD-10-CM | POA: Diagnosis not present

## 2019-10-02 DIAGNOSIS — K6289 Other specified diseases of anus and rectum: Secondary | ICD-10-CM | POA: Diagnosis not present

## 2019-10-02 DIAGNOSIS — K626 Ulcer of anus and rectum: Secondary | ICD-10-CM | POA: Diagnosis not present

## 2019-10-02 DIAGNOSIS — Z1211 Encounter for screening for malignant neoplasm of colon: Secondary | ICD-10-CM | POA: Diagnosis not present

## 2019-10-02 DIAGNOSIS — Z85048 Personal history of other malignant neoplasm of rectum, rectosigmoid junction, and anus: Secondary | ICD-10-CM | POA: Diagnosis not present

## 2019-10-02 DIAGNOSIS — C20 Malignant neoplasm of rectum: Secondary | ICD-10-CM | POA: Diagnosis not present

## 2019-10-08 DIAGNOSIS — C20 Malignant neoplasm of rectum: Secondary | ICD-10-CM | POA: Diagnosis not present

## 2019-10-18 NOTE — Progress Notes (Signed)
  Radiation Oncology         [phone] ________________________________  Name: Jeffrey Day. MRN: [id number]  Date: 08/31/2019  DOB: 02/07/1976  End of Treatment Note  Diagnosis:   Rectal cancer     Indication for treatment:  Curative       Radiation treatment dates:   07/23/19 - 08/31/19  Site/dose:    The patient was treated to the pelvis to a dose of 45 Gy at 1.8 Gy per fraction. This was accomplished using a 4 field 3-D conformal technique. The patient then received a boost to the tumor and adjacent high-risk regions for an additional 5.4 Gy at 1.8 gray per fraction. This was carried out using a coned-down 4 field approach. The patient's total dose was 50.4 Gy. Daily AlignRT was used on a daily basis to insure proper patient positioning and localization of critical targets/ structures. The patient received concurrent chemotherapy during the course of radiation treatment.  Narrative: The patient tolerated radiation treatment relatively well.     Plan: The patient has completed radiation treatment. The patient will return to radiation oncology clinic for routine followup in one month. I advised the patient to call or return sooner if they have any questions or concerns related to their recovery or treatment.   ------------------------------------------------  Jodelle Gross, MD, PhD

## 2019-10-24 ENCOUNTER — Inpatient Hospital Stay: Payer: BC Managed Care – PPO | Attending: Oncology | Admitting: Oncology

## 2019-10-24 ENCOUNTER — Inpatient Hospital Stay: Payer: BC Managed Care – PPO

## 2019-10-24 ENCOUNTER — Other Ambulatory Visit: Payer: Self-pay

## 2019-10-24 VITALS — BP 120/82 | HR 73 | Temp 97.1°F | Resp 18 | Ht 69.0 in | Wt 192.9 lb

## 2019-10-24 DIAGNOSIS — C20 Malignant neoplasm of rectum: Secondary | ICD-10-CM

## 2019-10-24 DIAGNOSIS — Z85048 Personal history of other malignant neoplasm of rectum, rectosigmoid junction, and anus: Secondary | ICD-10-CM | POA: Diagnosis not present

## 2019-10-24 DIAGNOSIS — R911 Solitary pulmonary nodule: Secondary | ICD-10-CM | POA: Insufficient documentation

## 2019-10-24 DIAGNOSIS — Z923 Personal history of irradiation: Secondary | ICD-10-CM | POA: Diagnosis not present

## 2019-10-24 DIAGNOSIS — Z95828 Presence of other vascular implants and grafts: Secondary | ICD-10-CM

## 2019-10-24 DIAGNOSIS — R634 Abnormal weight loss: Secondary | ICD-10-CM | POA: Insufficient documentation

## 2019-10-24 DIAGNOSIS — Z9221 Personal history of antineoplastic chemotherapy: Secondary | ICD-10-CM | POA: Insufficient documentation

## 2019-10-24 LAB — CBC WITH DIFFERENTIAL (CANCER CENTER ONLY)
Abs Immature Granulocytes: 0.01 10*3/uL (ref 0.00–0.07)
Basophils Absolute: 0 10*3/uL (ref 0.0–0.1)
Basophils Relative: 1 %
Eosinophils Absolute: 0 10*3/uL (ref 0.0–0.5)
Eosinophils Relative: 1 %
HCT: 41.9 % (ref 39.0–52.0)
Hemoglobin: 14.3 g/dL (ref 13.0–17.0)
Immature Granulocytes: 0 %
Lymphocytes Relative: 14 %
Lymphs Abs: 0.5 10*3/uL — ABNORMAL LOW (ref 0.7–4.0)
MCH: 32.9 pg (ref 26.0–34.0)
MCHC: 34.1 g/dL (ref 30.0–36.0)
MCV: 96.5 fL (ref 80.0–100.0)
Monocytes Absolute: 0.4 10*3/uL (ref 0.1–1.0)
Monocytes Relative: 11 %
Neutro Abs: 2.5 10*3/uL (ref 1.7–7.7)
Neutrophils Relative %: 73 %
Platelet Count: 183 10*3/uL (ref 150–400)
RBC: 4.34 MIL/uL (ref 4.22–5.81)
RDW: 13 % (ref 11.5–15.5)
WBC Count: 3.4 10*3/uL — ABNORMAL LOW (ref 4.0–10.5)
nRBC: 0 % (ref 0.0–0.2)

## 2019-10-24 LAB — CMP (CANCER CENTER ONLY)
ALT: 30 U/L (ref 0–44)
AST: 29 U/L (ref 15–41)
Albumin: 4.2 g/dL (ref 3.5–5.0)
Alkaline Phosphatase: 96 U/L (ref 38–126)
Anion gap: 10 (ref 5–15)
BUN: 14 mg/dL (ref 6–20)
CO2: 25 mmol/L (ref 22–32)
Calcium: 9.4 mg/dL (ref 8.9–10.3)
Chloride: 104 mmol/L (ref 98–111)
Creatinine: 0.88 mg/dL (ref 0.61–1.24)
GFR, Est AFR Am: >60 mL/min (ref >60)
GFR, Estimated: >60 mL/min (ref >60)
Glucose, Bld: 137 mg/dL — ABNORMAL HIGH (ref 70–99)
Potassium: 4.6 mmol/L (ref 3.5–5.1)
Sodium: 139 mmol/L (ref 135–145)
Total Bilirubin: 0.6 mg/dL (ref 0.3–1.2)
Total Protein: 7.3 g/dL (ref 6.5–8.1)

## 2019-10-24 LAB — CEA (IN HOUSE-CHCC): CEA (CHCC-In House): 2.47 ng/mL (ref 0.00–5.00)

## 2019-10-24 MED ORDER — SODIUM CHLORIDE 0.9% FLUSH
10.0000 mL | INTRAVENOUS | Status: DC | PRN
  Administered 2019-10-24: 10 mL
  Filled 2019-10-24: qty 10

## 2019-10-24 MED ORDER — HEPARIN SOD (PORK) LOCK FLUSH 100 UNIT/ML IV SOLN
500.0000 [IU] | Freq: Once | INTRAVENOUS | Status: AC | PRN
  Administered 2019-10-24: 500 [IU]
  Filled 2019-10-24: qty 5

## 2019-10-24 NOTE — Progress Notes (Addendum)
[place] OFFICE PROGRESS NOTE   Diagnosis: Rectal cancer  INTERVAL HISTORY:   Jeffrey Day returns as scheduled.  He underwent a pelvic MRI at [facility] on 10/08/2019.  Postradiation fibrotic changes were noted at the right lateral and posterior lower rectum.  No rectal mass.  Nonspecific 4 mm right mesorectal lymph node and a 6 mm left external iliac node were seen.  He underwent a sigmoidoscopy on 10/02/2019 and radiation changes were seen in the distal rectum.  No mass.  Minimal ulcer and nodularity at the anorectal ring biopsied. the pathology from a biopsy revealed colonic and squamous mucosa with inflammation.  No dysplasia or malignancy.    He is waiting to hear from Dr. Morton Stall regarding the plan for surgery versus surveillance.  Objective:  Vital signs in last 24 hours:  Blood pressure 120/82, pulse 73, temperature (!) 97.1 F (36.2 C), temperature source Temporal, resp. rate 18, height _0  (1.753 m), weight 192 lb 14.4 oz (87.5 kg), SpO2 100 %.     lymphatics: No cervical, supraclavicular, or inguinal nodes GI: No hepatosplenomegaly, no mass, nontender Vascular: No leg edema   Skin: Palms without erythema  Portacath/PICC-without erythema  Lab Results:  Lab Results  Component Value Date   WBC 3.4 (L) 10/24/2019   HGB 14.3 10/24/2019   HCT 41.9 10/24/2019   MCV 96.5 10/24/2019   PLT 183 10/24/2019   NEUTROABS 2.5 10/24/2019    CMP  Lab Results  Component Value Date   NA 141 08/28/2019   K 4.9 08/28/2019   CL 108 08/28/2019   CO2 24 08/28/2019   GLUCOSE 101 (H) 08/28/2019   BUN 13 08/28/2019   CREATININE 0.85 08/28/2019   CALCIUM 9.4 08/28/2019   PROT 7.5 08/28/2019   ALBUMIN 4.1 08/28/2019   AST 37 08/28/2019   ALT 43 08/28/2019   ALKPHOS 133 (H) 08/28/2019   BILITOT 0.3 08/28/2019   GFRNONAA >60 08/28/2019   GFRAA >60 08/28/2019    Lab Results  Component Value Date   CEA1 4.01 04/25/2019     Medications: I have  reviewed the patient's current medications.   Assessment/Plan: 1. Rectal cancer  Colonoscopy 02/15/2019-frond-like/villous, fungating, infiltrative and ulcerated partially obstructing large mass extending from the dentate line into the distal and mid rectum. Mass partially circumferential. Mass measured 9 cm in length. Biopsies showed invasive adenocarcinoma; preserved expression of the major and minor MMR proteins.   Upper endoscopy 02/15/2019-distal esophagitis, esophageal mucosal changes suspicious for long segment Barrett's esophagus. Biopsy of the duodenum showed peptic duodenitis with no dysplasia or malignancy; random gastric biopsy showed mild chronic gastritis and no intestinal metaplasia dysplasia or malignancy. Distal esophagus biopsy showed intestinal metaplasia consistent with Barrett's esophagus and no dysplasia or malignancy.  CTs 02/22/2019-wall thickening of the rectum. A few subcentimeter perirectal lymph nodes. Small bowel to small bowel intussusception within the jejunum which appears to partially resolve on delayed imaging. 2 mm nonspecific pulmonary nodule superior segment left lower lobe.  03/06/2019 lower endoscopic ultrasound-flex impression-malignant partially obstructing tumor in the proximal rectum and in the mid rectum, internal hemorrhoids. EUS impression-rectal mass visualized endosonography, staged T3N1. 1 malignant appearing lymph node was visualized in a peritumoral location. Endosonographic appearance suspicious for metastatic colorectal adenocarcinoma. Internal anal sphincter was visualized and appeared normal. No sign of significant pathology at the rectosigmoid junction and in the sigmoid colon. Prostate noted with calcification within it.  Cycle 1 FOLFOX 03/15/2019  Cycle 2 FOLFOX 03/28/2019  Cycle 3 FOLFOX  04/12/2019  Cycle 4 FOLFOX 04/25/2019  Cycle 5 FOLFOX 05/17/2019, Udenyca added  Cycle 6 FOLFOX 05/31/2019, Udenyca  Cycle 7 FOLFOX 06/14/2019,  Udenyca held  Cycle 8 FOLFOX 06/28/2019  Radiation/Xeloda 07/23/2019-08/30/1998  CTs at [facility] 09/18/2019-no evidence of metastatic disease, no rectal mass, decreased presacral lymph nodes  No rectal mass noted on physical examination 09/11/2019   Sigmoidoscopy 10/02/2019-radiation changes in the distal rectum, no mass, minimal ulcer and nodularity at the anorectal ring, rectal biopsy negative for malignancy  Pelvic MRI 10/08/2019 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 Change in bowel habits secondary to#08 Dec 2018-improved 3. Rectal bleeding secondary to#08 Dec 2018-improved 4. Weight loss 5. Port-A-Cath placement, Dr. Marcello Moores, 03/08/2019     Disposition: Mr. Katen is in clinical remission from rectal cancer.  He appears to have a complete clinical response to the total neoadjuvant therapy.  He plans to meet with Dr. Morton Stall within the next few days to make a decision on proceeding with surgery versus active surveillance.  We flushed the Port-A-Cath today.  He will return for an office visit and Port-A-Cath flush in 8 weeks.  We will plan for restaging CTs by July of this year.  He had a nonspecific pulmonary nodule on the July 2020 CTs.  Betsy Coder, MD  10/24/2019  9:07 AM

## 2019-10-25 ENCOUNTER — Telehealth: Payer: Self-pay | Admitting: Oncology

## 2019-10-25 NOTE — Telephone Encounter (Signed)
Scheduled per los. Called and left msg. mailed printout  

## 2019-11-15 ENCOUNTER — Encounter: Payer: Self-pay | Admitting: Oncology

## 2019-11-16 ENCOUNTER — Other Ambulatory Visit: Payer: Self-pay | Admitting: *Deleted

## 2019-11-16 NOTE — Progress Notes (Signed)
Faxed orders for PAC removal to Dr. Leighton Ruff [phone]

## 2019-11-19 ENCOUNTER — Ambulatory Visit: Payer: Self-pay | Admitting: General Surgery

## 2019-12-12 ENCOUNTER — Encounter: Payer: Self-pay | Admitting: Gastroenterology

## 2019-12-20 ENCOUNTER — Inpatient Hospital Stay: Payer: BC Managed Care – PPO

## 2019-12-20 ENCOUNTER — Inpatient Hospital Stay: Payer: BC Managed Care – PPO | Attending: Oncology | Admitting: Nurse Practitioner

## 2019-12-20 ENCOUNTER — Encounter: Payer: Self-pay | Admitting: Nurse Practitioner

## 2019-12-20 ENCOUNTER — Other Ambulatory Visit: Payer: Self-pay

## 2019-12-20 VITALS — BP 123/77 | HR 84 | Temp 98.9°F | Resp 17 | Ht 69.0 in | Wt 198.0 lb

## 2019-12-20 DIAGNOSIS — Z85048 Personal history of other malignant neoplasm of rectum, rectosigmoid junction, and anus: Secondary | ICD-10-CM | POA: Diagnosis not present

## 2019-12-20 DIAGNOSIS — Z9221 Personal history of antineoplastic chemotherapy: Secondary | ICD-10-CM | POA: Diagnosis not present

## 2019-12-20 DIAGNOSIS — C20 Malignant neoplasm of rectum: Secondary | ICD-10-CM | POA: Diagnosis not present

## 2019-12-20 DIAGNOSIS — Z95828 Presence of other vascular implants and grafts: Secondary | ICD-10-CM

## 2019-12-20 DIAGNOSIS — R634 Abnormal weight loss: Secondary | ICD-10-CM | POA: Insufficient documentation

## 2019-12-20 DIAGNOSIS — Z923 Personal history of irradiation: Secondary | ICD-10-CM | POA: Diagnosis not present

## 2019-12-20 MED ORDER — SODIUM CHLORIDE 0.9% FLUSH
10.0000 mL | INTRAVENOUS | Status: DC | PRN
  Administered 2019-12-20: 10 mL
  Filled 2019-12-20: qty 10

## 2019-12-20 MED ORDER — HEPARIN SOD (PORK) LOCK FLUSH 100 UNIT/ML IV SOLN
500.0000 [IU] | Freq: Once | INTRAVENOUS | Status: AC | PRN
  Administered 2019-12-20: 500 [IU]
  Filled 2019-12-20: qty 5

## 2019-12-20 NOTE — Progress Notes (Addendum)
[facility] OFFICE PROGRESS NOTE   Diagnosis: Rectal cancer  INTERVAL HISTORY:   Jeffrey Day returns as scheduled.  He feels well.  Bowels moving regularly.  No pain or bleeding.  No numbness or tingling in the fingertips.  Toes remain numb.  Objective:  Vital signs in last 24 hours:  Blood pressure 123/77, pulse 84, temperature 98.9 F (37.2 C), temperature source Temporal, resp. rate 17, height 5' 9"  (1.753 m), weight 198 lb (89.8 kg), SpO2 98 %.    HEENT: No thrush or ulcers. Lymphatics: No palpable cervical, supraclavicular, axillary or inguinal lymph nodes. Resp: Lungs clear bilaterally. Cardio: Regular rate and rhythm. GI: Abdomen soft and nontender.  No hepatomegaly. Vascular: No leg edema. Port-A-Cath without erythema.  Lab Results:  Lab Results  Component Value Date   WBC 3.4 (L) 10/24/2019   HGB 14.3 10/24/2019   HCT 41.9 10/24/2019   MCV 96.5 10/24/2019   PLT 183 10/24/2019   NEUTROABS 2.5 10/24/2019    Imaging:  No results found.  Medications: I have reviewed the patient's current medications.  Assessment/Plan: 1. Rectal cancer  Colonoscopy 02/15/2019-frond-like/villous, fungating, infiltrative and ulcerated partially obstructing large mass extending from the dentate line into the distal and mid rectum. Mass partially circumferential. Mass measured 9 cm in length. Biopsies showed invasive adenocarcinoma; preserved expression of the major and minor MMR proteins.   Upper endoscopy 02/15/2019-distal esophagitis, esophageal mucosal changes suspicious for long segment Barrett's esophagus. Biopsy of the duodenum showed peptic duodenitis with no dysplasia or malignancy; random gastric biopsy showed mild chronic gastritis and no intestinal metaplasia dysplasia or malignancy. Distal esophagus biopsy showed intestinal metaplasia consistent with Barrett's esophagus and no dysplasia or malignancy.  CTs 02/22/2019-wall thickening of the rectum. A  few subcentimeter perirectal lymph nodes. Small bowel to small bowel intussusception within the jejunum which appears to partially resolve on delayed imaging. 2 mm nonspecific pulmonary nodule superior segment left lower lobe.  03/06/2019 lower endoscopic ultrasound-flex impression-malignant partially obstructing tumor in the proximal rectum and in the mid rectum, internal hemorrhoids. EUS impression-rectal mass visualized endosonography, staged T3N1. 1 malignant appearing lymph node was visualized in a peritumoral location. Endosonographic appearance suspicious for metastatic colorectal adenocarcinoma. Internal anal sphincter was visualized and appeared normal. No sign of significant pathology at the rectosigmoid junction and in the sigmoid colon. Prostate noted with calcification within it.  Cycle 1 FOLFOX 03/15/2019  Cycle 2 FOLFOX 03/28/2019  Cycle 3 FOLFOX 04/12/2019  Cycle 4 FOLFOX 04/25/2019  Cycle 5 FOLFOX 05/17/2019, Udenyca added  Cycle 6 FOLFOX 05/31/2019, Udenyca  Cycle 7 FOLFOX 06/14/2019, Udenyca held  Cycle 8 FOLFOX 06/28/2019  Radiation/Xeloda 07/23/2019-08/30/1998  CTs at [facility] 09/18/2019-no evidence of metastatic disease, no rectal mass, decreased presacral lymph nodes  No rectal mass noted on physical examination 09/11/2019   Sigmoidoscopy 10/02/2019-radiation changes in the distal rectum, no mass, minimal ulcer and nodularity at the anorectal ring, rectal biopsy negative for malignancy  Pelvic MRI 10/08/2019 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 Change in bowel habits secondary to#08 Dec 2018-improved 3. Rectal bleeding secondary to#08 Dec 2018-improved 4. Weight loss 5. Port-A-Cath placement, Dr. Marcello Day, 03/08/2019    Disposition: Jeffrey Day remains in clinical remission from rectal cancer.  He has elected intensive surveillance.  He will be due for a flexible sigmoidoscopy and  pelvic MRI mid-to-late June.  We are referring him for surveillance CT scans in late June.  We will see him in  follow-up on 01/31/2020.  Patient seen with Dr. Benay Day.    [name] ANP/GNP-BC   12/20/2019  3:38 PM This was a shared visit with [name].  Jeffrey Day is in remission from rectal cancer.  He neuropathy symptoms in the hands have improved, but he continues to have neuropathy in the feet.  He discussed treatment options with Dr. Morton Day and has elected a surveillance program.  He will be due for a sigmoidoscopy and pelvic MRI in June.  He will be scheduled for a 1 year surveillance CT evaluation and office visit in late June.  Julieanne Manson, MD

## 2020-01-12 ENCOUNTER — Encounter: Payer: Self-pay | Admitting: Oncology

## 2020-01-17 ENCOUNTER — Encounter (HOSPITAL_BASED_OUTPATIENT_CLINIC_OR_DEPARTMENT_OTHER): Payer: Self-pay | Admitting: General Surgery

## 2020-01-18 ENCOUNTER — Other Ambulatory Visit: Payer: Self-pay

## 2020-01-18 ENCOUNTER — Encounter (HOSPITAL_BASED_OUTPATIENT_CLINIC_OR_DEPARTMENT_OTHER): Payer: Self-pay | Admitting: General Surgery

## 2020-01-18 NOTE — Progress Notes (Signed)
Spoke w/ via phone for pre-op interview---patient Lab needs dos---- none             Labs: chest xray 03-08-2019 epic COVID test ------01-21-2020@810  Arrive at -------530 am 01-24-2020 NPO after ------midnight Medications to take morning of surgery -----none Diabetic medication -----n/a Patient Special Instructions -----none Pre-Op special Istructions -----none Patient verbalized understanding of instructions that were given at this phone interview. Patient denies shortness of breath, chest pain, fever, cough a this phone interview.

## 2020-01-21 ENCOUNTER — Other Ambulatory Visit (HOSPITAL_COMMUNITY)
Admission: RE | Admit: 2020-01-21 | Discharge: 2020-01-21 | Disposition: A | Discharge status: Home / Self Care | Payer: BC Managed Care – PPO | Source: Ambulatory Visit | Attending: General Surgery | Admitting: General Surgery

## 2020-01-21 DIAGNOSIS — Z01812 Encounter for preprocedural laboratory examination: Secondary | ICD-10-CM | POA: Diagnosis not present

## 2020-01-21 DIAGNOSIS — Z20822 Contact with and (suspected) exposure to covid-19: Secondary | ICD-10-CM | POA: Insufficient documentation

## 2020-01-21 LAB — SARS CORONAVIRUS 2 (TAT 6-24 HRS): SARS Coronavirus 2: NEGATIVE

## 2020-01-23 ENCOUNTER — Encounter: Payer: Self-pay | Admitting: Oncology

## 2020-01-23 NOTE — Anesthesia Preprocedure Evaluation (Addendum)
Anesthesia Evaluation  Patient identified by MRN, date of birth, ID band Patient awake    Reviewed: Allergy & Precautions, NPO status , Patient's Chart, lab work & pertinent test results  Airway Mallampati: II  TM Distance: >3 FB Neck ROM: Full    Dental no notable dental hx. (+) Teeth Intact, Dental Advisory Given   Pulmonary neg pulmonary ROS, Patient abstained from smoking.,    Pulmonary exam normal breath sounds clear to auscultation       Cardiovascular negative cardio ROS Normal cardiovascular exam Rhythm:Regular Rate:Normal     Neuro/Psych negative neurological ROS  negative psych ROS   GI/Hepatic negative GI ROS, Neg liver ROS,   Endo/Other  negative endocrine ROS  Renal/GU negative Renal ROS  negative genitourinary   Musculoskeletal negative musculoskeletal ROS (+)   Abdominal   Peds negative pediatric ROS (+)  Hematology negative hematology ROS (+)   Anesthesia Other Findings   Reproductive/Obstetrics negative OB ROS                           Anesthesia Physical  Anesthesia Plan  ASA: II  Anesthesia Plan: MAC   Post-op Pain Management:    Induction: Intravenous  PONV Risk Score and Plan: 2 and Ondansetron and Treatment may vary due to age or medical condition  Airway Management Planned: Nasal Cannula, Simple Face Mask and Mask  Additional Equipment:   Intra-op Plan:   Post-operative Plan: Extubation in OR  Informed Consent: I have reviewed the patients History and Physical, chart, labs and discussed the procedure including the risks, benefits and alternatives for the proposed anesthesia with the patient or authorized representative who has indicated his/her understanding and acceptance.       Plan Discussed with: CRNA, Surgeon and Anesthesiologist  Anesthesia Plan Comments:        Anesthesia Quick Evaluation

## 2020-01-24 ENCOUNTER — Ambulatory Visit (HOSPITAL_BASED_OUTPATIENT_CLINIC_OR_DEPARTMENT_OTHER)
Admission: RE | Admit: 2020-01-24 | Discharge: 2020-01-24 | Disposition: A | Discharge status: Home / Self Care | Payer: BC Managed Care – PPO | Attending: General Surgery | Admitting: General Surgery

## 2020-01-24 ENCOUNTER — Other Ambulatory Visit: Payer: Self-pay

## 2020-01-24 ENCOUNTER — Ambulatory Visit (HOSPITAL_BASED_OUTPATIENT_CLINIC_OR_DEPARTMENT_OTHER): Payer: BC Managed Care – PPO | Admitting: Anesthesiology

## 2020-01-24 ENCOUNTER — Telehealth: Payer: Self-pay

## 2020-01-24 ENCOUNTER — Encounter (HOSPITAL_BASED_OUTPATIENT_CLINIC_OR_DEPARTMENT_OTHER): Payer: Self-pay | Admitting: General Surgery

## 2020-01-24 ENCOUNTER — Encounter (HOSPITAL_BASED_OUTPATIENT_CLINIC_OR_DEPARTMENT_OTHER)
Admission: RE | Disposition: A | Discharge status: Home / Self Care | Payer: Self-pay | Source: Home / Self Care | Attending: General Surgery

## 2020-01-24 DIAGNOSIS — Z452 Encounter for adjustment and management of vascular access device: Secondary | ICD-10-CM | POA: Insufficient documentation

## 2020-01-24 DIAGNOSIS — Z91010 Allergy to peanuts: Secondary | ICD-10-CM | POA: Insufficient documentation

## 2020-01-24 DIAGNOSIS — Z85048 Personal history of other malignant neoplasm of rectum, rectosigmoid junction, and anus: Secondary | ICD-10-CM | POA: Insufficient documentation

## 2020-01-24 DIAGNOSIS — Z801 Family history of malignant neoplasm of trachea, bronchus and lung: Secondary | ICD-10-CM | POA: Diagnosis not present

## 2020-01-24 DIAGNOSIS — Z8249 Family history of ischemic heart disease and other diseases of the circulatory system: Secondary | ICD-10-CM | POA: Insufficient documentation

## 2020-01-24 DIAGNOSIS — Z880 Allergy status to penicillin: Secondary | ICD-10-CM | POA: Insufficient documentation

## 2020-01-24 DIAGNOSIS — Z9221 Personal history of antineoplastic chemotherapy: Secondary | ICD-10-CM | POA: Insufficient documentation

## 2020-01-24 DIAGNOSIS — Z8719 Personal history of other diseases of the digestive system: Secondary | ICD-10-CM | POA: Diagnosis not present

## 2020-01-24 DIAGNOSIS — C20 Malignant neoplasm of rectum: Secondary | ICD-10-CM | POA: Diagnosis not present

## 2020-01-24 HISTORY — PX: PORT-A-CATH REMOVAL: SHX5289

## 2020-01-24 SURGERY — REMOVAL PORT-A-CATH
Anesthesia: Monitor Anesthesia Care | Site: Chest | Laterality: Right

## 2020-01-24 MED ORDER — ONDANSETRON HCL 4 MG/2ML IJ SOLN: 4.0000 mg | Freq: Once | INTRAMUSCULAR | Status: DC | PRN

## 2020-01-24 MED ORDER — OXYCODONE HCL 5 MG PO TABS: 5.0000 mg | ORAL_TABLET | Freq: Once | ORAL | Status: DC | PRN

## 2020-01-24 MED ORDER — CLINDAMYCIN PHOSPHATE 900 MG/50ML IV SOLN
900.0000 mg | INTRAVENOUS | Status: AC
  Administered 2020-01-24: 900 mg via INTRAVENOUS

## 2020-01-24 MED ORDER — LIDOCAINE 2% (20 MG/ML) 5 ML SYRINGE
INTRAMUSCULAR | Status: AC
  Filled 2020-01-24: qty 5

## 2020-01-24 MED ORDER — BUPIVACAINE-EPINEPHRINE 0.5% -1:200000 IJ SOLN
INTRAMUSCULAR | Status: DC | PRN
  Administered 2020-01-24: 20 mL

## 2020-01-24 MED ORDER — PROPOFOL 500 MG/50ML IV EMUL
INTRAVENOUS | Status: AC
  Filled 2020-01-24: qty 50

## 2020-01-24 MED ORDER — FENTANYL CITRATE (PF) 100 MCG/2ML IJ SOLN
INTRAMUSCULAR | Status: DC | PRN
  Administered 2020-01-24 (×2): 50 ug via INTRAVENOUS

## 2020-01-24 MED ORDER — OXYCODONE HCL 5 MG/5ML PO SOLN: 5.0000 mg | Freq: Once | ORAL | Status: DC | PRN

## 2020-01-24 MED ORDER — FENTANYL CITRATE (PF) 100 MCG/2ML IJ SOLN
INTRAMUSCULAR | Status: AC
  Filled 2020-01-24: qty 2

## 2020-01-24 MED ORDER — MIDAZOLAM HCL 2 MG/2ML IJ SOLN
INTRAMUSCULAR | Status: AC
  Filled 2020-01-24: qty 2

## 2020-01-24 MED ORDER — SODIUM CHLORIDE 0.9% FLUSH: 3.0000 mL | Freq: Two times a day (BID) | INTRAVENOUS | Status: DC

## 2020-01-24 MED ORDER — ACETAMINOPHEN 160 MG/5ML PO SOLN: 325.0000 mg | ORAL | Status: DC | PRN

## 2020-01-24 MED ORDER — FENTANYL CITRATE (PF) 100 MCG/2ML IJ SOLN: 25.0000 ug | INTRAMUSCULAR | Status: DC | PRN

## 2020-01-24 MED ORDER — PROPOFOL 500 MG/50ML IV EMUL
INTRAVENOUS | Status: DC | PRN
  Administered 2020-01-24: 200 ug/kg/min via INTRAVENOUS

## 2020-01-24 MED ORDER — ONDANSETRON HCL 4 MG/2ML IJ SOLN
INTRAMUSCULAR | Status: AC
  Filled 2020-01-24: qty 2

## 2020-01-24 MED ORDER — MIDAZOLAM HCL 5 MG/5ML IJ SOLN
INTRAMUSCULAR | Status: DC | PRN
  Administered 2020-01-24: 2 mg via INTRAVENOUS

## 2020-01-24 MED ORDER — CLINDAMYCIN PHOSPHATE 900 MG/50ML IV SOLN
INTRAVENOUS | Status: AC
  Filled 2020-01-24: qty 50

## 2020-01-24 MED ORDER — MEPERIDINE HCL 25 MG/ML IJ SOLN: 6.2500 mg | INTRAMUSCULAR | Status: DC | PRN

## 2020-01-24 MED ORDER — LIDOCAINE HCL (CARDIAC) PF 100 MG/5ML IV SOSY
PREFILLED_SYRINGE | INTRAVENOUS | Status: DC | PRN
  Administered 2020-01-24: 60 mg via INTRAVENOUS

## 2020-01-24 MED ORDER — LACTATED RINGERS IV SOLN: INTRAVENOUS | Status: DC

## 2020-01-24 MED ORDER — ONDANSETRON HCL 4 MG/2ML IJ SOLN
INTRAMUSCULAR | Status: DC | PRN
  Administered 2020-01-24: 4 mg via INTRAVENOUS

## 2020-01-24 MED ORDER — ACETAMINOPHEN 325 MG PO TABS: 325.0000 mg | ORAL_TABLET | ORAL | Status: DC | PRN

## 2020-01-24 SURGICAL SUPPLY — 36 items
BLADE CLIPPER SENSICLIP SURGIC (BLADE) ×3 IMPLANT
BLADE HEX COATED 2.75 (ELECTRODE) IMPLANT
BLADE SURG 15 STRL LF DISP TIS (BLADE) ×1 IMPLANT
BLADE SURG 15 STRL SS (BLADE) ×2
CANISTER SUCT 1200ML W/VALVE (MISCELLANEOUS) IMPLANT
CHLORAPREP W/TINT 26 (MISCELLANEOUS) ×3 IMPLANT
COVER BACK TABLE 60X90IN (DRAPES) ×3 IMPLANT
COVER MAYO STAND STRL (DRAPES) ×3 IMPLANT
COVER WAND RF STERILE (DRAPES) ×3 IMPLANT
DECANTER SPIKE VIAL GLASS SM (MISCELLANEOUS) IMPLANT
DERMABOND ADVANCED (GAUZE/BANDAGES/DRESSINGS) ×2
DERMABOND ADVANCED .7 DNX12 (GAUZE/BANDAGES/DRESSINGS) ×1 IMPLANT
DRAPE LAPAROTOMY TRNSV 102X78 (DRAPES) ×3 IMPLANT
DRAPE UTILITY XL STRL (DRAPES) ×3 IMPLANT
ELECT REM PT RETURN 9FT ADLT (ELECTROSURGICAL) ×3
ELECTRODE REM PT RTRN 9FT ADLT (ELECTROSURGICAL) ×1 IMPLANT
GAUZE SPONGE 4X4 12PLY STRL (GAUZE/BANDAGES/DRESSINGS) ×3 IMPLANT
GLOVE BIO SURGEON STRL SZ 6.5 (GLOVE) ×2 IMPLANT
GLOVE BIO SURGEONS STRL SZ 6.5 (GLOVE) ×1
GLOVE BIOGEL PI IND STRL 7.0 (GLOVE) ×1 IMPLANT
GLOVE BIOGEL PI INDICATOR 7.0 (GLOVE) ×2
GOWN STRL REUS W/TWL 2XL LVL3 (GOWN DISPOSABLE) ×3 IMPLANT
NEEDLE HYPO 22GX1.5 SAFETY (NEEDLE) ×3 IMPLANT
NS IRRIG 500ML POUR BTL (IV SOLUTION) ×3 IMPLANT
PENCIL BUTTON HOLSTER BLD 10FT (ELECTRODE) ×3 IMPLANT
SET BASIN DAY SURGERY F.S. (CUSTOM PROCEDURE TRAY) ×3 IMPLANT
SPONGE LAP 4X18 RFD (DISPOSABLE) IMPLANT
SUT VIC AB 3-0 SH 27 (SUTURE) ×2
SUT VIC AB 3-0 SH 27X BRD (SUTURE) ×1 IMPLANT
SUT VICRYL 4-0 PS2 18IN ABS (SUTURE) ×3 IMPLANT
SYR BULB IRRIG 60ML STRL (SYRINGE) ×3 IMPLANT
SYR CONTROL 10ML LL (SYRINGE) ×3 IMPLANT
TOWEL OR 17X26 10 PK STRL BLUE (TOWEL DISPOSABLE) ×3 IMPLANT
TUBE CONNECTING 12'X1/4 (SUCTIONS) ×1
TUBE CONNECTING 12X1/4 (SUCTIONS) ×2 IMPLANT
YANKAUER SUCT BULB TIP NO VENT (SUCTIONS) ×3 IMPLANT

## 2020-01-24 NOTE — Transfer of Care (Signed)
Immediate Anesthesia Transfer of Care Note  Patient: Jeffrey Day.  Procedure(s) Performed: PAC REMOVAL (Right Chest)  Patient Location: PACU  Anesthesia Type:MAC  Level of Consciousness: awake, alert  and oriented  Airway & Oxygen Therapy: Patient Spontanous Breathing  Post-op Assessment: Report given to RN and Post -op Vital signs reviewed and stable  Post vital signs: Reviewed and stable  Last Vitals:  Vitals Value Taken Time  BP    Temp    Pulse 88 01/24/20 0757  Resp 17 01/24/20 0757  SpO2 97 % 01/24/20 0757  Vitals shown include unvalidated device data.  Last Pain:  Vitals:   01/24/20 0556  TempSrc:   PainSc: 0-No pain      Patients Stated Pain Goal: 2 (32/67/12 4580)  Complications: No complications documented.

## 2020-01-24 NOTE — Anesthesia Postprocedure Evaluation (Signed)
Anesthesia Post Note  Patient: Jeffrey Day.  Procedure(s) Performed: PAC REMOVAL (Right Chest)     Patient location during evaluation: PACU Anesthesia Type: MAC Level of consciousness: awake and alert Pain management: pain level controlled Vital Signs Assessment: post-procedure vital signs reviewed and stable Respiratory status: spontaneous breathing, nonlabored ventilation, respiratory function stable and patient connected to nasal cannula oxygen Cardiovascular status: stable and blood pressure returned to baseline Postop Assessment: no apparent nausea or vomiting Anesthetic complications: no   No complications documented.  Last Vitals:  Vitals:   01/24/20 0826 01/24/20 0858  BP: 114/77 (!) 128/93  Pulse: 71 64  Resp: 10 16  Temp:    SpO2: 98% 100%    Last Pain:  Vitals:   01/24/20 0858  TempSrc:   PainSc: 0-No pain                 Silveria Botz

## 2020-01-24 NOTE — H&P (Signed)
CC: port in place    HPI: Jeffrey Day. is an 44 y.o. male who is here for removal of port.  Past Medical History:  Diagnosis Date  . Allergy   . Barrett's esophagus   . Rectal cancer [facility])     Past Surgical History:  Procedure Laterality Date  . COLONOSCOPY WITH ESOPHAGOGASTRODUODENOSCOPY (EGD)     biopsy  . EUS N/A 03/06/2019   Procedure: LOWER ENDOSCOPIC ULTRASOUND (EUS);  Surgeon: Irving Copas., MD;  Location: [place];  Service: Gastroenterology;  Laterality: N/A;  . FINGER SURGERY  2013   Traumatic Amputation   . FLEXIBLE SIGMOIDOSCOPY N/A 03/06/2019   Procedure: FLEXIBLE SIGMOIDOSCOPY;  Surgeon: Rush Landmark Telford Nab., MD;  Location: [place];  Service: Gastroenterology;  Laterality: N/A;  . PORTACATH PLACEMENT N/A 03/08/2019   Procedure: INSERTION PORT-A-CATH WITH ULTRASOUND GUIDANCE;  Surgeon: Leighton Ruff, MD;  Location: [facility];  Service: General;  Laterality: N/A;  LMA    Family History  Problem Relation Age of Onset  . Lung cancer Paternal Grandmother   . Heart disease Maternal Grandmother   . Hypertension Neg Hx   . Stroke Neg Hx   . Colon cancer Neg Hx   . Esophageal cancer Neg Hx   . Colitis Neg Hx   . Inflammatory bowel disease Neg Hx   . Liver disease Neg Hx   . Pancreatic cancer Neg Hx   . Rectal cancer Neg Hx   . Stomach cancer Neg Hx     Social:  reports that he has never smoked. His smokeless tobacco use includes chew. He reports current alcohol use. He reports that he does not use drugs.  Allergies:  Allergies  Allergen Reactions  . Peanut Oil Anaphylaxis  . Penicillins Other (See Comments)    Childhood allergy Did it involve swelling of the face/tongue/throat, SOB, or low BP? Unknown Did it involve sudden or severe rash/hives, skin peeling, or any reaction on the inside of your mouth or nose? Unknown Did you need to seek medical attention at a hospital or doctor's office? Unknown When did it last  happen?childhood allergy If all above answers are "NO", may proceed with cephalosporin use.     Medications: I have reviewed the patient's current medications.  No results found for this or any previous visit (from the past 48 hour(s)).  No results found.  ROS - all of the below systems have been reviewed with the patient and positives are indicated with bold text General: chills, fever or night sweats Eyes: blurry vision or double vision ENT: epistaxis or sore throat Allergy/Immunology: itchy/watery eyes or nasal congestion Hematologic/Lymphatic: bleeding problems, blood clots or swollen lymph nodes Endocrine: temperature intolerance or unexpected weight changes Breast: new or changing breast lumps or nipple discharge Resp: cough, shortness of breath, or wheezing CV: chest pain or dyspnea on exertion GI: as per HPI GU: dysuria, trouble voiding, or hematuria MSK: joint pain or joint stiffness Neuro: TIA or stroke symptoms Derm: pruritus and skin lesion changes Psych: anxiety and depression  PE Blood pressure (!) 136/98, pulse 70, temperature 97.7 F (36.5 C), temperature source Oral, resp. rate 14, height 5\' 8"  (1.727 m), weight 89.4 kg, SpO2 100 %. Constitutional: NAD; conversant; no deformities Eyes: Moist conjunctiva; no lid lag; anicteric; PERRL Neck: Trachea midline; no thyromegaly Lungs: Normal respiratory effort; no tactile fremitus CV: RRR; no palpable thrills; no pitting edema GI: Abd ; no palpable hepatosplenomegaly MSK: Normal range of motion of extremities; no clubbing/cyanosis Psychiatric: Appropriate  affect; alert and oriented x3 Lymphatic: No palpable cervical or axillary lymphadenopathy  No results found for this or any previous visit (from the past 48 hour(s)).  No results found.   A/P: Jeffrey Day. is an 44 y.o. male with colon cancer s/p port placement.  He has completed therapy and is ready for removal.  Risks include bleeding,  infection and damage to adjacent structures.  All questions answered.   Rosario Adie, MD  [facility]

## 2020-01-24 NOTE — Telephone Encounter (Signed)
TC to pt in regard to patients question about wanting to know if his wife could come with him to his visit on the 24th. Unable to reach patient, left voicemail stating that we are allowing 1 visit to doctors appointments only and if he had any further questions to give [place] a call back.

## 2020-01-24 NOTE — OR Nursing (Signed)
Port-A-Cath removed from right chest per Dr. Marcello Moores in [place] #3.

## 2020-01-24 NOTE — Discharge Instructions (Addendum)
GENERAL SURGERY: POST OP INSTRUCTIONS  1. DIET: Follow a light bland diet the first 24 hours after arrival home, such as soup, liquids, crackers, etc.  Be sure to include lots of fluids daily.  Avoid fast food or heavy meals as your are more likely to get nauseated.   2. Take your usually prescribed home medications unless otherwise directed. 3. PAIN CONTROL: a. Pain is best controlled by a usual combination of three different methods TOGETHER: i. Ice/Heat ii. Over the counter pain medication b. Most patients will experience some swelling and bruising around the incisions.  Ice packs or heating pads (30-60 minutes up to 6 times a day) will help. Use ice for the first few days to help decrease swelling and bruising, then switch to heat to help relax tight/sore spots and speed recovery.  Some people prefer to use ice alone, heat alone, alternating between ice & heat.  Experiment to what works for you.  Swelling and bruising can take several weeks to resolve.   c. It is helpful to take an over-the-counter pain medication regularly for the first few weeks.  Choose one of the following that works best for you: i. Naproxen (Aleve, etc)  Two 220mg  tabs twice a day ii. Ibuprofen (Advil, etc) Three 200mg  tabs four times a day (every meal & bedtime)  4. Wash / shower every day.  You may shower over the dressings as they are waterproof.  Continue to shower over incision(s) after the dressing is off. 5. You may leave the incision open to air.  You have skin glue (Dermabond) covering the incision(s).  It will peel off in about one week.   You may replace a dressing/Band-Aid to cover the incision for comfort if you wish.    6. ACTIVITIES as tolerated:   a. You may resume regular (light) daily activities beginning the next day--such as daily self-care, walking, climbing stairs--gradually increasing activities as tolerated.  If you can walk 30 minutes without difficulty, it is safe to try more intense activity  such as jogging, treadmill, bicycling, low-impact aerobics, swimming, etc. b. Don't do any strenuous activity with that arm for 48h c. Save the most intensive and strenuous activity for last such as sit-ups, heavy lifting, contact sports, etc  Refrain from any heavy lifting or straining until you are off narcotics for pain control.   d. DO NOT PUSH THROUGH PAIN.  Let pain be your guide: If it hurts to do something, don't do it.  Pain is your body warning you to avoid that activity for another week until the pain goes down. e. You may drive when you are no longer taking prescription pain medication, you can comfortably wear a seatbelt, and you can safely maneuver your car and apply brakes. f. You may have sexual intercourse when it is comfortable.  7. FOLLOW UP in our office a. Please call [facility] at [phone] to set up an appointment to see your surgeon in the office for a follow-up appointment approximately 2-3 weeks after your surgery. b. Make sure that you call for this appointment the day you arrive home to insure a convenient appointment time. 9. IF YOU HAVE DISABILITY OR FAMILY LEAVE FORMS, BRING THEM TO THE OFFICE FOR PROCESSING.  DO NOT GIVE THEM TO YOUR DOCTOR.   WHEN TO CALL us [phone]: 1. Poor pain control 2. Reactions / problems with new medications (rash/itching, nausea, etc)  3. Fever over 101.5 F (38.5 C) 4. Worsening swelling or bruising 5. Continued  bleeding from incision. 6. Increased pain, redness, or drainage from the incision   The clinic staff is available to answer your questions during regular business hours (8:30am-5pm).  Please don't hesitate to call and ask to speak to one of our nurses for clinical concerns.   If you have a medical emergency, go to the nearest emergency room or call 911.  A surgeon from [facility] is always on call at the [facility], [place], [place], [place], [place]  [address]  ? MAIN: [phone] ? TOLL FREE: [phone] ?  FAX [phone] [email or web address]     Post Anesthesia Home Care Instructions  Activity: Get plenty of rest for the remainder of the day. A responsible individual must stay with you for 24 hours following the procedure.  For the next 24 hours, DO NOT: -Drive a car -Paediatric nurse -Drink alcoholic beverages -Take any medication unless instructed by your physician -Make any legal decisions or sign important papers.  Meals: Start with liquid foods such as gelatin or soup. Progress to regular foods as tolerated. Avoid greasy, spicy, heavy foods. If nausea and/or vomiting occur, drink only clear liquids until the nausea and/or vomiting subsides. Call your physician if vomiting continues.  Special Instructions/Symptoms: Your throat may feel dry or sore from the anesthesia or the breathing tube placed in your throat during surgery. If this causes discomfort, gargle with warm salt water. The discomfort should disappear within 24 hours.  If you had a scopolamine patch placed behind your ear for the management of post- operative nausea and/or vomiting:  1. The medication in the patch is effective for 72 hours, after which it should be removed.  Wrap patch in a tissue and discard in the trash. Wash hands thoroughly with soap and water. 2. You may remove the patch earlier than 72 hours if you experience unpleasant side effects which may include dry mouth, dizziness or visual disturbances. 3. Avoid touching the patch. Wash your hands with soap and water after contact with the patch.

## 2020-01-24 NOTE — Op Note (Signed)
01/24/2020  7:52 AM  PATIENT:  Jeffrey Day.  44 y.o. male  Patient Care Team: Vivi Barrack, MD as PCP - General (Family Medicine) Virgina Evener, Dawn, RN (Inactive) as Oncology Nurse Navigator Leighton Ruff, MD as Consulting Physician (General Surgery) Ladell Pier, MD as Consulting Physician (Oncology)  PRE-OPERATIVE DIAGNOSIS:  RECTAL CANCER  POST-OPERATIVE DIAGNOSIS:  RECTAL CANCER  PROCEDURE:   PAC REMOVAL   Surgeon(s): Leighton Ruff, MD  ASSISTANT: none   ANESTHESIA:   local and MAC  EBL: 10 ml Total I/O In: 300 [I.V.:300] Out: -   DRAINS: none   SPECIMEN:  No Specimen  DISPOSITION OF SPECIMEN:  N/A  COUNTS:  YES  PLAN OF CARE: Discharge to home after PACU  PATIENT DISPOSITION:  PACU - hemodynamically stable.  INDICATION: 44 y.o. M s/p chemotherapy.  Ready for port removal.   OR FINDINGS: port intact  DESCRIPTION: the patient was identified in the preoperative holding area and taken to the OR where they were laid supine on the operating room table.  MAC anesthesia was induced without difficulty. SCDs were also noted to be in place prior to the initiation of anesthesia.  The patient was then prepped and draped in the usual sterile fashion.   A surgical timeout was performed indicating the correct patient, procedure, positioning and need for preoperative antibiotics.   I began by injecting half percent Marcaine with epinephrine subcutaneously as a field block.  I then incised his previous scar using a 15 blade scalpel.  I carried my dissection down to the level of the port using the scalpel.  I divided the capsule around the port using electrocautery.  The 2 stay sutures were removed using suture scissors.  The port was then removed from the cavity.  A 3-0 Vicryl pursestring suture was placed around the entrance of the catheter.  The catheter was slowly removed and the pursestring was tied tightly around this.  I then closed the capsule edge using a  running 3-0 Vicryl suture.  The skin was closed using a running 4-0 subcuticular suture and Dermabond.  The patient was then awakened from anesthesia and sent to the postanesthesia care unit in stable condition.  All counts were correct per operating room staff.

## 2020-01-25 ENCOUNTER — Encounter (HOSPITAL_BASED_OUTPATIENT_CLINIC_OR_DEPARTMENT_OTHER): Payer: Self-pay | Admitting: General Surgery

## 2020-01-25 ENCOUNTER — Inpatient Hospital Stay: Payer: BC Managed Care – PPO | Attending: Oncology

## 2020-01-25 ENCOUNTER — Other Ambulatory Visit: Payer: Self-pay

## 2020-01-25 DIAGNOSIS — D709 Neutropenia, unspecified: Secondary | ICD-10-CM | POA: Diagnosis not present

## 2020-01-25 DIAGNOSIS — R911 Solitary pulmonary nodule: Secondary | ICD-10-CM | POA: Insufficient documentation

## 2020-01-25 DIAGNOSIS — Z9221 Personal history of antineoplastic chemotherapy: Secondary | ICD-10-CM | POA: Insufficient documentation

## 2020-01-25 DIAGNOSIS — R634 Abnormal weight loss: Secondary | ICD-10-CM | POA: Diagnosis not present

## 2020-01-25 DIAGNOSIS — T451X5A Adverse effect of antineoplastic and immunosuppressive drugs, initial encounter: Secondary | ICD-10-CM | POA: Diagnosis not present

## 2020-01-25 DIAGNOSIS — Z85048 Personal history of other malignant neoplasm of rectum, rectosigmoid junction, and anus: Secondary | ICD-10-CM | POA: Diagnosis present

## 2020-01-25 DIAGNOSIS — C20 Malignant neoplasm of rectum: Secondary | ICD-10-CM

## 2020-01-25 LAB — BASIC METABOLIC PANEL - CANCER CENTER ONLY
Anion gap: 8 (ref 5–15)
BUN: 12 mg/dL (ref 6–20)
CO2: 26 mmol/L (ref 22–32)
Calcium: 9.5 mg/dL (ref 8.9–10.3)
Chloride: 103 mmol/L (ref 98–111)
Creatinine: 0.99 mg/dL (ref 0.61–1.24)
GFR, Est AFR Am: >60 mL/min (ref >60)
GFR, Estimated: >60 mL/min (ref >60)
Glucose, Bld: 139 mg/dL — ABNORMAL HIGH (ref 70–99)
Potassium: 5.1 mmol/L (ref 3.5–5.1)
Sodium: 137 mmol/L (ref 135–145)

## 2020-01-25 LAB — CEA (IN HOUSE-CHCC): CEA (CHCC-In House): 3.74 ng/mL (ref 0.00–5.00)

## 2020-01-28 ENCOUNTER — Other Ambulatory Visit: Payer: Self-pay

## 2020-01-28 ENCOUNTER — Ambulatory Visit (HOSPITAL_COMMUNITY)
Admission: RE | Admit: 2020-01-28 | Discharge: 2020-01-28 | Disposition: A | Discharge status: Home / Self Care | Payer: BC Managed Care – PPO | Source: Ambulatory Visit | Attending: Nurse Practitioner | Admitting: Nurse Practitioner

## 2020-01-28 ENCOUNTER — Encounter (HOSPITAL_COMMUNITY): Payer: Self-pay

## 2020-01-28 DIAGNOSIS — C20 Malignant neoplasm of rectum: Secondary | ICD-10-CM | POA: Diagnosis not present

## 2020-01-28 MED ORDER — SODIUM CHLORIDE (PF) 0.9 % IJ SOLN
INTRAMUSCULAR | Status: AC
  Filled 2020-01-28: qty 50

## 2020-01-28 MED ORDER — IOHEXOL 300 MG/ML  SOLN
100.0000 mL | Freq: Once | INTRAMUSCULAR | Status: AC | PRN
  Administered 2020-01-28: 100 mL via INTRAVENOUS

## 2020-01-31 ENCOUNTER — Other Ambulatory Visit: Payer: Self-pay

## 2020-01-31 ENCOUNTER — Inpatient Hospital Stay (HOSPITAL_BASED_OUTPATIENT_CLINIC_OR_DEPARTMENT_OTHER): Payer: BC Managed Care – PPO | Admitting: Oncology

## 2020-01-31 VITALS — BP 128/86 | HR 69 | Temp 97.9°F | Resp 18 | Ht 68.0 in | Wt 200.1 lb

## 2020-01-31 DIAGNOSIS — C20 Malignant neoplasm of rectum: Secondary | ICD-10-CM

## 2020-01-31 DIAGNOSIS — Z85048 Personal history of other malignant neoplasm of rectum, rectosigmoid junction, and anus: Secondary | ICD-10-CM | POA: Diagnosis not present

## 2020-01-31 NOTE — Progress Notes (Signed)
[place] OFFICE PROGRESS NOTE   Diagnosis: Rectal cancer  INTERVAL HISTORY:   [name] returns as scheduled.  He feels well.  No difficulty with bowel function.  He is working.  He continues to have neuropathy symptoms in the fingers and toes.  This does not interfere with activity.  Numbness is worse in the toes.  The Port-A-Cath was removed last week.  Objective:  Vital signs in last 24 hours:  Blood pressure 128/86, pulse 69, temperature 97.9 F (36.6 C), temperature source Temporal, resp. rate 18, height 5' 8" (1.727 m), weight 200 lb 1.6 oz (90.8 kg), SpO2 100 %.    HEENT: Neck without mass Lymphatics: No cervical, supraclavicular, axillary, or inguinal nodes Resp: Lungs clear bilaterally Cardio: Regular rate and rhythm GI: No hepatosplenomegaly, nontender, no mass Vascular: No leg edema Skin: Mild erythematous demarcated rash over the back    Lab Results:  Lab Results  Component Value Date   WBC 3.4 (L) 10/24/2019   HGB 14.3 10/24/2019   HCT 41.9 10/24/2019   MCV 96.5 10/24/2019   PLT 183 10/24/2019   NEUTROABS 2.5 10/24/2019    CMP  Lab Results  Component Value Date   NA 137 01/25/2020   K 5.1 01/25/2020   CL 103 01/25/2020   CO2 26 01/25/2020   GLUCOSE 139 (H) 01/25/2020   BUN 12 01/25/2020   CREATININE 0.99 01/25/2020   CALCIUM 9.5 01/25/2020   PROT 7.3 10/24/2019   ALBUMIN 4.2 10/24/2019   AST 29 10/24/2019   ALT 30 10/24/2019   ALKPHOS 96 10/24/2019   BILITOT 0.6 10/24/2019   GFRNONAA >60 01/25/2020   GFRAA >60 01/25/2020    Lab Results  Component Value Date   CEA1 3.74 01/25/2020     Imaging:  CT Chest W Contrast  Result Date: 01/28/2020 CLINICAL DATA:  Follow-up rectal carcinoma. Recently completed radiation therapy and chemotherapy. EXAM: CT CHEST, ABDOMEN, AND PELVIS WITH CONTRAST TECHNIQUE: Multidetector CT imaging of the chest, abdomen and pelvis was performed following the standard protocol during bolus  administration of intravenous contrast. CONTRAST:  161m OMNIPAQUE IOHEXOL 300 MG/ML  SOLN COMPARISON:  02/22/2019 FINDINGS: CT CHEST FINDINGS Cardiovascular: No acute findings. Mediastinum/Lymph Nodes: No masses or pathologically enlarged lymph nodes identified. Lungs/Pleura: No pulmonary infiltrate or mass identified. No effusion present. A 4 mm pulmonary nodule in the left lower lobe on image 101/6 remains stable since previous study. No new or enlarging pulmonary nodules identified. Musculoskeletal:  No suspicious bone lesions identified. CT ABDOMEN AND PELVIS FINDINGS Hepatobiliary: No masses identified. Gallbladder is unremarkable. No evidence of biliary ductal dilatation. Pancreas:  No mass or inflammatory changes. Spleen:  Within normal limits in size and appearance. Adrenals/Urinary tract:  No masses or hydronephrosis. Stomach/Bowel: No evidence of obstruction, inflammatory process, or abnormal fluid collections. Normal appendix visualized. Decreased rectal wall thickening since previous study. Vascular/Lymphatic: Previously seen tiny perirectal lymph nodes are no longer visualized. No pathologically enlarged lymph nodes identified. No abdominal aortic aneurysm. Reproductive:  No mass or other significant abnormality identified. Other:  None. Musculoskeletal: No suspicious bone lesions identified. Bilateral pars defects again seen, with mild degenerative disc disease and grade 1 anterolisthesis at L5-S1. IMPRESSION: 1. No evidence of recurrent or metastatic carcinoma within the chest, abdomen, or pelvis. 2. Stable 4 mm left lower lobe pulmonary nodule. Recommend continued attention on follow-up imaging. Electronically Signed   By: JMarlaine HindM.D.   On: 01/28/2020 11:45   CT Abdomen Pelvis W Contrast  Result  Date: 01/28/2020 CLINICAL DATA:  Follow-up rectal carcinoma. Recently completed radiation therapy and chemotherapy. EXAM: CT CHEST, ABDOMEN, AND PELVIS WITH CONTRAST TECHNIQUE: Multidetector CT  imaging of the chest, abdomen and pelvis was performed following the standard protocol during bolus administration of intravenous contrast. CONTRAST:  127m OMNIPAQUE IOHEXOL 300 MG/ML  SOLN COMPARISON:  02/22/2019 FINDINGS: CT CHEST FINDINGS Cardiovascular: No acute findings. Mediastinum/Lymph Nodes: No masses or pathologically enlarged lymph nodes identified. Lungs/Pleura: No pulmonary infiltrate or mass identified. No effusion present. A 4 mm pulmonary nodule in the left lower lobe on image 101/6 remains stable since previous study. No new or enlarging pulmonary nodules identified. Musculoskeletal:  No suspicious bone lesions identified. CT ABDOMEN AND PELVIS FINDINGS Hepatobiliary: No masses identified. Gallbladder is unremarkable. No evidence of biliary ductal dilatation. Pancreas:  No mass or inflammatory changes. Spleen:  Within normal limits in size and appearance. Adrenals/Urinary tract:  No masses or hydronephrosis. Stomach/Bowel: No evidence of obstruction, inflammatory process, or abnormal fluid collections. Normal appendix visualized. Decreased rectal wall thickening since previous study. Vascular/Lymphatic: Previously seen tiny perirectal lymph nodes are no longer visualized. No pathologically enlarged lymph nodes identified. No abdominal aortic aneurysm. Reproductive:  No mass or other significant abnormality identified. Other:  None. Musculoskeletal: No suspicious bone lesions identified. Bilateral pars defects again seen, with mild degenerative disc disease and grade 1 anterolisthesis at L5-S1. IMPRESSION: 1. No evidence of recurrent or metastatic carcinoma within the chest, abdomen, or pelvis. 2. Stable 4 mm left lower lobe pulmonary nodule. Recommend continued attention on follow-up imaging. Electronically Signed   By: JMarlaine HindM.D.   On: 01/28/2020 11:45    Medications: I have reviewed the patient's current medications.   Assessment/Plan: 1. Rectal cancer  Colonoscopy  02/15/2019-frond-like/villous, fungating, infiltrative and ulcerated partially obstructing large mass extending from the dentate line into the distal and mid rectum. Mass partially circumferential. Mass measured 9 cm in length. Biopsies showed invasive adenocarcinoma; preserved expression of the major and minor MMR proteins.   Upper endoscopy 02/15/2019-distal esophagitis, esophageal mucosal changes suspicious for long segment Barrett's esophagus. Biopsy of the duodenum showed peptic duodenitis with no dysplasia or malignancy; random gastric biopsy showed mild chronic gastritis and no intestinal metaplasia dysplasia or malignancy. Distal esophagus biopsy showed intestinal metaplasia consistent with Barrett's esophagus and no dysplasia or malignancy.  CTs 02/22/2019-wall thickening of the rectum. A few subcentimeter perirectal lymph nodes. Small bowel to small bowel intussusception within the jejunum which appears to partially resolve on delayed imaging. 2 mm nonspecific pulmonary nodule superior segment left lower lobe.  03/06/2019 lower endoscopic ultrasound-flex impression-malignant partially obstructing tumor in the proximal rectum and in the mid rectum, internal hemorrhoids. EUS impression-rectal mass visualized endosonography, staged T3N1. 1 malignant appearing lymph node was visualized in a peritumoral location. Endosonographic appearance suspicious for metastatic colorectal adenocarcinoma. Internal anal sphincter was visualized and appeared normal. No sign of significant pathology at the rectosigmoid junction and in the sigmoid colon. Prostate noted with calcification within it.  Cycle 1 FOLFOX 03/15/2019  Cycle 2 FOLFOX 03/28/2019  Cycle 3 FOLFOX 04/12/2019  Cycle 4 FOLFOX 04/25/2019  Cycle 5 FOLFOX 05/17/2019, Udenyca added  Cycle 6 FOLFOX 05/31/2019, Udenyca  Cycle 7 FOLFOX 06/14/2019, Udenyca held  Cycle 8 FOLFOX 06/28/2019  Radiation/Xeloda 07/23/2019-08/30/1998  [facility]no evidence of metastatic disease, no rectal mass, decreased presacral lymph nodes  No rectal mass noted on physical examination 09/11/2019   Sigmoidoscopy 10/02/2019-radiation changes in the distal rectum, no mass, minimal ulcer and nodularity at  the anorectal ring, rectal biopsy negative for malignancy  Pelvic MRI 10/08/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 Change in bowel habits secondary to#08 Dec 2018-improved 3. Rectal bleeding secondary to#08 Dec 2018-improved 4. Weight loss 5. Port-A-Cath placement, Dr. Marcello Moores, 03/08/2019, remove 01/24/2020 6. Oxaliplatin neuropathy     Disposition: Mr. Falzone is in clinical remission from rectal cancer.  He will see Dr. Morton Stall in August for a surveillance sigmoidoscopy and pelvic MRI.  He will return for an office visit and CEA in 3 months.  Betsy Coder, MD  01/31/2020  8:31 AM

## 2020-02-01 ENCOUNTER — Telehealth: Payer: Self-pay | Admitting: Oncology

## 2020-02-01 NOTE — Telephone Encounter (Signed)
Scheduled appts per 6/24 los. Pt confirmed appt date and time.  

## 2020-05-05 ENCOUNTER — Other Ambulatory Visit: Payer: Self-pay

## 2020-05-05 ENCOUNTER — Encounter: Payer: Self-pay | Admitting: Nurse Practitioner

## 2020-05-05 ENCOUNTER — Inpatient Hospital Stay: Payer: 59 | Attending: Oncology | Admitting: Nurse Practitioner

## 2020-05-05 ENCOUNTER — Inpatient Hospital Stay: Payer: 59

## 2020-05-05 VITALS — BP 121/84 | HR 96 | Temp 97.9°F | Resp 18 | Ht 68.0 in | Wt 200.4 lb

## 2020-05-05 DIAGNOSIS — R634 Abnormal weight loss: Secondary | ICD-10-CM | POA: Diagnosis not present

## 2020-05-05 DIAGNOSIS — C20 Malignant neoplasm of rectum: Secondary | ICD-10-CM

## 2020-05-05 DIAGNOSIS — K625 Hemorrhage of anus and rectum: Secondary | ICD-10-CM | POA: Insufficient documentation

## 2020-05-05 DIAGNOSIS — T451X5A Adverse effect of antineoplastic and immunosuppressive drugs, initial encounter: Secondary | ICD-10-CM | POA: Diagnosis not present

## 2020-05-05 DIAGNOSIS — Z9221 Personal history of antineoplastic chemotherapy: Secondary | ICD-10-CM | POA: Diagnosis not present

## 2020-05-05 DIAGNOSIS — Z923 Personal history of irradiation: Secondary | ICD-10-CM | POA: Diagnosis not present

## 2020-05-05 DIAGNOSIS — G620 Drug-induced polyneuropathy: Secondary | ICD-10-CM | POA: Insufficient documentation

## 2020-05-05 DIAGNOSIS — Z85048 Personal history of other malignant neoplasm of rectum, rectosigmoid junction, and anus: Secondary | ICD-10-CM | POA: Insufficient documentation

## 2020-05-05 NOTE — Progress Notes (Signed)
[place] OFFICE PROGRESS NOTE   Diagnosis: Rectal cancer  INTERVAL HISTORY:   [name] returns as scheduled.  He feels well.  Bowels moving regularly.  He occasionally notes blood with bowel movements.  No pain.  He has a good appetite.  No fever, cough, shortness of breath.  He has occasional tingling in the fingertips.  Persistent numbness in the toes.  Objective:  Vital signs in last 24 hours:  Blood pressure 121/84, pulse 96, temperature 97.9 F (36.6 C), temperature source Tympanic, resp. rate 18, height 5' 8"  (1.727 m), weight 200 lb 6.4 oz (90.9 kg), SpO2 98 %.    HEENT: Neck without mass. Lymphatics: No palpable cervical, supraclavicular, axillary or inguinal lymph nodes. Resp: Lungs clear bilaterally. Cardio: Regular rate and rhythm. GI: Abdomen soft and nontender.  No hepatomegaly.  No mass. Vascular: No leg edema.   Lab Results:  Lab Results  Component Value Date   WBC 3.4 (L) 10/24/2019   HGB 14.3 10/24/2019   HCT 41.9 10/24/2019   MCV 96.5 10/24/2019   PLT 183 10/24/2019   NEUTROABS 2.5 10/24/2019    Imaging:  No results found.  Medications: I have reviewed the patient's current medications.  Assessment/Plan: 1. Rectal cancer  Colonoscopy 02/15/2019-frond-like/villous, fungating, infiltrative and ulcerated partially obstructing large mass extending from the dentate line into the distal and mid rectum. Mass partially circumferential. Mass measured 9 cm in length. Biopsies showed invasive adenocarcinoma; preserved expression of the major and minor MMR proteins.   Upper endoscopy 02/15/2019-distal esophagitis, esophageal mucosal changes suspicious for long segment Barrett's esophagus. Biopsy of the duodenum showed peptic duodenitis with no dysplasia or malignancy; random gastric biopsy showed mild chronic gastritis and no intestinal metaplasia dysplasia or malignancy. Distal esophagus biopsy showed intestinal metaplasia consistent  with Barrett's esophagus and no dysplasia or malignancy.  CTs 02/22/2019-wall thickening of the rectum. A few subcentimeter perirectal lymph nodes. Small bowel to small bowel intussusception within the jejunum which appears to partially resolve on delayed imaging. 2 mm nonspecific pulmonary nodule superior segment left lower lobe.  03/06/2019 lower endoscopic ultrasound-flex impression-malignant partially obstructing tumor in the proximal rectum and in the mid rectum, internal hemorrhoids. EUS impression-rectal mass visualized endosonography, staged T3N1. 1 malignant appearing lymph node was visualized in a peritumoral location. Endosonographic appearance suspicious for metastatic colorectal adenocarcinoma. Internal anal sphincter was visualized and appeared normal. No sign of significant pathology at the rectosigmoid junction and in the sigmoid colon. Prostate noted with calcification within it.  Cycle 1 FOLFOX 03/15/2019  Cycle 2 FOLFOX 03/28/2019  Cycle 3 FOLFOX 04/12/2019  Cycle 4 FOLFOX 04/25/2019  Cycle 5 FOLFOX 05/17/2019, Udenyca added  Cycle 6 FOLFOX 05/31/2019, Udenyca  Cycle 7 FOLFOX 06/14/2019, Udenyca held  Cycle 8 FOLFOX 06/28/2019  Radiation/Xeloda 07/23/2019-08/30/1998  CTs at [facility] 09/18/2019-no evidence of metastatic disease, no rectal mass, decreased presacral lymph nodes  No rectal mass noted on physical examination 09/11/2019  Sigmoidoscopy 10/02/2019-radiation changes in the distal rectum, no mass, minimal ulcer and nodularity at the anorectal ring,rectal biopsy negative for malignancy  Pelvic MRI 10/08/2019 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  The intersphincteric fat appears unremarkable.  No new suspicious appearing rectal mass.  Nonspecific 4 mm  left external iliac lymph node.  No suspicious perirectal lymph nodes. 2. Change in bowel habits secondary to#08 Dec 2018-improved 3. Rectal bleeding secondary to#08 Dec 2018-improved 4. Weight loss 5. Port-A-Cath placement, Dr. Marcello Moores, 03/08/2019, remove 01/24/2020 6. Oxaliplatin neuropathy   Disposition: Mr. Hustead remains in clinical remission from rectal cancer.  We will follow-up on the CEA from today.  Recent pelvic MRI showed no evidence of recurrent disease.  He reports the flexible sigmoidoscopy has been scheduled for October with Dr. Morton Stall.  He will return for a CEA and follow-up visit here in 3 months.  Plan reviewed with Dr. Benay Spice.    [name] ANP/GNP-BC   05/05/2020  3:27 PM

## 2020-05-06 ENCOUNTER — Telehealth: Payer: Self-pay | Admitting: Nurse Practitioner

## 2020-05-06 LAB — CEA (IN HOUSE-CHCC): CEA (CHCC-In House): 3.06 ng/mL (ref 0.00–5.00)

## 2020-05-06 NOTE — Telephone Encounter (Signed)
Scheduled appointments per 9/27 los. Called patient, no answer. Left message for patient with appointment time and date.

## 2020-08-05 ENCOUNTER — Inpatient Hospital Stay: Payer: PRIVATE HEALTH INSURANCE | Attending: Oncology

## 2020-08-05 ENCOUNTER — Telehealth: Payer: Self-pay

## 2020-08-05 ENCOUNTER — Other Ambulatory Visit: Payer: Self-pay

## 2020-08-05 ENCOUNTER — Inpatient Hospital Stay (HOSPITAL_BASED_OUTPATIENT_CLINIC_OR_DEPARTMENT_OTHER): Payer: PRIVATE HEALTH INSURANCE | Admitting: Oncology

## 2020-08-05 VITALS — BP 129/77 | HR 86 | Temp 97.4°F | Resp 17 | Ht 68.0 in | Wt 204.1 lb

## 2020-08-05 DIAGNOSIS — R634 Abnormal weight loss: Secondary | ICD-10-CM | POA: Diagnosis not present

## 2020-08-05 DIAGNOSIS — C20 Malignant neoplasm of rectum: Secondary | ICD-10-CM

## 2020-08-05 DIAGNOSIS — T451X5A Adverse effect of antineoplastic and immunosuppressive drugs, initial encounter: Secondary | ICD-10-CM | POA: Insufficient documentation

## 2020-08-05 DIAGNOSIS — Z923 Personal history of irradiation: Secondary | ICD-10-CM | POA: Diagnosis not present

## 2020-08-05 DIAGNOSIS — Z9221 Personal history of antineoplastic chemotherapy: Secondary | ICD-10-CM | POA: Insufficient documentation

## 2020-08-05 DIAGNOSIS — G620 Drug-induced polyneuropathy: Secondary | ICD-10-CM | POA: Insufficient documentation

## 2020-08-05 LAB — CEA (IN HOUSE-CHCC): CEA (CHCC-In House): 3.22 ng/mL (ref 0.00–5.00)

## 2020-08-05 NOTE — Telephone Encounter (Signed)
Faxed request to [facility] office of Dr. Durenda Hurt requesting flex sigmoidoscopy report from 06/03/2020 be faxed to Dr. Mancel Bale.  This was faxed to their Medical Records Dept at [phone]

## 2020-08-05 NOTE — Progress Notes (Signed)
[place] OFFICE PROGRESS NOTE   Diagnosis: Rectal cancer  INTERVAL HISTORY:   Jeffrey Day returns for a scheduled visit.  He feels well.  He is working.  He continues to have numbness in the feet.  This does not interfere with activity.  He underwent a sigmoidoscopy with Dr. Morton Stall in October (we do not have the report available today).  He reports he is being scheduled for a colonoscopy with Dr. Morton Stall in February.  Stool frequency has diminished.  He continues to have intermittent episodes of rectal bleeding.  No difficulty with bowel control.  Objective:  Vital signs in last 24 hours:  Blood pressure 129/77, pulse 86, temperature (!) 97.4 F (36.3 C), temperature source Tympanic, resp. rate 17, height 5' 8" (1.727 m), weight 204 lb 1.6 oz (92.6 kg), SpO2 99 %.    Lymphatics: No cervical, supraclavicular, axillary, or inguinal nodes Resp: Lungs clear bilaterally Cardio: Regular rate and rhythm GI: No mass, nontender, no hepatosplenomegaly Vascular: No leg edema   Lab Results:  Lab Results  Component Value Date   WBC 3.4 (L) 10/24/2019   HGB 14.3 10/24/2019   HCT 41.9 10/24/2019   MCV 96.5 10/24/2019   PLT 183 10/24/2019   NEUTROABS 2.5 10/24/2019    CMP  Lab Results  Component Value Date   NA 137 01/25/2020   K 5.1 01/25/2020   CL 103 01/25/2020   CO2 26 01/25/2020   GLUCOSE 139 (H) 01/25/2020   BUN 12 01/25/2020   CREATININE 0.99 01/25/2020   CALCIUM 9.5 01/25/2020   PROT 7.3 10/24/2019   ALBUMIN 4.2 10/24/2019   AST 29 10/24/2019   ALT 30 10/24/2019   ALKPHOS 96 10/24/2019   BILITOT 0.6 10/24/2019   GFRNONAA >60 01/25/2020   GFRAA >60 01/25/2020    Lab Results  Component Value Date   CEA1 3.06 05/05/2020    Medications: I have reviewed the patient's current medications.   Assessment/Plan: 1. Rectal cancer  Colonoscopy 02/15/2019-frond-like/villous, fungating, infiltrative and ulcerated partially obstructing large mass  extending from the dentate line into the distal and mid rectum. Mass partially circumferential. Mass measured 9 cm in length. Biopsies showed invasive adenocarcinoma; preserved expression of the major and minor MMR proteins.   Upper endoscopy 02/15/2019-distal esophagitis, esophageal mucosal changes suspicious for long segment Barrett's esophagus. Biopsy of the duodenum showed peptic duodenitis with no dysplasia or malignancy; random gastric biopsy showed mild chronic gastritis and no intestinal metaplasia dysplasia or malignancy. Distal esophagus biopsy showed intestinal metaplasia consistent with Barrett's esophagus and no dysplasia or malignancy.  CTs 02/22/2019-wall thickening of the rectum. A few subcentimeter perirectal lymph nodes. Small bowel to small bowel intussusception within the jejunum which appears to partially resolve on delayed imaging. 2 mm nonspecific pulmonary nodule superior segment left lower lobe.  03/06/2019 lower endoscopic ultrasound-flex impression-malignant partially obstructing tumor in the proximal rectum and in the mid rectum, internal hemorrhoids. EUS impression-rectal mass visualized endosonography, staged T3N1. 1 malignant appearing lymph node was visualized in a peritumoral location. Endosonographic appearance suspicious for metastatic colorectal adenocarcinoma. Internal anal sphincter was visualized and appeared normal. No sign of significant pathology at the rectosigmoid junction and in the sigmoid colon. Prostate noted with calcification within it.  Cycle 1 FOLFOX 03/15/2019  Cycle 2 FOLFOX 03/28/2019  Cycle 3 FOLFOX 04/12/2019  Cycle 4 FOLFOX 04/25/2019  Cycle 5 FOLFOX 05/17/2019, Udenyca added  Cycle 6 FOLFOX 05/31/2019, Udenyca  Cycle 7 FOLFOX 06/14/2019, Udenyca held  Cycle 8 FOLFOX 06/28/2019  Radiation/Xeloda  07/23/2019-08/30/1998  [facility]-no evidence of metastatic disease, no rectal mass, decreased presacral lymph  nodes  No rectal mass noted on physical examination 09/11/2019  Sigmoidoscopy 10/02/2019-radiation changes in the distal rectum, no mass, minimal ulcer and nodularity at the anorectal ring,rectal biopsy negative for malignancy  Pelvic MRI 10/08/2019 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  The intersphincteric fat appears unremarkable.  No new suspicious appearing rectal mass.  Nonspecific 4 mm left external iliac lymph node.  No suspicious perirectal lymph nodes. 2. Change in bowel habits secondary to#08 Dec 2018-improved 3. Rectal bleeding secondary to#08 Dec 2018-improved 4. Weight loss 5. Port-A-Cath placement, Dr. Marcello Moores, 03/08/2019, remove 01/24/2020 6. Oxaliplatin neuropathy     Disposition: Jeffrey Day is in remission from rectal cancer.  He will continue surveillance sigmoidoscopy and pelvic MRI surveillance with Dr. Morton Stall at [facility].  He is due for a surveillance MRI and endoscopic evaluation in February.  He will be scheduled for restaging CTs and a follow-up visit at the Cancer center in June 2022.    Betsy Coder, MD  08/05/2020  12:21 PM

## 2020-08-06 ENCOUNTER — Telehealth: Payer: Self-pay

## 2020-08-06 ENCOUNTER — Telehealth: Payer: Self-pay | Admitting: Oncology

## 2020-08-06 NOTE — Telephone Encounter (Signed)
Called left message made pt aware of normal lab results encouraged to call for any questions changes or concerns

## 2020-08-06 NOTE — Telephone Encounter (Signed)
-----   Message from Ladene Artist, MD sent at 08/05/2020  5:02 PM EST ----- Please call patient, CEA is normal

## 2020-08-06 NOTE — Telephone Encounter (Signed)
Scheduled appointments per 12/28 los. Mailed updated calendar to patient with appointments date and times.  

## 2020-11-02 IMAGING — CT CT ABDOMEN AND PELVIS WITH CONTRAST
2 of 6 series · 14 of 46 positions shown, 16 images · IV contrast (Omni 300)
Comparison: None.

CLINICAL DATA: Patient with recent diagnosis of rectal carcinoma.
Evaluate for metastasis.

EXAM:
CT CHEST, ABDOMEN, AND PELVIS WITH CONTRAST
TECHNIQUE: Multidetector CT imaging of the chest, abdomen and pelvis was
performed following the standard protocol during bolus
administration of intravenous contrast.
CONTRAST:  100mL OMNIPAQUE IOHEXOL 300 MG/ML  SOLN

[Series 4: cap 1.0 · axial · 0.93mm/px · z∈[+652,+1299]mm · 11 of 891 slices shown, 13 images]
[im 41/891  soft-tissue]
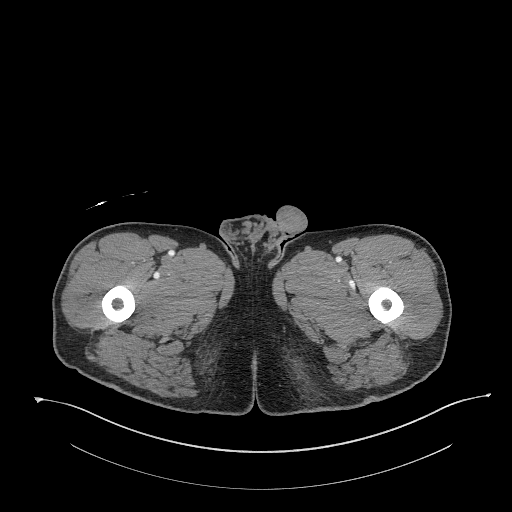
[im 41/891  bone]
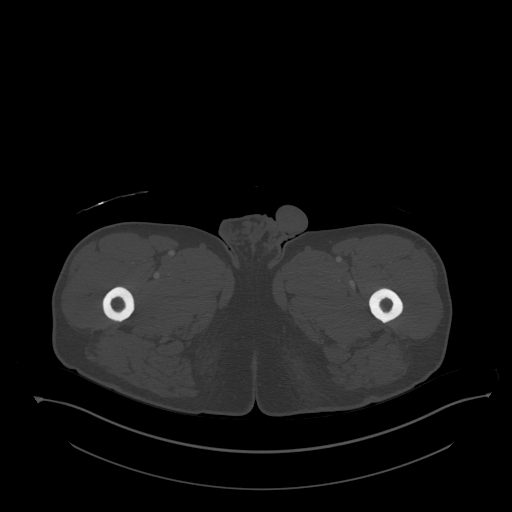
[im 122/891  soft-tissue]
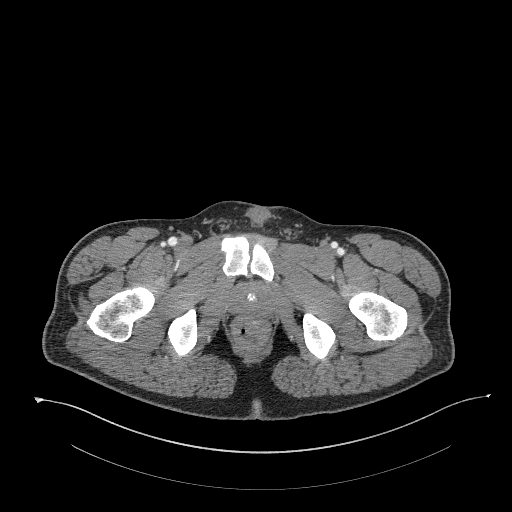
[im 203/891  soft-tissue]
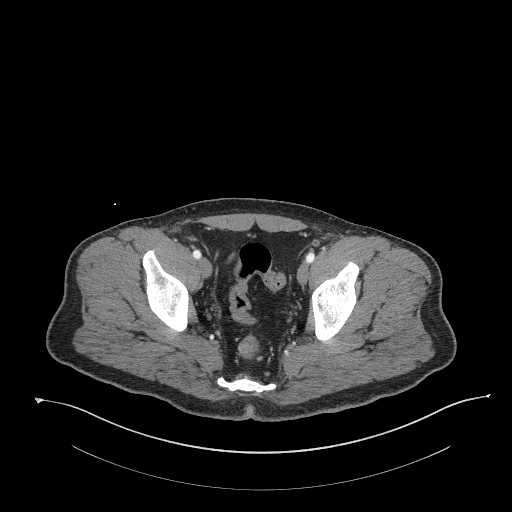
[im 284/891  soft-tissue]
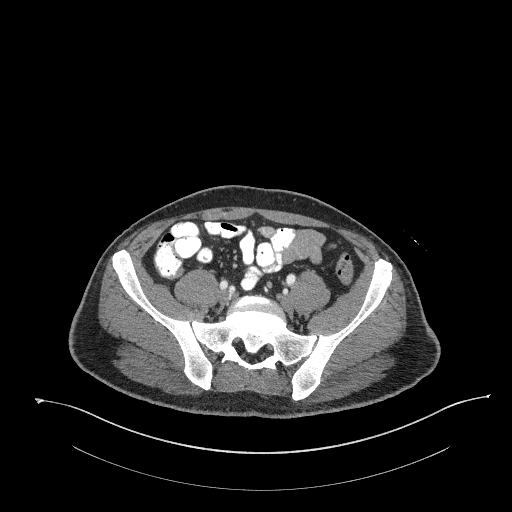
[im 365/891  soft-tissue]
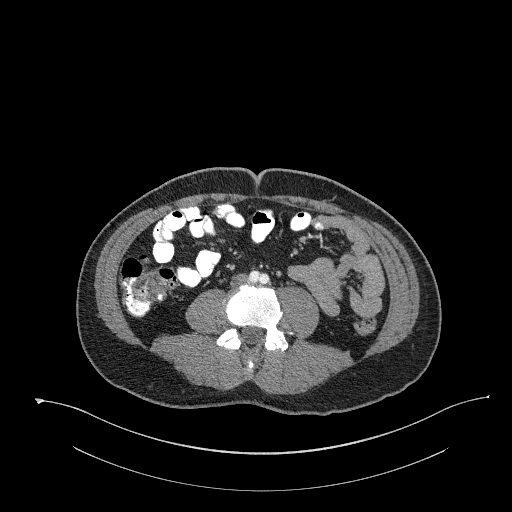
[im 446/891  soft-tissue]
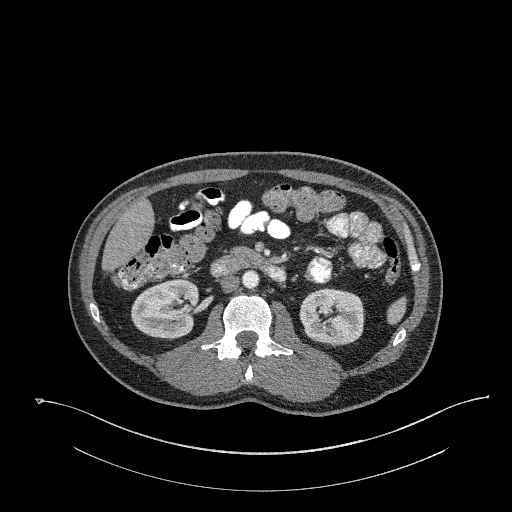
[im 526/891  soft-tissue]
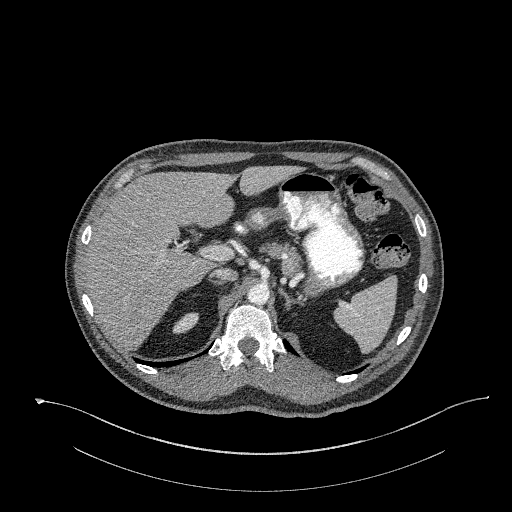
[im 607/891  soft-tissue]
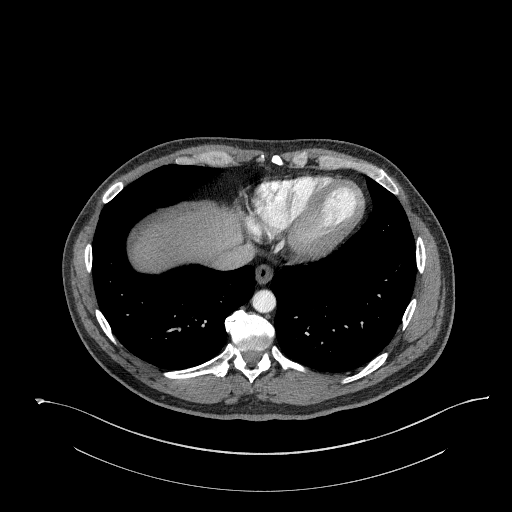
[im 688/891  soft-tissue]
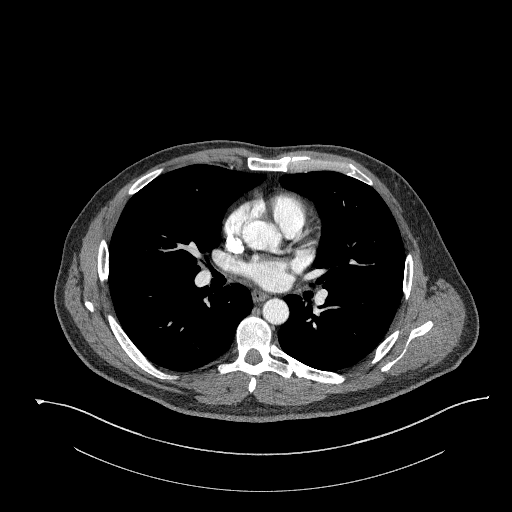
[im 688/891  bone]
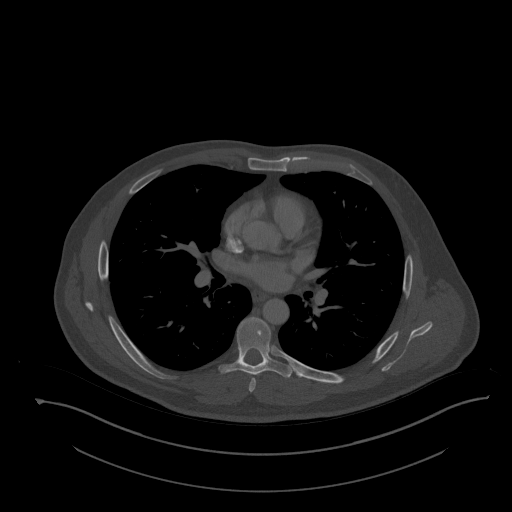
[im 769/891  soft-tissue]
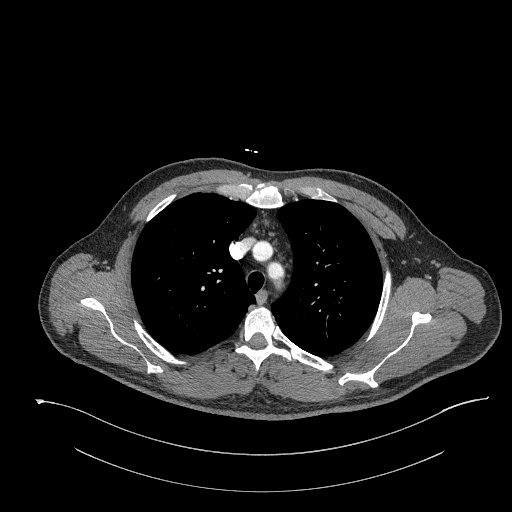
[im 850/891  soft-tissue]
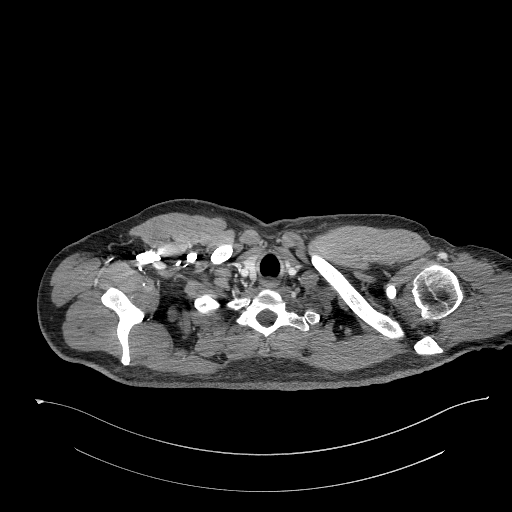

[Series 6: cap with 3mm st cor · coronal · 0.83mm/px · 3 of 169 slices shown]
[im 57/169  soft-tissue]
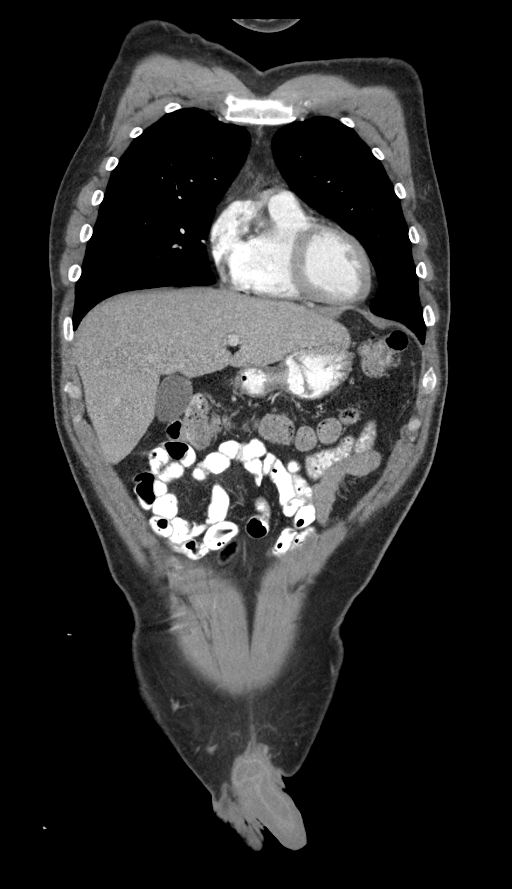
[im 75/169  soft-tissue]
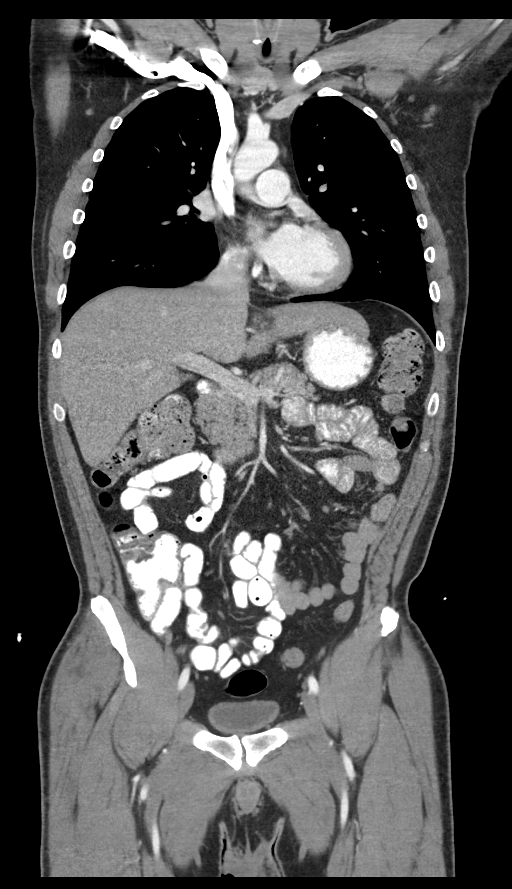
[im 94/169  soft-tissue]
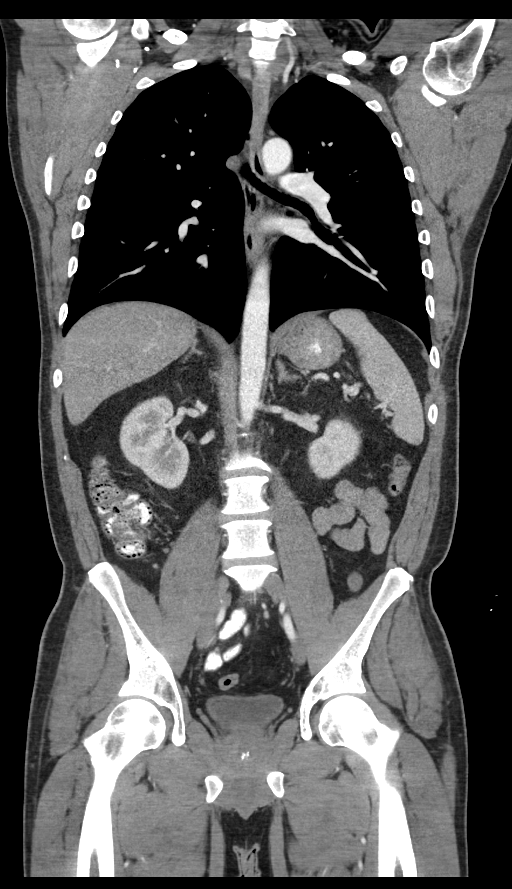

[14 of 46 positions shown; findings below may reference images not displayed]

FINDINGS: CT CHEST FINDINGS

Cardiovascular: Normal heart size. Trace fluid superior pericardial
recess. Aorta and main pulmonary artery are normal in caliber.

Mediastinum/Nodes: No enlarged axillary, mediastinal or hilar
lymphadenopathy. Normal appearance of the esophagus.

Lungs/Pleura: Central airways are patent. No large area pulmonary
consolidation. No pleural effusion or pneumothorax. There is a 2 mm
nodule within the superior segment left lower lobe (image 64; series
5).

Musculoskeletal: Thoracic spine degenerative changes. No aggressive
or acute appearing osseous lesions.

CT ABDOMEN PELVIS FINDINGS

Hepatobiliary: The liver is normal in size and contour. No focal
hepatic lesion is identified. Gallbladder is unremarkable. No
intrahepatic or extrahepatic biliary ductal dilatation.

Pancreas: Unremarkable

Spleen: Unremarkable

Adrenals/Urinary Tract: Normal adrenal glands. Kidneys enhance
symmetrically with contrast. No hydronephrosis. Urinary bladder is
unremarkable.

Stomach/Bowel: There is wall thickening of the rectum. No evidence
for upstream bowel obstruction. No pericolic fat stranding. Normal
appendix. Oral contrast material within the small bowel. There is a
small bowel to small bowel intussusception within the left upper
quadrant (image 68; series 6) which appears to partially resolve on
the delayed images (image 14; series 101). No free fluid or free
intraperitoneal air. Normal morphology of the stomach.

Vascular/Lymphatic: Normal caliber abdominal aorta. No
retroperitoneal lymphadenopathy.There are a few subcentimeter
perirectal lymph nodes measuring up to 6 mm (image 112; series 3).

Reproductive: Heterogeneous prostate.

Other: None.

Musculoskeletal: Bilateral L5 pars defects with grade 1
anterolisthesis of L5 on S1. No aggressive or acute appearing
osseous lesions.
IMPRESSION: There is wall thickening of the rectum compatible with history of
recently diagnosed rectal carcinoma. There are a few subcentimeter
perirectal lymph nodes which are nonspecific. Metastatic disease to
this location is not excluded.

There is a small bowel to small bowel intussusception within the
jejunum which appears to partially resolve on delayed imaging. These
are usually transient in etiology. Given history of malignancy, the
possibility of a malignant process as causative etiology is not
entirely excluded. Consider short-term follow-up exam to assess for
resolution.

Otherwise no evidence for distant metastasis within the chest,
abdomen or pelvis.

There is a 2 mm nonspecific pulmonary nodule superior segment left
lower lobe. Recommend attention on follow-up.

## 2020-11-16 IMAGING — DX PORTABLE CHEST - 1 VIEW
1 series · 1 of 1 positions shown · non-contrast
Comparison: 02/22/2019

CLINICAL DATA: Post op port placement. Hx of rectal ca.

EXAM:
PORTABLE CHEST 1 VIEW

[chest ap]
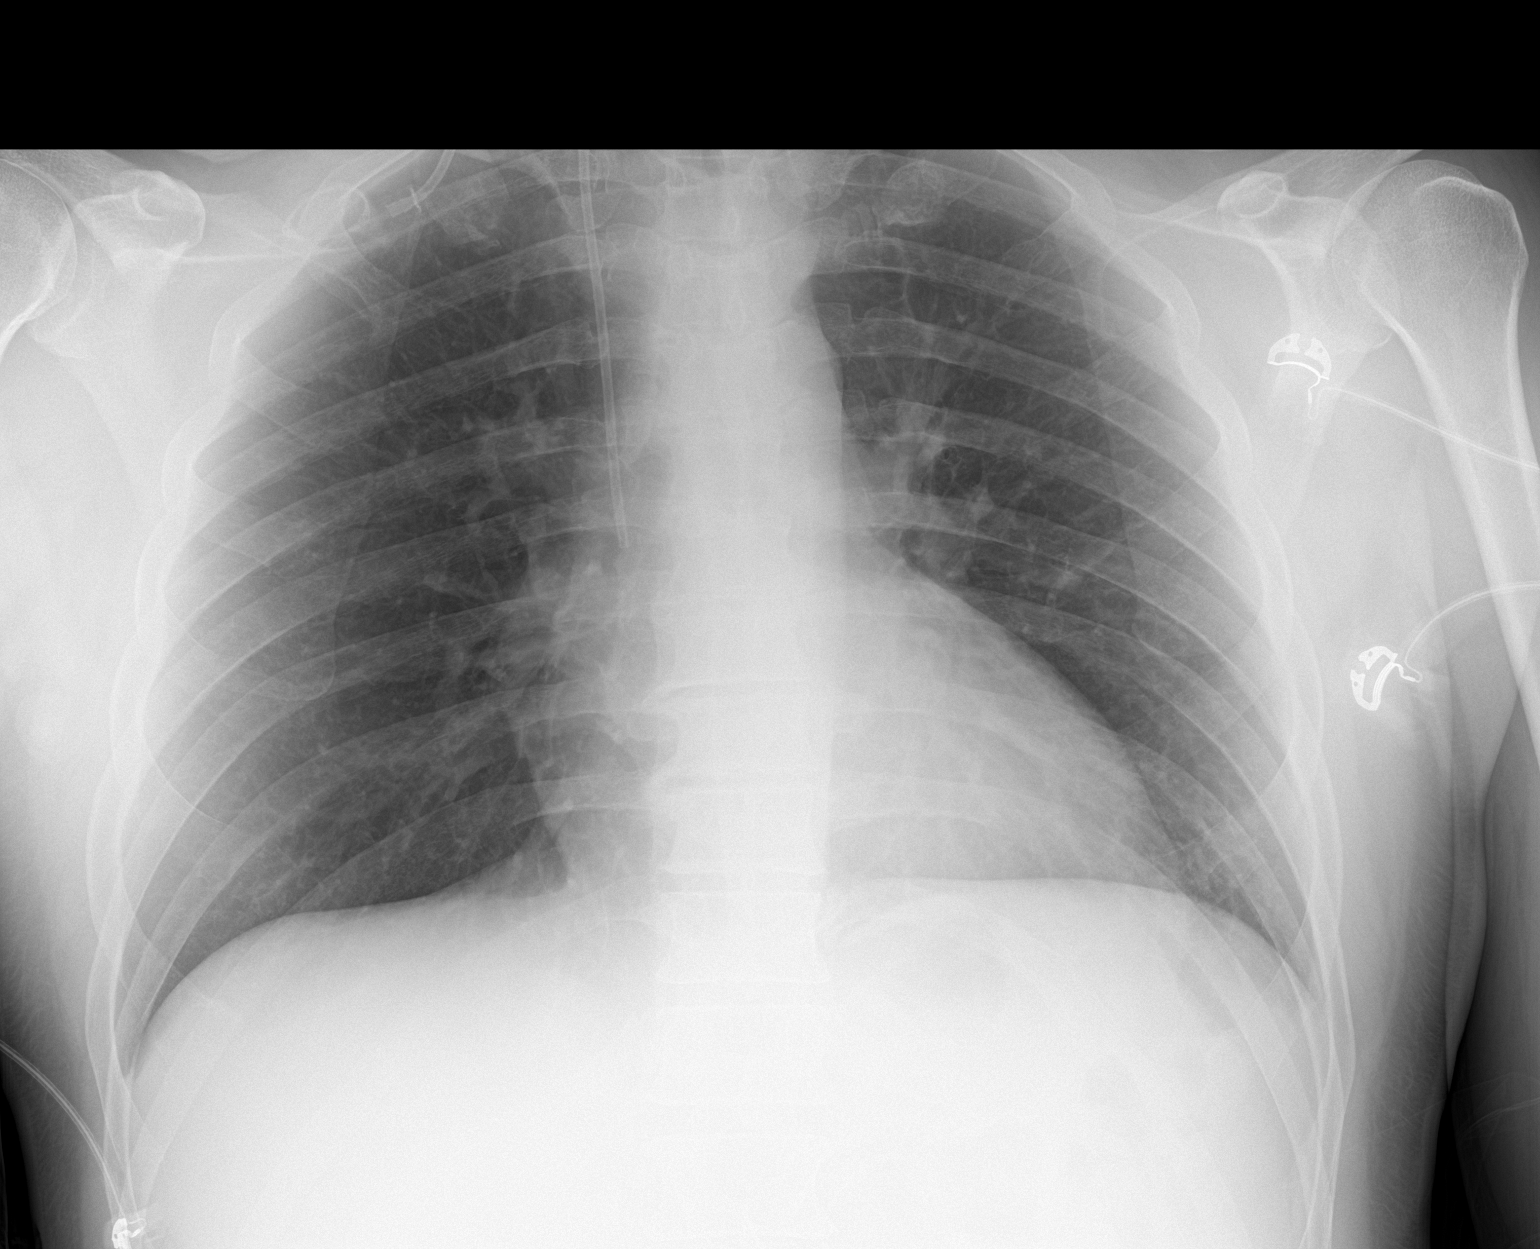

[1 of 1 positions shown; findings below may reference images not displayed]

FINDINGS: Patient has a RIGHT-sided PowerPort, tip overlying the superior vena
cava. No evidence for pneumothorax. Heart size is normal. Lungs are
clear.
IMPRESSION: Interval placement of RIGHT-sided PowerPort, tip overlying the
superior vena cava.

## 2020-12-02 LAB — HM COLONOSCOPY

## 2020-12-04 ENCOUNTER — Encounter: Payer: Self-pay | Admitting: Family Medicine

## 2020-12-16 ENCOUNTER — Encounter: Payer: Self-pay | Admitting: *Deleted

## 2020-12-16 ENCOUNTER — Other Ambulatory Visit: Payer: Self-pay | Admitting: *Deleted

## 2020-12-16 ENCOUNTER — Telehealth: Payer: PRIVATE HEALTH INSURANCE | Admitting: Family Medicine

## 2021-01-08 ENCOUNTER — Inpatient Hospital Stay: Payer: PRIVATE HEALTH INSURANCE

## 2021-01-08 ENCOUNTER — Telehealth: Payer: Self-pay

## 2021-01-08 ENCOUNTER — Inpatient Hospital Stay: Payer: PRIVATE HEALTH INSURANCE | Attending: Nurse Practitioner | Admitting: Nurse Practitioner

## 2021-01-08 ENCOUNTER — Other Ambulatory Visit: Payer: PRIVATE HEALTH INSURANCE

## 2021-01-08 ENCOUNTER — Other Ambulatory Visit: Payer: Self-pay

## 2021-01-08 ENCOUNTER — Encounter (HOSPITAL_BASED_OUTPATIENT_CLINIC_OR_DEPARTMENT_OTHER): Payer: Self-pay

## 2021-01-08 ENCOUNTER — Encounter: Payer: Self-pay | Admitting: Nurse Practitioner

## 2021-01-08 ENCOUNTER — Ambulatory Visit (HOSPITAL_BASED_OUTPATIENT_CLINIC_OR_DEPARTMENT_OTHER)
Admission: RE | Admit: 2021-01-08 | Discharge: 2021-01-08 | Disposition: A | Discharge status: Home / Self Care | Payer: 59 | Source: Ambulatory Visit | Attending: Oncology | Admitting: Oncology

## 2021-01-08 VITALS — BP 142/82 | HR 77 | Temp 98.4°F | Resp 18 | Wt 206.6 lb

## 2021-01-08 DIAGNOSIS — Z923 Personal history of irradiation: Secondary | ICD-10-CM | POA: Insufficient documentation

## 2021-01-08 DIAGNOSIS — G620 Drug-induced polyneuropathy: Secondary | ICD-10-CM | POA: Diagnosis not present

## 2021-01-08 DIAGNOSIS — C20 Malignant neoplasm of rectum: Secondary | ICD-10-CM | POA: Insufficient documentation

## 2021-01-08 DIAGNOSIS — Z9221 Personal history of antineoplastic chemotherapy: Secondary | ICD-10-CM | POA: Diagnosis not present

## 2021-01-08 DIAGNOSIS — T451X5A Adverse effect of antineoplastic and immunosuppressive drugs, initial encounter: Secondary | ICD-10-CM | POA: Insufficient documentation

## 2021-01-08 DIAGNOSIS — R911 Solitary pulmonary nodule: Secondary | ICD-10-CM | POA: Insufficient documentation

## 2021-01-08 DIAGNOSIS — Z85048 Personal history of other malignant neoplasm of rectum, rectosigmoid junction, and anus: Secondary | ICD-10-CM | POA: Diagnosis present

## 2021-01-08 LAB — CEA (ACCESS): CEA (CHCC): 3.9 ng/mL (ref 0.00–5.00)

## 2021-01-08 LAB — BASIC METABOLIC PANEL - CANCER CENTER ONLY
Anion gap: 8 (ref 5–15)
BUN: 11 mg/dL (ref 6–20)
CO2: 27 mmol/L (ref 22–32)
Calcium: 9.5 mg/dL (ref 8.9–10.3)
Chloride: 103 mmol/L (ref 98–111)
Creatinine: 0.98 mg/dL (ref 0.61–1.24)
GFR, Estimated: >60 mL/min (ref >60)
Glucose, Bld: 127 mg/dL — ABNORMAL HIGH (ref 70–99)
Potassium: 4.7 mmol/L (ref 3.5–5.1)
Sodium: 138 mmol/L (ref 135–145)

## 2021-01-08 LAB — CEA (IN HOUSE-CHCC): CEA (CHCC-In House): 4.36 ng/mL (ref 0.00–5.00)

## 2021-01-08 MED ORDER — IOHEXOL 300 MG/ML  SOLN
75.0000 mL | Freq: Once | INTRAMUSCULAR | Status: AC | PRN
  Administered 2021-01-08: 75 mL via INTRAVENOUS

## 2021-01-08 NOTE — Telephone Encounter (Addendum)
Called Dr. Morton Stall message left to return call concerning CT scan orders     Received return call today from Dr. Morton Stall office stating CT /Colonoscopy should be completed every 6 months s/p completed therapy Then every year x 3 years for a total of 5 years id due for CAP for ct scan

## 2021-01-08 NOTE — Telephone Encounter (Signed)
-----   Message from Owens Shark, NP sent at 01/08/2021 12:06 PM EDT ----- Please contact Dr. Morton Stall nurse at [facility].  Mr. Irby indicated that Dr. Morton Stall would like next CT scans sooner than 1 year.  Please find out when Dr. Morton Stall would like to have next CT scans done and I can order.  Thanks

## 2021-01-08 NOTE — Progress Notes (Signed)
[place] OFFICE PROGRESS NOTE   Diagnosis: Rectal cancer  INTERVAL HISTORY:   Jeffrey Day returns as scheduled.  He feels well.  He has a good appetite.  He has fairly persistent numbness in the toes and intermittent numbness in the fingertips.  The numbness does not interfere with activity.  Bowels moving regularly.  No blood or pain with bowel movements.  Objective:  Vital signs in last 24 hours:  There were no vitals taken for this visit.    HEENT: Neck without mass. Lymphatics: No palpable cervical, supraclavicular, axillary or inguinal lymph nodes. Resp: Lungs clear bilaterally. Cardio: Regular rate and rhythm. GI: Abdomen soft and nontender.  No hepatomegaly.  No mass. Vascular: No leg edema.    Lab Results:  Lab Results  Component Value Date   WBC 3.4 (L) 10/24/2019   HGB 14.3 10/24/2019   HCT 41.9 10/24/2019   MCV 96.5 10/24/2019   PLT 183 10/24/2019   NEUTROABS 2.5 10/24/2019    Imaging:  No results found.  Medications: I have reviewed the patient's current medications.  Assessment/Plan: 1. Rectal cancer  Colonoscopy 02/15/2019-frond-like/villous, fungating, infiltrative and ulcerated partially obstructing large mass extending from the dentate line into the distal and mid rectum. Mass partially circumferential. Mass measured 9 cm in length. Biopsies showed invasive adenocarcinoma; preserved expression of the major and minor MMR proteins.   Upper endoscopy 02/15/2019-distal esophagitis, esophageal mucosal changes suspicious for long segment Barrett's esophagus. Biopsy of the duodenum showed peptic duodenitis with no dysplasia or malignancy; random gastric biopsy showed mild chronic gastritis and no intestinal metaplasia dysplasia or malignancy. Distal esophagus biopsy showed intestinal metaplasia consistent with Barrett's esophagus and no dysplasia or malignancy.  CTs 02/22/2019-wall thickening of the rectum. A few subcentimeter  perirectal lymph nodes. Small bowel to small bowel intussusception within the jejunum which appears to partially resolve on delayed imaging. 2 mm nonspecific pulmonary nodule superior segment left lower lobe.  03/06/2019 lower endoscopic ultrasound-flex impression-malignant partially obstructing tumor in the proximal rectum and in the mid rectum, internal hemorrhoids. EUS impression-rectal mass visualized endosonography, staged T3N1. 1 malignant appearing lymph node was visualized in a peritumoral location. Endosonographic appearance suspicious for metastatic colorectal adenocarcinoma. Internal anal sphincter was visualized and appeared normal. No sign of significant pathology at the rectosigmoid junction and in the sigmoid colon. Prostate noted with calcification within it.  Cycle 1 FOLFOX 03/15/2019  Cycle 2 FOLFOX 03/28/2019  Cycle 3 FOLFOX 04/12/2019  Cycle 4 FOLFOX 04/25/2019  Cycle 5 FOLFOX 05/17/2019, Udenyca added  Cycle 6 FOLFOX 05/31/2019, Udenyca  Cycle 7 FOLFOX 06/14/2019, Udenyca held  Cycle 8 FOLFOX 06/28/2019  Radiation/Xeloda 07/23/2019-08/30/1998  CTs at [facility] 09/18/2019-no evidence of metastatic disease, no rectal mass, decreased presacral lymph nodes  No rectal mass noted on physical examination 09/11/2019  Sigmoidoscopy 10/02/2019-radiation changes in the distal rectum, no mass, minimal ulcer and nodularity at the anorectal ring,rectal biopsy negative for malignancy  Pelvic MRI 10/08/2019 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  The intersphincteric fat appears unremarkable.  No new suspicious appearing rectal mass.  Nonspecific 4 mm left  external iliac lymph node.  No suspicious perirectal lymph nodes.  Colonoscopy 12/02/2020-no evidence of progression/site of rectal cancer still with clean,  healed ulcer.  CTs 01/08/2021- completed, report pending at time of office visit 01/08/2021 2. Change in bowel habits secondary to#08 Dec 2018-improved 3. Rectal bleeding secondary to#08 Dec 2018-improved 4. Weight loss 5. Port-A-Cath placement, Dr. Marcello Moores, 03/08/2019, remove 01/24/2020 6. Oxaliplatin neuropathy    Disposition: [name] remains in remission from rectal cancer.  We will follow-up on the CTs from earlier today.  If there are no concerns on CT he will follow-up for CEA and office visit in 6 months.  He continues surveillance MRI and surveillance sigmoidoscopy with Dr[facility]all at [facility].  He will return for lab and follow-up here in 6 months.    [name] ANP/GNP-BC   01/08/2021  12:04 PM

## 2021-01-12 ENCOUNTER — Other Ambulatory Visit: Payer: Self-pay | Admitting: Nurse Practitioner

## 2021-01-12 DIAGNOSIS — C20 Malignant neoplasm of rectum: Secondary | ICD-10-CM

## 2021-01-14 ENCOUNTER — Telehealth: Payer: Self-pay | Admitting: *Deleted

## 2021-01-14 NOTE — Telephone Encounter (Signed)
-----   Message from Owens Shark, NP sent at 01/12/2021  2:45 PM EDT ----- Please let Jeffrey Day know that Dr. Morton Stall recommendation for CT scans every 6 months.  He had CT scans completed on 01/08/2021.  Next CT scans at a 58-month interval.  I have placed the order.  Due to the contrast shortage I had to order without IV contrast but entered a note that our preference is for him to have IV contrast if possible and for radiology to contact us a few days ahead of time so we can adjust the order.

## 2021-01-14 NOTE — Telephone Encounter (Signed)
Per [name], NP, called to notify pt of message below. Left pt voicemail to call office.

## 2021-07-06 ENCOUNTER — Telehealth: Payer: Self-pay | Admitting: Surgery

## 2021-07-06 ENCOUNTER — Other Ambulatory Visit: Payer: Self-pay | Admitting: Surgery

## 2021-07-06 DIAGNOSIS — C20 Malignant neoplasm of rectum: Secondary | ICD-10-CM

## 2021-07-06 NOTE — Telephone Encounter (Signed)
Per [name], NP, I called the pt to let him know that his lab appointment had been rescheduled for tomorrow at 8:30 before his CT at 9:00.  His lab appointment on 07/10/21 is now cancelled.  Pt verbalized understanding of these changes.

## 2021-07-07 ENCOUNTER — Ambulatory Visit (HOSPITAL_BASED_OUTPATIENT_CLINIC_OR_DEPARTMENT_OTHER)
Admission: RE | Admit: 2021-07-07 | Discharge: 2021-07-07 | Disposition: A | Discharge status: Home / Self Care | Payer: No Typology Code available for payment source | Source: Ambulatory Visit | Attending: Nurse Practitioner | Admitting: Nurse Practitioner

## 2021-07-07 ENCOUNTER — Inpatient Hospital Stay: Payer: No Typology Code available for payment source | Attending: Oncology

## 2021-07-07 ENCOUNTER — Encounter (HOSPITAL_BASED_OUTPATIENT_CLINIC_OR_DEPARTMENT_OTHER): Payer: Self-pay

## 2021-07-07 ENCOUNTER — Other Ambulatory Visit: Payer: Self-pay

## 2021-07-07 ENCOUNTER — Ambulatory Visit (HOSPITAL_BASED_OUTPATIENT_CLINIC_OR_DEPARTMENT_OTHER): Admission: RE | Admit: 2021-07-07 | Payer: No Typology Code available for payment source | Source: Ambulatory Visit

## 2021-07-07 DIAGNOSIS — C20 Malignant neoplasm of rectum: Secondary | ICD-10-CM | POA: Diagnosis not present

## 2021-07-07 LAB — BASIC METABOLIC PANEL - CANCER CENTER ONLY
Anion gap: 8 (ref 5–15)
BUN: 8 mg/dL (ref 6–20)
CO2: 28 mmol/L (ref 22–32)
Calcium: 9.5 mg/dL (ref 8.9–10.3)
Chloride: 101 mmol/L (ref 98–111)
Creatinine: 0.9 mg/dL (ref 0.61–1.24)
GFR, Estimated: >60 mL/min (ref >60)
Glucose, Bld: 154 mg/dL — ABNORMAL HIGH (ref 70–99)
Potassium: 4.6 mmol/L (ref 3.5–5.1)
Sodium: 137 mmol/L (ref 135–145)

## 2021-07-07 LAB — CEA (ACCESS): CEA (CHCC): 2.83 ng/mL (ref 0.00–5.00)

## 2021-07-07 MED ORDER — IOHEXOL 300 MG/ML  SOLN
100.0000 mL | Freq: Once | INTRAMUSCULAR | Status: AC | PRN
  Administered 2021-07-07: 100 mL via INTRAVENOUS

## 2021-07-10 ENCOUNTER — Other Ambulatory Visit: Payer: Self-pay

## 2021-07-10 ENCOUNTER — Inpatient Hospital Stay: Payer: No Typology Code available for payment source | Attending: Oncology | Admitting: Oncology

## 2021-07-10 ENCOUNTER — Encounter: Payer: Self-pay | Admitting: Oncology

## 2021-07-10 ENCOUNTER — Inpatient Hospital Stay: Payer: No Typology Code available for payment source

## 2021-07-10 VITALS — BP 112/91 | HR 77 | Temp 97.8°F | Resp 18 | Ht 68.0 in | Wt 211.0 lb

## 2021-07-10 DIAGNOSIS — Z923 Personal history of irradiation: Secondary | ICD-10-CM | POA: Diagnosis not present

## 2021-07-10 DIAGNOSIS — C20 Malignant neoplasm of rectum: Secondary | ICD-10-CM | POA: Diagnosis not present

## 2021-07-10 DIAGNOSIS — G620 Drug-induced polyneuropathy: Secondary | ICD-10-CM | POA: Insufficient documentation

## 2021-07-10 DIAGNOSIS — Z85048 Personal history of other malignant neoplasm of rectum, rectosigmoid junction, and anus: Secondary | ICD-10-CM | POA: Insufficient documentation

## 2021-07-10 DIAGNOSIS — R634 Abnormal weight loss: Secondary | ICD-10-CM | POA: Diagnosis not present

## 2021-07-10 DIAGNOSIS — R200 Anesthesia of skin: Secondary | ICD-10-CM | POA: Diagnosis not present

## 2021-07-10 DIAGNOSIS — R918 Other nonspecific abnormal finding of lung field: Secondary | ICD-10-CM | POA: Diagnosis not present

## 2021-07-10 DIAGNOSIS — K760 Fatty (change of) liver, not elsewhere classified: Secondary | ICD-10-CM | POA: Insufficient documentation

## 2021-07-10 NOTE — Progress Notes (Signed)
[place] OFFICE PROGRESS NOTE   Diagnosis: Rectal cancer  INTERVAL HISTORY:   [name] returns for a scheduled visit.  He feels well.  Mild remaining numbness in the hands and feet.  This does not interfere with activity.  No difficulty with bowel function.  No bleeding.  Objective:  Vital signs in last 24 hours:  Blood pressure (!) 112/91, pulse 77, temperature 97.8 F (36.6 C), temperature source Oral, resp. rate 18, height 5' 8"  (1.727 m), weight 211 lb (95.7 kg), SpO2 100 %.    Lymphatics: No cervical, supraclavicular, axillary, or inguinal nodes Resp: Lungs clear bilaterally Cardio: Regular rate and rhythm GI: No hepatosplenomegaly, nontender, no mass Vascular: No leg edema   Lab Results:  Lab Results  Component Value Date   WBC 3.4 (L) 10/24/2019   HGB 14.3 10/24/2019   HCT 41.9 10/24/2019   MCV 96.5 10/24/2019   PLT 183 10/24/2019   NEUTROABS 2.5 10/24/2019    CMP  Lab Results  Component Value Date   NA 137 07/07/2021   K 4.6 07/07/2021   CL 101 07/07/2021   CO2 28 07/07/2021   GLUCOSE 154 (H) 07/07/2021   BUN 8 07/07/2021   CREATININE 0.90 07/07/2021   CALCIUM 9.5 07/07/2021   PROT 7.3 10/24/2019   ALBUMIN 4.2 10/24/2019   AST 29 10/24/2019   ALT 30 10/24/2019   ALKPHOS 96 10/24/2019   BILITOT 0.6 10/24/2019   GFRNONAA >60 07/07/2021   GFRAA >60 01/25/2020    Lab Results  Component Value Date   CEA1 4.36 01/08/2021   CEA 2.83 07/07/2021    No results found for: INR, LABPROT  Imaging:  CT CHEST ABDOMEN PELVIS W CONTRAST  Result Date: 07/07/2021 CLINICAL DATA:  Follow-up rectal carcinoma. EXAM: CT CHEST, ABDOMEN, AND PELVIS WITH CONTRAST TECHNIQUE: Multidetector CT imaging of the chest, abdomen and pelvis was performed following the standard protocol during bolus administration of intravenous contrast. CONTRAST:  142m OMNIPAQUE IOHEXOL 300 MG/ML  SOLN COMPARISON:  01/08/2021 FINDINGS: CT CHEST FINDINGS Cardiovascular:  No acute findings. Mediastinum/Lymph Nodes: No masses or pathologically enlarged lymph nodes identified. Lungs/Pleura: No suspicious pulmonary nodules or masses identified. A few tiny less than 5 mm left lung nodules remain stable, consistent with benign etiology. No evidence of infiltrate or pleural effusion. Musculoskeletal:  No suspicious bone lesions identified. CT ABDOMEN AND PELVIS FINDINGS Hepatobiliary: No masses identified. Stable hepatic steatosis. Gallbladder is unremarkable. No evidence of biliary ductal dilatation. Pancreas:  No mass or inflammatory changes. Spleen:  Within normal limits in size and appearance. Adrenals/Urinary tract:  No masses or hydronephrosis. Stomach/Bowel: Previously seen mild rectal wall thickening appears decreased. No evidence of obstruction, inflammatory process, or abnormal fluid collections. Normal appendix visualized. Vascular/Lymphatic: No pathologically enlarged lymph nodes identified. No acute vascular findings. Reproductive:  No mass or other significant abnormality identified. Other:  None. Musculoskeletal: No suspicious bone lesions identified. Bilateral L5 pars defects again seen with mild degenerative disc disease and minimal grade 1 anterolisthesis at L5-S1. IMPRESSION: No acute findings. No evidence of recurrent or metastatic carcinoma within the chest, abdomen, or pelvis. Stable hepatic steatosis. Electronically Signed   By: JMarlaine HindM.D.   On: 07/07/2021 14:09    Medications: I have reviewed the patient's current medications.   Assessment/Plan: Rectal cancer Colonoscopy 02/15/2019- frond-like/villous, fungating, infiltrative and ulcerated partially obstructing large mass extending from the dentate line into the distal and mid rectum.  Mass partially circumferential.  Mass measured 9 cm in length.  Biopsies showed invasive adenocarcinoma; preserved expression of the major and minor MMR proteins.   Upper endoscopy 02/15/2019-distal esophagitis, esophageal  mucosal changes suspicious for long segment Barrett's esophagus.  Biopsy of the duodenum showed peptic duodenitis with no dysplasia or malignancy; random gastric biopsy showed mild chronic gastritis and no intestinal metaplasia dysplasia or malignancy. Distal esophagus biopsy showed intestinal metaplasia consistent with Barrett's esophagus and no dysplasia or malignancy. CTs 02/22/2019- wall thickening of the rectum.  A few subcentimeter perirectal lymph nodes.  Small bowel to small bowel intussusception within the jejunum which appears to partially resolve on delayed imaging.  2 mm nonspecific pulmonary nodule superior segment left lower lobe. 03/06/2019 lower endoscopic ultrasound- flex impression-malignant partially obstructing tumor in the proximal rectum and in the mid rectum, internal hemorrhoids.  EUS impression- rectal mass visualized endosonography, staged T3N1.  1 malignant appearing lymph node was visualized in a peritumoral location.  Endosonographic appearance suspicious for metastatic colorectal adenocarcinoma.  Internal anal sphincter was visualized and appeared normal.  No sign of significant pathology at the rectosigmoid junction and in the sigmoid colon.  Prostate noted with calcification within it. Cycle 1 FOLFOX 03/15/2019 Cycle 2 FOLFOX 03/28/2019 Cycle 3 FOLFOX 04/12/2019 Cycle 4 FOLFOX 04/25/2019 Cycle 5 FOLFOX 05/17/2019, Udenyca added Cycle 6 FOLFOX 05/31/2019, Udenyca Cycle 7 FOLFOX 06/14/2019, Udenyca held Cycle 8 FOLFOX 06/28/2019 Radiation/Xeloda 07/23/2019-08/30/1998 CTs at [facility] 09/18/2019-no evidence of metastatic disease, no rectal mass, decreased presacral lymph nodes No rectal mass noted on physical examination 09/11/2019  Sigmoidoscopy 10/02/2019-radiation changes in the distal rectum, no mass, minimal ulcer and nodularity at the anorectal ring, rectal biopsy negative for malignancy Pelvic MRI 10/08/2019 post radiation fibrosis in the lower rectum, nonspecific small right  mesorectal and left external iliac nodes CT 01/28/2020-no evidence of recurrent disease, stable 4 mm left lower lobe nodule MRI pelvis 03/10/2020-similar appearance of T2 hypointense signal involving the right posterior lateral aspect of the lower rectum consistent with postradiation fibrotic changes.  The intersphincteric fat appears unremarkable.  No new suspicious appearing rectal mass.  Nonspecific 4 mm left external iliac lymph node.  No suspicious perirectal lymph nodes. Colonoscopy 12/02/2020-no evidence of progression/site of rectal cancer still with clean, healed ulcer. CTs 01/08/2021-stable rectal wall thickening, stable tiny pulmonary nodules CTs 07/07/2021-stable tiny pulmonary nodules, decreased rectal wall thickening, no evidence of recurrent disease Change in bowel habits secondary to # 08 Dec 2018-improved Rectal bleeding secondary to # 08 Dec 2018-improved Weight loss Port-A-Cath placement, Dr. Marcello Moores, 03/08/2019, remove 01/24/2020 Oxaliplatin neuropathy     Disposition: [name] is in clinical remission from rectal cancer.  He will return for an office visit and surveillance CTs in 6 months.  He missed scheduled follow-up with Dr. Morton Stall in October.  He will reschedule an appointment with Dr. Morton Stall for a surveillance pelvic MRI and sigmoidoscopy.  Betsy Coder, MD  07/10/2021  8:09 AM

## 2021-08-13 ENCOUNTER — Telehealth: Payer: Self-pay | Admitting: *Deleted

## 2021-08-13 NOTE — Telephone Encounter (Signed)
Called patient at request of Dr. Benay Spice, who was asked by Dr. Lenise Arena to reach out to patient to have him call [facility] to get back in with Dr. Morton Stall. Is past due his follow up. Patient acknowledged and he will call.

## 2021-08-27 ENCOUNTER — Ambulatory Visit (INDEPENDENT_AMBULATORY_CARE_PROVIDER_SITE_OTHER): Payer: No Typology Code available for payment source | Admitting: Family Medicine

## 2021-08-27 ENCOUNTER — Encounter: Payer: Self-pay | Admitting: Family Medicine

## 2021-08-27 ENCOUNTER — Other Ambulatory Visit: Payer: Self-pay

## 2021-08-27 VITALS — BP 118/86 | HR 96 | Ht 69.0 in | Wt 215.0 lb

## 2021-08-27 DIAGNOSIS — Z131 Encounter for screening for diabetes mellitus: Secondary | ICD-10-CM

## 2021-08-27 DIAGNOSIS — C20 Malignant neoplasm of rectum: Secondary | ICD-10-CM | POA: Diagnosis not present

## 2021-08-27 DIAGNOSIS — Z125 Encounter for screening for malignant neoplasm of prostate: Secondary | ICD-10-CM

## 2021-08-27 DIAGNOSIS — T451X5A Adverse effect of antineoplastic and immunosuppressive drugs, initial encounter: Secondary | ICD-10-CM

## 2021-08-27 DIAGNOSIS — G620 Drug-induced polyneuropathy: Secondary | ICD-10-CM | POA: Diagnosis not present

## 2021-08-27 DIAGNOSIS — Z0001 Encounter for general adult medical examination with abnormal findings: Secondary | ICD-10-CM | POA: Diagnosis not present

## 2021-08-27 DIAGNOSIS — Z13220 Encounter for screening for lipoid disorders: Secondary | ICD-10-CM

## 2021-08-27 NOTE — Assessment & Plan Note (Addendum)
Follows with oncology.  In remission.

## 2021-08-27 NOTE — Assessment & Plan Note (Signed)
Stable.  Does not wish for further treatment at this time.

## 2021-08-27 NOTE — Progress Notes (Signed)
Chief Complaint:  Jeffrey Day. is a 46 y.o. male who presents today for his annual comprehensive physical exam.    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  Does not wish for further treatment at this time.  Rectal cancer ([place]) in remission Follows with oncology.  In remission.   Body mass index is 31.75 kg/m. / Obese    Preventative Healthcare: Will get blood work done today.  Flu shot declined.  Patient Counseling(The following topics were reviewed and/or handout was given):  -Nutrition: Stressed importance of moderation in sodium/caffeine intake, saturated fat and cholesterol, caloric balance, sufficient intake of fresh fruits, vegetables, and fiber.  -Stressed the importance of regular exercise.   -Substance Abuse: Discussed cessation/primary prevention of tobacco, alcohol, or other drug use; driving or other dangerous activities under the influence; availability of treatment for abuse.   -Injury prevention: Discussed safety belts, safety helmets, smoke detector, smoking near bedding or upholstery.   -Sexuality: Discussed sexually transmitted diseases, partner selection, use of condoms, avoidance of unintended pregnancy and contraceptive alternatives.   -Dental health: Discussed importance of regular tooth brushing, flossing, and dental visits.  -Health maintenance and immunizations reviewed. Please refer to Health maintenance section.  Return to care in 1 year for next preventative visit.     Subjective:  HPI:  He has no acute complaints today.   Since our last he underwent chemotherapy for rectal cancer. His side effects included tingling in feet and cold sensation after eating any cold food. He still have some numbness and tingling in the toes. This is manageable.   Lifestyle Diet: Balanced Exercise: Plays softball 2-3 times a week. Likes to run.   Depression screen PHQ 2/9 08/27/2021   Decreased Interest 0  Down, Depressed, Hopeless 0  PHQ - 2 Score 0    Health Maintenance Due  Topic Date Due   COVID-19 Vaccine (1) Never done   HIV Screening  Never done   Hepatitis C Screening  Never done   TETANUS/TDAP  Never done     ROS: Per HPI, otherwise a complete review of systems was negative.   PMH:  The following were reviewed and entered/updated in epic: Past Medical History:  Diagnosis Date   Allergy    Barrett's esophagus    Rectal cancer [facility])    Patient Active Problem List   Diagnosis Date Noted   Neuropathy due to chemotherapeutic drug ([place]) 08/27/2021   Rectal cancer ([place]) in remission 03/01/2019   Past Surgical History:  Procedure Laterality Date   COLONOSCOPY WITH ESOPHAGOGASTRODUODENOSCOPY (EGD)     biopsy   EUS N/A 03/06/2019   Procedure: LOWER ENDOSCOPIC ULTRASOUND (EUS);  Surgeon: Irving Copas., MD;  Location: [facility];  Service: Gastroenterology;  Laterality: N/A;   FINGER SURGERY  2013   Traumatic Amputation    FLEXIBLE SIGMOIDOSCOPY N/A 03/06/2019   Procedure: FLEXIBLE SIGMOIDOSCOPY;  Surgeon: Rush Landmark Telford Nab., MD;  Location: [facility];  Service: Gastroenterology;  Laterality: N/A;   PORT-A-CATH REMOVAL Right 01/24/2020   Procedure: PAC REMOVAL;  Surgeon: Leighton Ruff, MD;  Location: [facility];  Service: General;  Laterality: Right;   PORTACATH PLACEMENT N/A 03/08/2019   Procedure: INSERTION PORT-A-CATH WITH ULTRASOUND GUIDANCE;  Surgeon: Leighton Ruff, MD;  Location: [facility];  Service: General;  Laterality: N/A;  LMA    Family History  Problem Relation Age of Onset   Lung cancer Paternal Grandmother    Heart disease Maternal Grandmother  Hypertension Neg Hx    Stroke Neg Hx    Colon cancer Neg Hx    Esophageal cancer Neg Hx    Colitis Neg Hx    Inflammatory bowel disease Neg Hx    Liver disease Neg Hx    Pancreatic cancer Neg Hx    Rectal cancer Neg Hx    Stomach cancer Neg Hx      Medications- reviewed and updated Current Outpatient Medications  Medication Sig Dispense Refill   acetaminophen (TYLENOL) 325 MG tablet Take 650 mg by mouth every 6 (six) hours as needed.     No current facility-administered medications for this visit.    Allergies-reviewed and updated Allergies  Allergen Reactions   Peanut Oil Anaphylaxis   Penicillins Other (See Comments)    Childhood allergy Did it involve swelling of the face/tongue/throat, SOB, or low BP? Unknown Did it involve sudden or severe rash/hives, skin peeling, or any reaction on the inside of your mouth or nose? Unknown Did you need to seek medical attention at a hospital or doctor's office? Unknown When did it last happen?      childhood allergy If all above answers are NO, may proceed with cephalosporin use.     Social History   Socioeconomic History   Marital status: Married    Spouse name: Not on file   Number of children: Not on file   Years of education: Not on file   Highest education level: Not on file  Occupational History   Not on file  Tobacco Use   Smoking status: Never   Smokeless tobacco: Current    Types: Chew  Vaping Use   Vaping Use: Never used  Substance and Sexual Activity   Alcohol use: Yes    Comment: occasional   Drug use: No   Sexual activity: Not on file  Other Topics Concern   Not on file  Social History Narrative   Not on file   Social Determinants of Health   Financial Resource Strain: Not on file  Food Insecurity: Not on file  Transportation Needs: Not on file  Physical Activity: Not on file  Stress: Not on file  Social Connections: Not on file        Objective:  Physical Exam: BP 118/86 (BP Location: Left Arm, Patient Position: Sitting)    Pulse 96    Ht 5\' 9"  (1.753 m)    Wt 215 lb (97.5 kg)    SpO2 96%    BMI 31.75 kg/m   Body mass index is 31.75 kg/m. Wt Readings from Last 3 Encounters:  08/27/21 215 lb (97.5 kg)  07/10/21 211 lb (95.7 kg)   01/08/21 206 lb 9.6 oz (93.7 kg)   Gen: NAD, resting comfortably HEENT: TMs normal bilaterally. OP clear. No thyromegaly noted.  CV: RRR with no murmurs appreciated Pulm: NWOB, CTAB with no crackles, wheezes, or rhonchi GI: Normal bowel sounds present. Soft, Nontender, Nondistended. MSK: no edema, cyanosis, or clubbing noted Skin: warm, dry Neuro: CN2-12 grossly intact. Strength 5/5 in upper and lower extremities. Reflexes symmetric and intact bilaterally.  Psych: Normal affect and thought content      I,Savera Zaman,acting as a scribe for Dimas Chyle, MD.,have documented all relevant documentation on the behalf of Dimas Chyle, MD,as directed by  Dimas Chyle, MD while in the presence of Dimas Chyle, MD.   I, Dimas Chyle, MD, have reviewed all documentation for this visit. The documentation on 08/27/21 for the exam, diagnosis, procedures, and orders are  all accurate and complete.  Algis Greenhouse. Jerline Pain, MD 08/27/2021 3:40 PM

## 2021-08-27 NOTE — Patient Instructions (Signed)
It was very nice to see you today!  We will check blood work today.  Please continue working on diet and exercise.  We will see back in 1 year.  Come back sooner if needed.  Take care, Dr Jerline Pain  PLEASE NOTE:  If you had any lab tests please let us know if you have not heard back within a few days. You may see your results on mychart before we have a chance to review them but we will give you a call once they are reviewed by [place]. If we ordered any referrals today, please let us know if you have not heard from their office within the next week.   Please try these tips to maintain a healthy lifestyle:  Eat at least 3 REAL meals and 1-2 snacks per day.  Aim for no more than 5 hours between eating.  If you eat breakfast, please do so within one hour of getting up.   Each meal should contain half fruits/vegetables, one quarter protein, and one quarter carbs (no bigger than a computer mouse)  Cut down on sweet beverages. This includes juice, soda, and sweet tea.   Drink at least 1 glass of water with each meal and aim for at least 8 glasses per day  Exercise at least 150 minutes every week.    Preventive Care 63-44 Years Old, Male Preventive care refers to lifestyle choices and visits with your health care provider that can promote health and wellness. Preventive care visits are also called wellness exams. What can I expect for my preventive care visit? Counseling During your preventive care visit, your health care provider may ask about your: Medical history, including: Past medical problems. Family medical history. Current health, including: Emotional well-being. Home life and relationship well-being. Sexual activity. Lifestyle, including: Alcohol, nicotine or tobacco, and drug use. Access to firearms. Diet, exercise, and sleep habits. Safety issues such as seatbelt and bike helmet use. Sunscreen use. Work and work Statistician. Physical exam Your health care provider will  check your: Height and weight. These may be used to calculate your BMI (body mass index). BMI is a measurement that tells if you are at a healthy weight. Waist circumference. This measures the distance around your waistline. This measurement also tells if you are at a healthy weight and may help predict your risk of certain diseases, such as type 2 diabetes and high blood pressure. Heart rate and blood pressure. Body temperature. Skin for abnormal spots. What immunizations do I need? Vaccines are usually given at various ages, according to a schedule. Your health care provider will recommend vaccines for you based on your age, medical history, and lifestyle or other factors, such as travel or where you work. What tests do I need? Screening Your health care provider may recommend screening tests for certain conditions. This may include: Lipid and cholesterol levels. Diabetes screening. This is done by checking your blood sugar (glucose) after you have not eaten for a while (fasting). Hepatitis B test. Hepatitis C test. HIV (human immunodeficiency virus) test. STI (sexually transmitted infection) testing, if you are at risk. Lung cancer screening. Prostate cancer screening. Colorectal cancer screening. Talk with your health care provider about your test results, treatment options, and if necessary, the need for more tests. Follow these instructions at home: Eating and drinking  Eat a diet that includes fresh fruits and vegetables, whole grains, lean protein, and low-fat dairy products. Take vitamin and mineral supplements as recommended by your health care provider. Do  not drink alcohol if your health care provider tells you not to drink. If you drink alcohol: Limit how much you have to 0-2 drinks a day. Know how much alcohol is in your drink. In the [place] one drink equals one 12 oz bottle of beer (355 mL), one 5 oz glass of wine (148 mL), or one 1 oz glass of hard liquor (44  mL). Lifestyle Brush your teeth every morning and night with fluoride toothpaste. Floss one time each day. Exercise for at least 30 minutes 5 or more days each week. Do not use any products that contain nicotine or tobacco. These products include cigarettes, chewing tobacco, and vaping devices, such as e-cigarettes. If you need help quitting, ask your health care provider. Do not use drugs. If you are sexually active, practice safe sex. Use a condom or other form of protection to prevent STIs. Take aspirin only as told by your health care provider. Make sure that you understand how much to take and what form to take. Work with your health care provider to find out whether it is safe and beneficial for you to take aspirin daily. Find healthy ways to manage stress, such as: Meditation, yoga, or listening to music. Journaling. Talking to a trusted person. Spending time with friends and family. Minimize exposure to UV radiation to reduce your risk of skin cancer. Safety Always wear your seat belt while driving or riding in a vehicle. Do not drive: If you have been drinking alcohol. Do not ride with someone who has been drinking. When you are tired or distracted. While texting. If you have been using any mind-altering substances or drugs. Wear a helmet and other protective equipment during sports activities. If you have firearms in your house, make sure you follow all gun safety procedures. What's next? Go to your health care provider once a year for an annual wellness visit. Ask your health care provider how often you should have your eyes and teeth checked. Stay up to date on all vaccines. This information is not intended to replace advice given to you by your health care provider. Make sure you discuss any questions you have with your health care provider. Document Revised: 01/21/2021 Document Reviewed: 01/21/2021 Elsevier Patient Education  [place].

## 2021-08-28 LAB — LIPID PANEL
Cholesterol: 286 mg/dL — ABNORMAL HIGH (ref 0–200)
HDL: 43.1 mg/dL (ref >39.00)
Total CHOL/HDL Ratio: 7
Triglycerides: 425 mg/dL — ABNORMAL HIGH (ref 0.0–149.0)

## 2021-08-28 LAB — COMPREHENSIVE METABOLIC PANEL
ALT: 48 U/L (ref 0–53)
AST: 27 U/L (ref 0–37)
Albumin: 4.7 g/dL (ref 3.5–5.2)
Alkaline Phosphatase: 83 U/L (ref 39–117)
BUN: 15 mg/dL (ref 6–23)
CO2: 28 mEq/L (ref 19–32)
Calcium: 9.9 mg/dL (ref 8.4–10.5)
Chloride: 100 mEq/L (ref 96–112)
Creatinine, Ser: 0.99 mg/dL (ref 0.40–1.50)
GFR: 91.51 mL/min (ref >60.00)
Glucose, Bld: 93 mg/dL (ref 70–99)
Potassium: 4.5 mEq/L (ref 3.5–5.1)
Sodium: 139 mEq/L (ref 135–145)
Total Bilirubin: 0.5 mg/dL (ref 0.2–1.2)
Total Protein: 7.5 g/dL (ref 6.0–8.3)

## 2021-08-28 LAB — CBC
HCT: 43.4 % (ref 39.0–52.0)
Hemoglobin: 14.7 g/dL (ref 13.0–17.0)
MCHC: 33.9 g/dL (ref 30.0–36.0)
MCV: 90.4 fl (ref 78.0–100.0)
Platelets: 225 10*3/uL (ref 150.0–400.0)
RBC: 4.8 Mil/uL (ref 4.22–5.81)
RDW: 12.8 % (ref 11.5–15.5)
WBC: 5 10*3/uL (ref 4.0–10.5)

## 2021-08-28 LAB — LDL CHOLESTEROL, DIRECT: Direct LDL: 157 mg/dL

## 2021-08-28 LAB — PSA: PSA: 0.68 ng/mL (ref 0.10–4.00)

## 2021-08-28 LAB — HEMOGLOBIN A1C: Hgb A1c MFr Bld: 6.5 % (ref 4.6–6.5)

## 2021-08-28 LAB — TSH: TSH: 1.97 u[IU]/mL (ref 0.35–5.50)

## 2021-08-31 ENCOUNTER — Other Ambulatory Visit: Payer: Self-pay | Admitting: *Deleted

## 2021-08-31 DIAGNOSIS — R739 Hyperglycemia, unspecified: Secondary | ICD-10-CM

## 2021-08-31 MED ORDER — METFORMIN HCL 500 MG PO TABS: 500.0000 mg | ORAL_TABLET | Freq: Every day | ORAL | 3 refills | Status: DC

## 2021-08-31 NOTE — Progress Notes (Signed)
Please inform patient of the following:  His blood sugar and A1c are elevated into the diabetic range.  Recommend starting metformin 500 mg daily to improve numbers.  We should recheck in 3 to 6 months.  He should continue working on cutting out sweets and carbs.  His triglycerides and cholesterol is also high.  We do not need to start medications for this at this point but he should work on diet and exercise and we can recheck in year.  Everything else is NORMAL.  Jeffrey Day. Jerline Pain, MD 08/31/2021 10:26 AM

## 2022-01-08 ENCOUNTER — Ambulatory Visit (HOSPITAL_BASED_OUTPATIENT_CLINIC_OR_DEPARTMENT_OTHER)
Admission: RE | Admit: 2022-01-08 | Discharge: 2022-01-08 | Disposition: A | Discharge status: Home / Self Care | Payer: No Typology Code available for payment source | Source: Ambulatory Visit | Attending: Oncology | Admitting: Oncology

## 2022-01-08 ENCOUNTER — Other Ambulatory Visit: Payer: Self-pay | Admitting: Oncology

## 2022-01-08 ENCOUNTER — Inpatient Hospital Stay: Payer: No Typology Code available for payment source | Attending: Oncology

## 2022-01-08 DIAGNOSIS — Z85048 Personal history of other malignant neoplasm of rectum, rectosigmoid junction, and anus: Secondary | ICD-10-CM | POA: Insufficient documentation

## 2022-01-08 DIAGNOSIS — G629 Polyneuropathy, unspecified: Secondary | ICD-10-CM | POA: Insufficient documentation

## 2022-01-08 DIAGNOSIS — C20 Malignant neoplasm of rectum: Secondary | ICD-10-CM

## 2022-01-08 DIAGNOSIS — R634 Abnormal weight loss: Secondary | ICD-10-CM | POA: Insufficient documentation

## 2022-01-08 DIAGNOSIS — R918 Other nonspecific abnormal finding of lung field: Secondary | ICD-10-CM | POA: Insufficient documentation

## 2022-01-08 DIAGNOSIS — Z923 Personal history of irradiation: Secondary | ICD-10-CM | POA: Insufficient documentation

## 2022-01-08 DIAGNOSIS — T451X5A Adverse effect of antineoplastic and immunosuppressive drugs, initial encounter: Secondary | ICD-10-CM | POA: Insufficient documentation

## 2022-01-08 LAB — BASIC METABOLIC PANEL - CANCER CENTER ONLY
Anion gap: 10 (ref 5–15)
BUN: 11 mg/dL (ref 6–20)
CO2: 29 mmol/L (ref 22–32)
Calcium: 10.2 mg/dL (ref 8.9–10.3)
Chloride: 100 mmol/L (ref 98–111)
Creatinine: 1.02 mg/dL (ref 0.61–1.24)
GFR, Estimated: >60 mL/min (ref >60)
Glucose, Bld: 141 mg/dL — ABNORMAL HIGH (ref 70–99)
Potassium: 5 mmol/L (ref 3.5–5.1)
Sodium: 139 mmol/L (ref 135–145)

## 2022-01-08 LAB — CEA (ACCESS): CEA (CHCC): 3.25 ng/mL (ref 0.00–5.00)

## 2022-01-08 MED ORDER — IOHEXOL 300 MG/ML  SOLN
100.0000 mL | Freq: Once | INTRAMUSCULAR | Status: AC | PRN
  Administered 2022-01-08: 100 mL via INTRAVENOUS

## 2022-01-11 ENCOUNTER — Inpatient Hospital Stay (HOSPITAL_BASED_OUTPATIENT_CLINIC_OR_DEPARTMENT_OTHER): Payer: No Typology Code available for payment source | Admitting: Nurse Practitioner

## 2022-01-11 ENCOUNTER — Encounter: Payer: Self-pay | Admitting: Nurse Practitioner

## 2022-01-11 VITALS — BP 131/99 | HR 56 | Temp 98.4°F | Resp 18 | Ht 69.0 in | Wt 214.2 lb

## 2022-01-11 DIAGNOSIS — Z85048 Personal history of other malignant neoplasm of rectum, rectosigmoid junction, and anus: Secondary | ICD-10-CM | POA: Diagnosis present

## 2022-01-11 DIAGNOSIS — C20 Malignant neoplasm of rectum: Secondary | ICD-10-CM

## 2022-01-11 DIAGNOSIS — R634 Abnormal weight loss: Secondary | ICD-10-CM | POA: Diagnosis not present

## 2022-01-11 DIAGNOSIS — Z923 Personal history of irradiation: Secondary | ICD-10-CM | POA: Diagnosis not present

## 2022-01-11 DIAGNOSIS — T451X5A Adverse effect of antineoplastic and immunosuppressive drugs, initial encounter: Secondary | ICD-10-CM | POA: Diagnosis not present

## 2022-01-11 DIAGNOSIS — R918 Other nonspecific abnormal finding of lung field: Secondary | ICD-10-CM | POA: Diagnosis not present

## 2022-01-11 DIAGNOSIS — G629 Polyneuropathy, unspecified: Secondary | ICD-10-CM | POA: Diagnosis not present

## 2022-01-11 NOTE — Progress Notes (Signed)
[facility] OFFICE PROGRESS NOTE   Diagnosis: Rectal cancer  INTERVAL HISTORY:   Jeffrey Day returns as scheduled.  He feels well.  Bowels moving regularly.  No pain with bowel movements.  He notes occasional blood with bowel movements.  He has a good appetite.  He notes numbness in the toes with cold exposure.  No significant numbness in the hands.  Objective:  Vital signs in last 24 hours:  Blood pressure (!) 131/99, pulse (!) 56, temperature 98.4 F (36.9 C), temperature source Oral, resp. rate 18, height $RemoveBe'5\' 9"'[id number]$  (1.753 m), weight 214 lb 3.2 oz (97.2 kg), SpO2 100 %.    Lymphatics: No palpable cervical, supraclavicular, axillary or inguinal lymph nodes. Resp: Lungs clear bilaterally. Cardio: Regular rate and rhythm. GI: Abdomen soft and nontender.  No hepatosplenomegaly.  No mass. Vascular: No leg edema.   Lab Results:  Lab Results  Component Value Date   WBC 5.0 08/27/2021   HGB 14.7 08/27/2021   HCT 43.4 08/27/2021   MCV 90.4 08/27/2021   PLT 225.0 08/27/2021   NEUTROABS 2.5 10/24/2019    Imaging:  No results found.  Medications: I have reviewed the patient's current medications.  Assessment/Plan: Rectal cancer Colonoscopy 02/15/2019- frond-like/villous, fungating, infiltrative and ulcerated partially obstructing large mass extending from the dentate line into the distal and mid rectum.  Mass partially circumferential.  Mass measured 9 cm in length.  Biopsies showed invasive adenocarcinoma; preserved expression of the major and minor MMR proteins.   Upper endoscopy 02/15/2019-distal esophagitis, esophageal mucosal changes suspicious for long segment Barrett's esophagus.  Biopsy of the duodenum showed peptic duodenitis with no dysplasia or malignancy; random gastric biopsy showed mild chronic gastritis and no intestinal metaplasia dysplasia or malignancy. Distal esophagus biopsy showed intestinal metaplasia consistent with Barrett's esophagus and no  dysplasia or malignancy. CTs 02/22/2019- wall thickening of the rectum.  A few subcentimeter perirectal lymph nodes.  Small bowel to small bowel intussusception within the jejunum which appears to partially resolve on delayed imaging.  2 mm nonspecific pulmonary nodule superior segment left lower lobe. 03/06/2019 lower endoscopic ultrasound- flex impression-malignant partially obstructing tumor in the proximal rectum and in the mid rectum, internal hemorrhoids.  EUS impression- rectal mass visualized endosonography, staged T3N1.  1 malignant appearing lymph node was visualized in a peritumoral location.  Endosonographic appearance suspicious for metastatic colorectal adenocarcinoma.  Internal anal sphincter was visualized and appeared normal.  No sign of significant pathology at the rectosigmoid junction and in the sigmoid colon.  Prostate noted with calcification within it. Cycle 1 FOLFOX 03/15/2019 Cycle 2 FOLFOX 03/28/2019 Cycle 3 FOLFOX 04/12/2019 Cycle 4 FOLFOX 04/25/2019 Cycle 5 FOLFOX 05/17/2019, Udenyca added Cycle 6 FOLFOX 05/31/2019, Udenyca Cycle 7 FOLFOX 06/14/2019, Udenyca held Cycle 8 FOLFOX 06/28/2019 Radiation/Xeloda 07/23/2019-08/30/1998 [facility] at [facility] 09/18/2019-no evidence of metastatic disease, no rectal mass, decreased presacral lymph nodes No rectal mass noted on physical examination 09/11/2019  Sigmoidoscopy 10/02/2019-radiation changes in the distal rectum, no mass, minimal ulcer and nodularity at the anorectal ring, rectal biopsy negative for malignancy Pelvic MRI 10/08/2019 post radiation fibrosis in the lower rectum, nonspecific small right mesorectal and left external iliac nodes CT 01/28/2020-no evidence of recurrent disease, stable 4 mm left lower lobe nodule MRI pelvis 03/10/2020-similar appearance of T2 hypointense signal involving the right posterior lateral aspect of the lower rectum consistent with postradiation fibrotic changes.  The intersphincteric fat appears unremarkable.  No  new suspicious appearing rectal mass.  Nonspecific 4 mm left external iliac lymph  node.  No suspicious perirectal lymph nodes. Colonoscopy 12/02/2020-no evidence of progression/site of rectal cancer still with clean, healed ulcer. CTs 01/08/2021-stable rectal wall thickening, stable tiny pulmonary nodules CTs 07/07/2021-stable tiny pulmonary nodules, decreased rectal wall thickening, no evidence of recurrent disease CTs 01/08/2022-no evidence of recurrent or metastatic carcinoma within the chest, abdomen or pelvis.  Change in bowel habits secondary to # 08 Dec 2018-improved Rectal bleeding secondary to # 08 Dec 2018-improved Weight loss Port-A-Cath placement, Dr. Marcello Moores, 03/08/2019, remove 01/24/2020 Oxaliplatin neuropathy      Disposition: [name] remains in clinical remission from rectal cancer.  Recent CTs show no evidence of recurrent/metastatic disease.  Plan for next surveillance CTs in 6 months.  He plans to reschedule an appointment with Dr. Morton Stall for a sigmoidoscopy and surveillance pelvic MRI.  He will return for an office visit in 6 months, CTs a few days prior.    [name] ANP/GNP-BC   01/11/2022  8:22 AM

## 2022-05-03 ENCOUNTER — Encounter: Payer: Self-pay | Admitting: *Deleted

## 2022-07-13 ENCOUNTER — Inpatient Hospital Stay: Payer: No Typology Code available for payment source

## 2022-07-13 ENCOUNTER — Ambulatory Visit (HOSPITAL_BASED_OUTPATIENT_CLINIC_OR_DEPARTMENT_OTHER): Payer: No Typology Code available for payment source

## 2022-07-15 ENCOUNTER — Inpatient Hospital Stay: Payer: No Typology Code available for payment source | Admitting: Oncology

## 2022-07-22 ENCOUNTER — Encounter: Payer: Self-pay | Admitting: *Deleted

## 2023-02-16 ENCOUNTER — Ambulatory Visit: Payer: No Typology Code available for payment source | Admitting: Family Medicine

## 2023-02-16 ENCOUNTER — Encounter: Payer: Self-pay | Admitting: Family Medicine

## 2023-02-16 VITALS — BP 149/89 | HR 85 | Temp 97.8°F | Ht 69.0 in | Wt 208.4 lb

## 2023-02-16 DIAGNOSIS — R739 Hyperglycemia, unspecified: Secondary | ICD-10-CM

## 2023-02-16 DIAGNOSIS — C20 Malignant neoplasm of rectum: Secondary | ICD-10-CM

## 2023-02-16 DIAGNOSIS — L820 Inflamed seborrheic keratosis: Secondary | ICD-10-CM

## 2023-02-16 DIAGNOSIS — R030 Elevated blood-pressure reading, without diagnosis of hypertension: Secondary | ICD-10-CM

## 2023-02-16 DIAGNOSIS — M25561 Pain in right knee: Secondary | ICD-10-CM | POA: Diagnosis not present

## 2023-02-16 DIAGNOSIS — Z23 Encounter for immunization: Secondary | ICD-10-CM

## 2023-02-16 MED ORDER — DICLOFENAC SODIUM 75 MG PO TBEC: 75.0000 mg | DELAYED_RELEASE_TABLET | Freq: Two times a day (BID) | ORAL | 0 refills | Status: DC

## 2023-02-16 NOTE — Assessment & Plan Note (Signed)
Cryotherapy applied today.  See below procedure note. 

## 2023-02-16 NOTE — Progress Notes (Signed)
   Jeffrey Day. is a 47 y.o. male who presents today for an office visit.  Assessment/Plan:  New/Acute Problems: Right Knee Pain Exam and history consistent with possible meniscal tear.  May have a small amount of underlying degenerative changes as well.  We discussed treatment options.  At this point would be reasonable to have trial of conservative therapy.  We will start diclofenac 75 mg twice daily for the next couple of weeks.  We also discussed home exercise program and handout was given.  He will continue using ice.  Also recommended compression.  He will let us know if not improving in the next 1 to 2 weeks and would consider referral to sports medicine or PT at that point.  Chronic Problems Addressed Today: Hyperglycemia Last A1c 6.5.  He is on metformin 500 mg daily.  He will come back soon for CPE and we can recheck A1c at that point.  Rectal cancer (HCC) in remission In remission.  Follows with oncology.  Inflamed seborrheic keratosis Cryotherapy applied today.  See below procedure note.  Preventative health care Tdap given today.  Advised him to come back soon for CPE.    Subjective:  HPI:  Patient here with right knee pain. Started about 2 weeks. Tried ice and ibuprofen has not helped much. Pain worse with walking for longer distances. Some swelling. Lots of popping.  Pain worse when going from sitting to standing.  Also has lesion on left thigh.  This is been present for months to years.  No significant change recently.  Becomes irritated when changing close.       Objective:  Physical Exam: BP (!) 149/89   Pulse 85   Temp 97.8 F (36.6 C) (Temporal)   Ht 5\' 9"  (1.753 m)   Wt 208 lb 6.4 oz (94.5 kg)   SpO2 98%   BMI 30.78 kg/m   Gen: No acute distress, resting comfortably SKIN: Inflamed keratotic lesion approximately 8 to 10 mm in diameter on left anterior thigh. MUSCULOSKELETAL:  - Right Knee: No deformities.  Mild effusion noted.  Tenderness  palpation along medial joint line.  Stable to varus and valgus stress.  Anterior posterior drawer signs negative.  Pain elicited with McMurray test.  Neurovascular intact distally. Neuro: Grossly normal, moves all extremities Psych: Normal affect and thought content  Cryotherapy Procedure Note  Pre-operative Diagnosis: Inflamed seborrheic keratosis  Locations: Left anterior thigh  Indications: Therapeutic  Procedure Details  Patient informed of risks (permanent scarring, infection, light or dark discoloration, bleeding, infection, weakness, numbness and recurrence of the lesion) and benefits of the procedure and verbal informed consent obtained.  The areas are treated with liquid nitrogen therapy, frozen until ice ball extended 3 mm beyond lesion, allowed to thaw, and treated again. The patient tolerated procedure well.  The patient was instructed on post-op care, warned that there may be blister formation, redness and pain. Recommend OTC analgesia as needed for pain.  Condition: Stable  Complications: none.       Jeffrey Day. Jeffrey Ralph, MD 02/16/2023 7:45 AM

## 2023-02-16 NOTE — Assessment & Plan Note (Signed)
Last A1c 6.5.  He is on metformin 500 mg daily.  He will come back soon for CPE and we can recheck A1c at that point.

## 2023-02-16 NOTE — Assessment & Plan Note (Signed)
In remission Follows with oncology 

## 2023-02-16 NOTE — Patient Instructions (Signed)
It was very nice to see you today!  I think you probably have a torn meniscus.   Please work on the exercises.  Take the diclofenac twice daily for the next couple of weeks. Use ice and compression.  Let us know if not improving in the next 1 to 2 weeks.  We froze the spot on your leg.  Please let us know if this needs to be frozen again.  We gave you a tetanus vaccine today.  Please come back soon for your annual physical.  Return if symptoms worsen or fail to improve.   Take care, Dr [name]  PLEASE NOTE:  If you had any lab tests, please let us know if you have not heard back within a few days. You may see your results on mychart before we have a chance to review them but we will give you a call once they are reviewed by [place].   If we ordered any referrals today, please let us know if you have not heard from their office within the next week.   If you had any urgent prescriptions sent in today, please check with the pharmacy within an hour of our visit to make sure the prescription was transmitted appropriately.   Please try these tips to maintain a healthy lifestyle:  Eat at least 3 REAL meals and 1-2 snacks per day.  Aim for no more than 5 hours between eating.  If you eat breakfast, please do so within one hour of getting up.   Each meal should contain half fruits/vegetables, one quarter protein, and one quarter carbs (no bigger than a computer mouse)  Cut down on sweet beverages. This includes juice, soda, and sweet tea.   Drink at least 1 glass of water with each meal and aim for at least 8 glasses per day  Exercise at least 150 minutes every week.

## 2023-05-12 ENCOUNTER — Ambulatory Visit: Payer: No Typology Code available for payment source | Admitting: Family Medicine

## 2023-05-12 VITALS — BP 122/81 | HR 64 | Temp 97.8°F | Ht 69.0 in | Wt 203.4 lb

## 2023-05-12 DIAGNOSIS — M25561 Pain in right knee: Secondary | ICD-10-CM | POA: Diagnosis not present

## 2023-05-12 DIAGNOSIS — E785 Hyperlipidemia, unspecified: Secondary | ICD-10-CM

## 2023-05-12 DIAGNOSIS — R739 Hyperglycemia, unspecified: Secondary | ICD-10-CM

## 2023-05-12 NOTE — Assessment & Plan Note (Signed)
He will come back soon for CPE.  Recheck lipids at that time.

## 2023-05-12 NOTE — Patient Instructions (Addendum)
It was very nice to see you today!  We will refer you to orthopedics for your knee.  Return in about 3 months (around 08/12/2023) for Annual Physical.   Take care, Dr [name]  PLEASE NOTE:  If you had any lab tests, please let us know if you have not heard back within a few days. You may see your results on mychart before we have a chance to review them but we will give you a call once they are reviewed by [place].   If we ordered any referrals today, please let us know if you have not heard from their office within the next week.   If you had any urgent prescriptions sent in today, please check with the pharmacy within an hour of our visit to make sure the prescription was transmitted appropriately.   Please try these tips to maintain a healthy lifestyle:  Eat at least 3 REAL meals and 1-2 snacks per day.  Aim for no more than 5 hours between eating.  If you eat breakfast, please do so within one hour of getting up.   Each meal should contain half fruits/vegetables, one quarter protein, and one quarter carbs (no bigger than a computer mouse)  Cut down on sweet beverages. This includes juice, soda, and sweet tea.   Drink at least 1 glass of water with each meal and aim for at least 8 glasses per day  Exercise at least 150 minutes every week.

## 2023-05-12 NOTE — Progress Notes (Signed)
   Jeffrey Day. is a 47 y.o. male who presents today for an office visit.  Assessment/Plan:  New/Acute Problems: Right knee pain Concern for possible meniscal tear given his mechanical symptoms.  Has not had any significant improvement with conservative therapy over the last several months.  Will place referral to orthopedics.  Likely needs advanced imaging with MRI for definitive diagnosis.  He can continue with ice and compression.  Discussed another trial of anti-inflammatories however he declined.  Chronic Problems Addressed Today: Hyperglycemia No longer on metformin.  Advised patient to come in soon for CPE so that we can recheck A1c.  Dyslipidemia He will come back soon for CPE.  Recheck lipids at that time.     Subjective:  HPI:  See Assessment / plan for status of chronic conditions. Patient here with right knee pain.  We initially saw him for this about 3 months ago.  At that time pain has been going on for a couple of weeks.  We initiated conservative therapy with home exercise program, ice, compression, and diclofenac 75 mg twice daily.  He does not think that the diclofenac has helped much.  Has been working on home exercises and continuing with ice and compression however symptoms have persisted.  Still does have occasional mechanical symptoms including locking, popping, and leg giving out.  He has not noticed any obvious aggravating or alleviating factors.  He has some good days and some bad days.   He also did have COVID about a month or so ago.  Was sick for several days with fever and congestion.  Was given a prescription for paxlovid at urgent care however had to discontinue due to diarrhea.  Symptoms resolved and is now back to baseline.       Objective:  Physical Exam: BP 122/81   Pulse 64   Temp 97.8 F (36.6 C) (Temporal)   Ht 5\' 9"  (1.753 m)   Wt 203 lb 6.4 oz (92.3 kg)   SpO2 99%   BMI 30.04 kg/m   Gen: No acute distress, resting  comfortably CV: Regular rate and rhythm with no murmurs appreciated Pulm: Normal work of breathing, clear to auscultation bilaterally with no crackles, wheezes, or rhonchi MUSCULOSKELETAL: - Right Knee: No deformities.  No effusion.  Tenderness palpation along medial joint line.  Stable to varus and valgus stress.  Anterior posterior drawer signs negative.  McMurry test negative.  Neurovascular intact distally. Neuro: Grossly normal, moves all extremities Psych: Normal affect and thought content      Aarit Kashuba M. [name], MD 05/12/2023 7:42 AM

## 2023-05-12 NOTE — Assessment & Plan Note (Signed)
No longer on metformin.  Advised patient to come in soon for CPE so that we can recheck A1c.

## 2023-05-13 ENCOUNTER — Ambulatory Visit: Payer: No Typology Code available for payment source | Admitting: Physician Assistant

## 2023-05-13 ENCOUNTER — Other Ambulatory Visit (INDEPENDENT_AMBULATORY_CARE_PROVIDER_SITE_OTHER): Payer: No Typology Code available for payment source

## 2023-05-13 ENCOUNTER — Encounter: Payer: Self-pay | Admitting: Physician Assistant

## 2023-05-13 DIAGNOSIS — M25561 Pain in right knee: Secondary | ICD-10-CM | POA: Insufficient documentation

## 2023-05-13 NOTE — Progress Notes (Signed)
Office Visit Note   Patient: Jeffrey Day.           Date of Birth: 06-14-1976           MRN: [id number] Visit Date: 05/13/2023              Requested by: Ardith Dark, MD [address] [place],  [place] [address] PCP: Ardith Dark, MD   Assessment & Plan: Visit Diagnoses:  1. Right knee pain, unspecified chronicity     Plan: Pleasant 47 year old gentleman with a 7-week history of right medial knee pain.  Denies any injury but does play quite a bit of softball.  He gets episodic mechanical symptoms including catching popping and locking.  He has failed conservative treatment of anti-inflammatories and rest and still continues to have mechanical symptoms.  He does have some varus alignment.  His x-rays demonstrate very early early degenerative changes with some varus alignment.  I recommend an MRI to rule out meniscal pathology I will call him with results or refer him to one of our knee specialis  Follow-Up Instructions: After MRI  Orders:  No orders of the defined types were placed in this encounter.  No orders of the defined types were placed in this encounter.     Procedures: No procedures performed   Clinical Data: No additional findings.   Subjective: Chief Complaint  Patient presents with   Right Knee - Pain    HPI Patient is a pleasant 47 year old gentleman with a 7-week history of right medial knee pain.  Hurts intermittently but is sharp moderately painful.  Denies any particular injury but does play quite a bit of softball reports moderate symptoms with locking and catching Review of Systems  All other systems reviewed and are negative.    Objective: Vital Signs: There were no vitals taken for this visit.  Physical Exam Constitutional:      Appearance: Normal appearance.  Pulmonary:     Effort: Pulmonary effort is normal.  Skin:    General: Skin is warm and dry.  Neurological:     Mental Status: He is alert.  Psychiatric:         Mood and Affect: Mood normal.        Behavior: Behavior normal.     Ortho Exam Exam of his right knee he has no erythema or effusion compartments are soft and compressible he has good dorsiflexion plantarflexion has acute tenderness over the posterior medial joint line with terminal extension and flexion good ligamentous stability  Specialty Comments:  No specialty comments available.  Imaging: No results found.   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   Rectal cancer (HCC) in remission 03/01/2019   Past Medical History:  Diagnosis Date   Allergy    Barrett's esophagus    Rectal cancer (HCC)     Family History  Problem Relation Age of Onset   Lung cancer Paternal Grandmother    Heart disease Maternal Grandmother    Hypertension Neg Hx    Stroke Neg Hx    Colon cancer Neg Hx    Esophageal cancer Neg Hx    Colitis Neg Hx    Inflammatory bowel disease Neg Hx    Liver disease Neg Hx    Pancreatic cancer Neg Hx  Rectal cancer Neg Hx    Stomach cancer Neg Hx     Past Surgical History:  Procedure Laterality Date   COLONOSCOPY WITH ESOPHAGOGASTRODUODENOSCOPY (EGD)     biopsy   EUS N/A 03/06/2019   Procedure: LOWER ENDOSCOPIC ULTRASOUND (EUS);  Surgeon: Lemar Lofty., MD;  Location: [facility] ENDOSCOPY;  Service: Gastroenterology;  Laterality: N/A;   FINGER SURGERY  2013   Traumatic Amputation    FLEXIBLE SIGMOIDOSCOPY N/A 03/06/2019   Procedure: FLEXIBLE SIGMOIDOSCOPY;  Surgeon: Meridee Score Netty Starring., MD;  Location: [facility] ENDOSCOPY;  Service: Gastroenterology;  Laterality: N/A;   PORT-A-CATH REMOVAL Right 01/24/2020   Procedure: PAC REMOVAL;  Surgeon: Romie Levee, MD;  Location: [facility];  Service: General;  Laterality: Right;    PORTACATH PLACEMENT N/A 03/08/2019   Procedure: INSERTION PORT-A-CATH WITH ULTRASOUND GUIDANCE;  Surgeon: Romie Levee, MD;  Location: [facility];  Service: General;  Laterality: N/A;  LMA   Social History   Occupational History   Not on file  Tobacco Use   Smoking status: Never   Smokeless tobacco: Current    Types: Chew  Vaping Use   Vaping status: Never Used  Substance and Sexual Activity   Alcohol use: Yes    Comment: occasional   Drug use: No   Sexual activity: Not on file

## 2023-06-23 ENCOUNTER — Ambulatory Visit
Admission: RE | Admit: 2023-06-23 | Discharge: 2023-06-23 | Disposition: A | Discharge status: Home / Self Care | Payer: No Typology Code available for payment source | Source: Ambulatory Visit | Attending: Physician Assistant | Admitting: Physician Assistant

## 2023-06-23 DIAGNOSIS — M25561 Pain in right knee: Secondary | ICD-10-CM

## 2023-07-06 ENCOUNTER — Encounter: Payer: Self-pay | Admitting: Family Medicine

## 2023-07-11 NOTE — Telephone Encounter (Signed)
Results not in at this time 

## 2023-07-14 ENCOUNTER — Other Ambulatory Visit: Payer: Self-pay | Admitting: Physician Assistant

## 2023-07-14 DIAGNOSIS — M25561 Pain in right knee: Secondary | ICD-10-CM

## 2023-07-26 ENCOUNTER — Encounter: Payer: Self-pay | Admitting: Orthopaedic Surgery

## 2023-07-26 ENCOUNTER — Ambulatory Visit (INDEPENDENT_AMBULATORY_CARE_PROVIDER_SITE_OTHER): Payer: No Typology Code available for payment source | Admitting: Orthopaedic Surgery

## 2023-07-26 DIAGNOSIS — S83241A Other tear of medial meniscus, current injury, right knee, initial encounter: Secondary | ICD-10-CM | POA: Diagnosis not present

## 2023-07-26 NOTE — Progress Notes (Signed)
Office Visit Note   Patient: Jeffrey Day.           Date of Birth: Dec 07, 1975           MRN: [id number] Visit Date: 07/26/2023              Requested by: Persons, [place], [place] [address] [place],  [place] [address] PCP: Ardith Dark, MD   Assessment & Plan: Visit Diagnoses:  1. Acute medial meniscus tear of right knee, initial encounter     Plan: Ransom is a very pleasant 47 year old gentleman with symptomatic medial meniscus tear.  Reporting mechanical symptoms of locking and catching.  Does a physical job and leads a very active lifestyle.  Based on MRI findings I have recommended arthroscopic knee surgery and medial meniscal debridement.  Risk benefits prognosis reviewed.  He will call Debbie to schedule surgery when he is ready.  Follow-Up Instructions: No follow-ups on file.   Orders:  No orders of the defined types were placed in this encounter.  No orders of the defined types were placed in this encounter.     Procedures: No procedures performed   Clinical Data: No additional findings.   Subjective: Chief Complaint  Patient presents with   Right Knee - Pain    HPI Jeffrey Day is a very pleasant 47 year old gentleman referral from [facility] for surgical consultation for acute medial meniscal tear.  He has had symptoms of locking and catching for 6 months.  He plays softball tournaments on the weekends.  He does a physical job as well.  Due to MRI findings of a medial meniscal tear and lack of relief from conservative treatment he was referred to me for surgical consultation.  Review of Systems  Constitutional: Negative.   HENT: Negative.    Eyes: Negative.   Respiratory: Negative.    Cardiovascular: Negative.   Gastrointestinal: Negative.   Endocrine: Negative.   Genitourinary: Negative.   Musculoskeletal:  Positive for arthralgias and joint swelling.  Skin: Negative.   Allergic/Immunologic: Negative.   Neurological: Negative.    Hematological: Negative.   Psychiatric/Behavioral: Negative.    All other systems reviewed and are negative.    Objective: Vital Signs: There were no vitals taken for this visit.  Physical Exam Vitals and nursing note reviewed.  Constitutional:      Appearance: He is well-developed.  Pulmonary:     Effort: Pulmonary effort is normal.  Abdominal:     Palpations: Abdomen is soft.  Musculoskeletal:     Right knee:     Instability Tests: Medial McMurray test positive.  Skin:    General: Skin is warm.  Neurological:     Mental Status: He is alert and oriented to person, place, and time.  Psychiatric:        Behavior: Behavior normal.        Thought Content: Thought content normal.        Judgment: Judgment normal.     Right Knee Exam   Tenderness  The patient is experiencing tenderness in the medial joint line and medial retinaculum.  Range of Motion  The patient has normal right knee ROM.  Tests  McMurray:  Medial - positive       Specialty Comments:  No specialty comments available.  Imaging: No results found.   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   Rectal cancer (HCC) in remission 03/01/2019   Past Medical History:  Diagnosis Date   Allergy    Barrett's esophagus    Rectal cancer (HCC)     Family History  Problem Relation Age of Onset   Lung cancer Paternal Grandmother    Heart disease Maternal Grandmother    Hypertension Neg Hx    Stroke Neg Hx    Colon cancer Neg Hx    Esophageal cancer Neg Hx    Colitis Neg Hx    Inflammatory bowel disease Neg Hx    Liver disease Neg Hx    Pancreatic cancer Neg Hx    Rectal cancer Neg Hx    Stomach cancer Neg Hx     Past Surgical  History:  Procedure Laterality Date   COLONOSCOPY WITH ESOPHAGOGASTRODUODENOSCOPY (EGD)     biopsy   EUS N/A 03/06/2019   Procedure: LOWER ENDOSCOPIC ULTRASOUND (EUS);  Surgeon: Lemar Lofty., MD;  Location: [facility] ENDOSCOPY;  Service: Gastroenterology;  Laterality: N/A;   FINGER SURGERY  2013   Traumatic Amputation    FLEXIBLE SIGMOIDOSCOPY N/A 03/06/2019   Procedure: FLEXIBLE SIGMOIDOSCOPY;  Surgeon: Meridee Score Netty Starring., MD;  Location: [facility] ENDOSCOPY;  Service: Gastroenterology;  Laterality: N/A;   PORT-A-CATH REMOVAL Right 01/24/2020   Procedure: PAC REMOVAL;  Surgeon: Romie Levee, MD;  Location: [facility];  Service: General;  Laterality: Right;   PORTACATH PLACEMENT N/A 03/08/2019   Procedure: INSERTION PORT-A-CATH WITH ULTRASOUND GUIDANCE;  Surgeon: Romie Levee, MD;  Location: [facility];  Service: General;  Laterality: N/A;  LMA   Social History   Occupational History   Not on file  Tobacco Use   Smoking status: Never   Smokeless tobacco: Current    Types: Chew  Vaping Use   Vaping status: Never Used  Substance and Sexual Activity   Alcohol use: Yes    Comment: occasional   Drug use: No   Sexual activity: Not on file

## 2023-09-19 IMAGING — CT CT CHEST-ABD-PELV W/ CM
2 of 5 series · 15 of 46 positions shown, 17 images · IV contrast (APPLIED)
Comparison: 07/07/2021

CLINICAL DATA: Follow-up metastatic rectal carcinoma. Surveillance.

EXAM:
CT CHEST, ABDOMEN, AND PELVIS WITH CONTRAST
TECHNIQUE: Multidetector CT imaging of the chest, abdomen and pelvis was
performed following the standard protocol during bolus
administration of intravenous contrast.

[Series 2: cap with · axial · 0.89mm/px · z∈[-781,-176]mm · 12 of 143 slices shown, 14 images]
[im 11/143  soft-tissue]
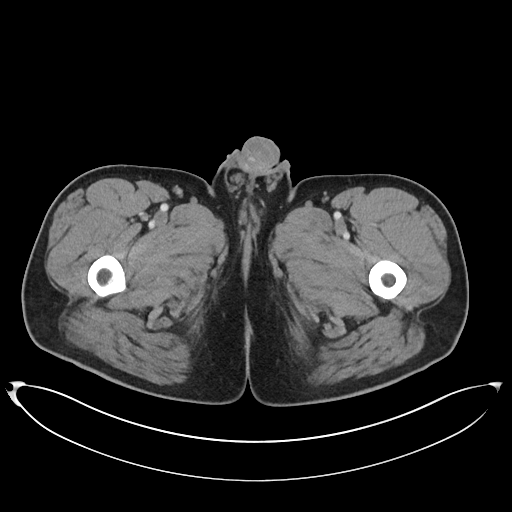
[im 11/143  bone]
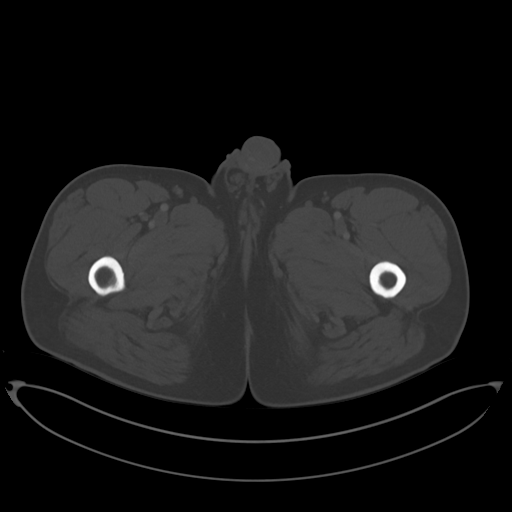
[im 22/143  soft-tissue]
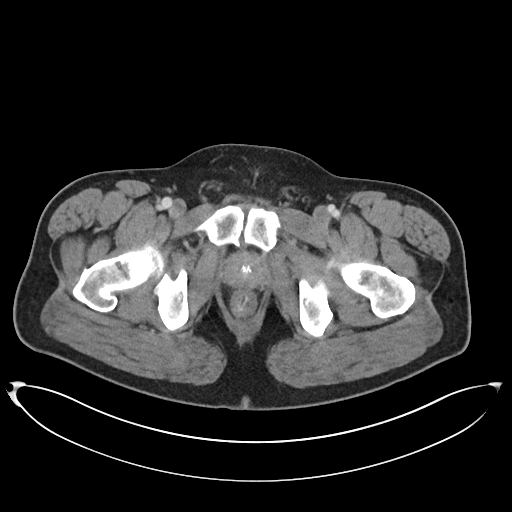
[im 33/143  soft-tissue]
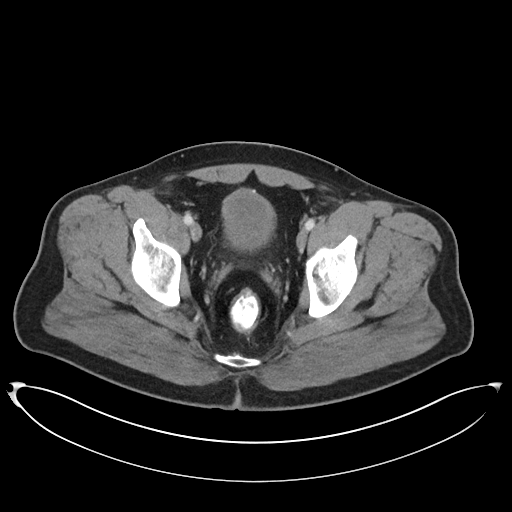
[im 44/143  soft-tissue]
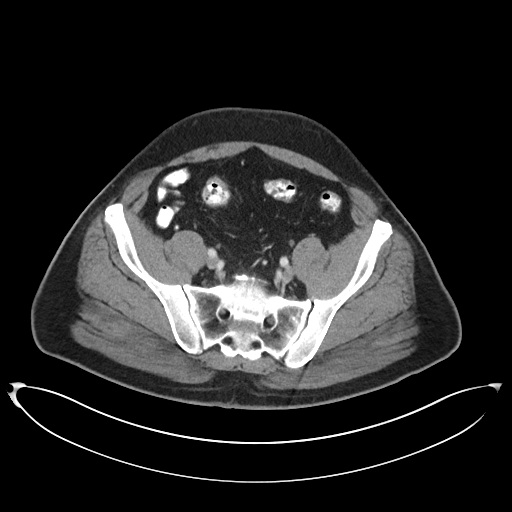
[im 55/143  soft-tissue]
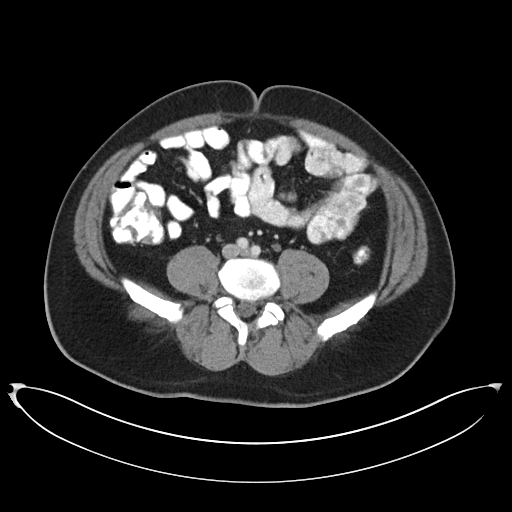
[im 66/143  soft-tissue]
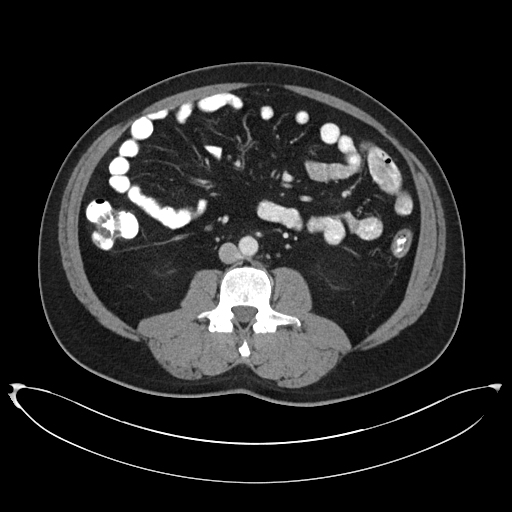
[im 77/143  soft-tissue]
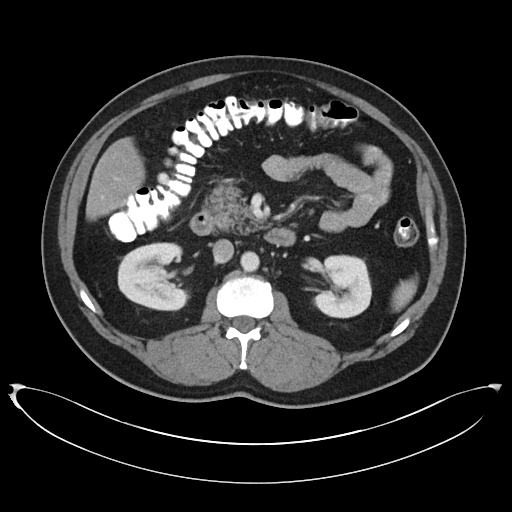
[im 88/143  soft-tissue]
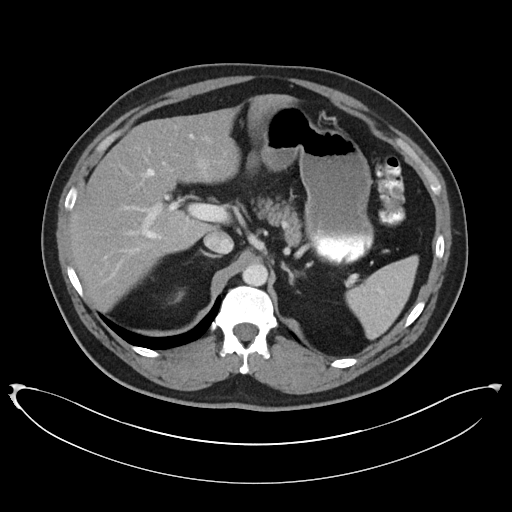
[im 99/143  soft-tissue]
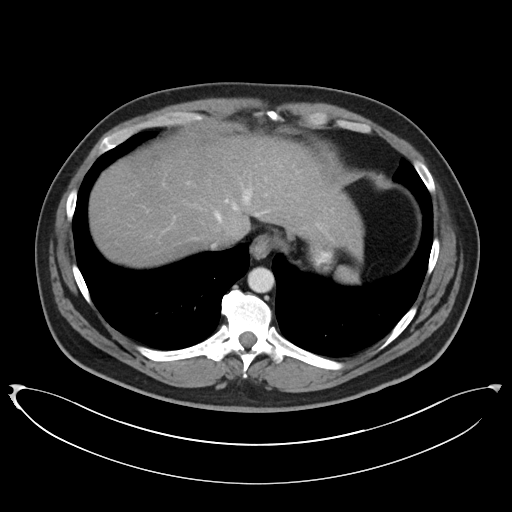
[im 99/143  bone]
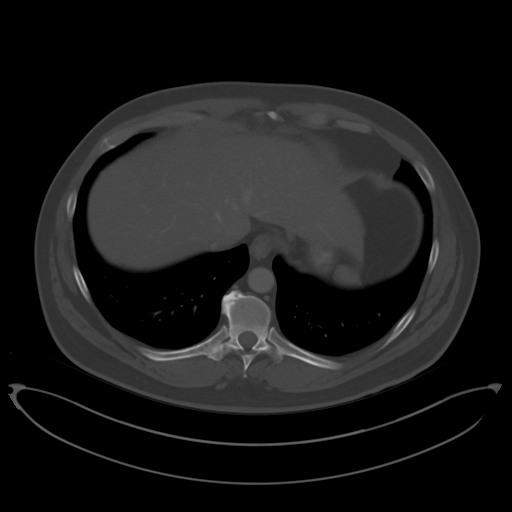
[im 110/143  soft-tissue]
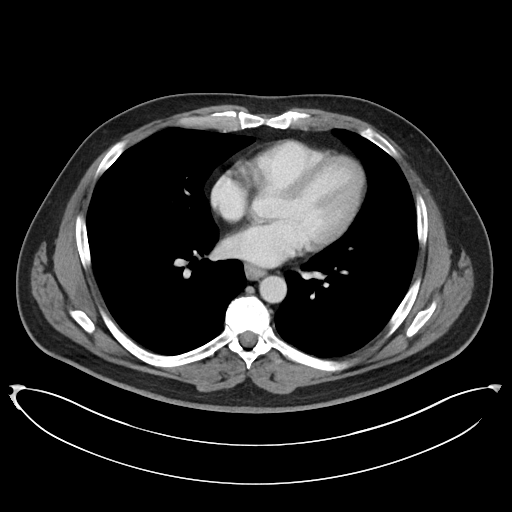
[im 121/143  soft-tissue]
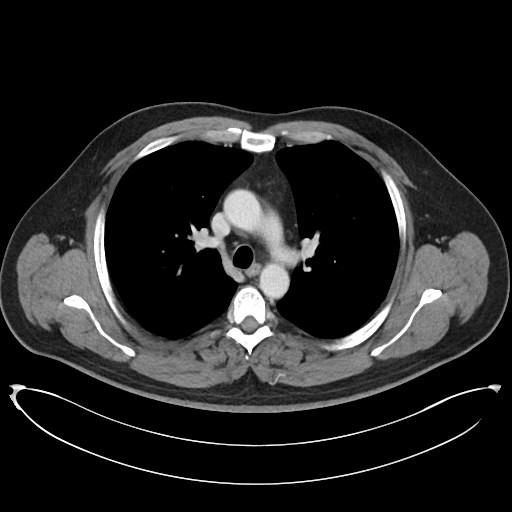
[im 132/143  soft-tissue]
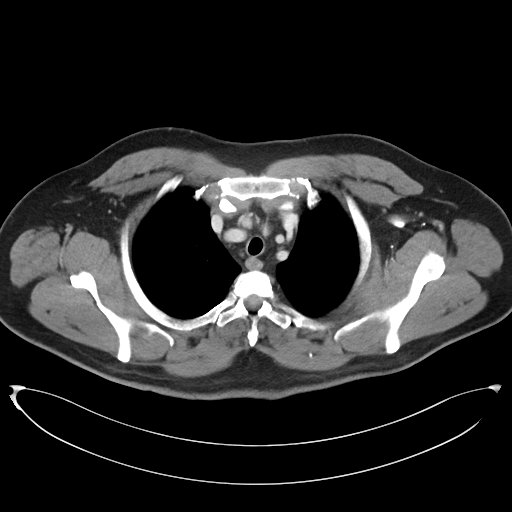

[Series 5: coronal · coronal · 0.94mm/px · 3 of 107 slices shown]
[im 36/107  soft-tissue]
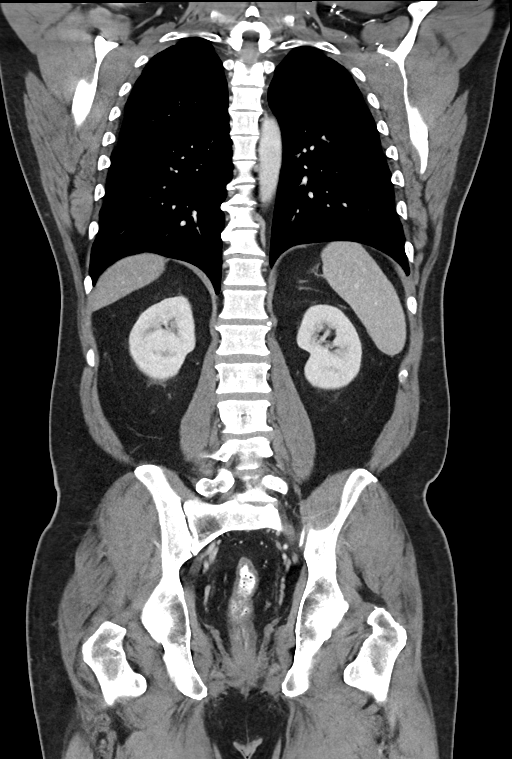
[im 48/107  soft-tissue]
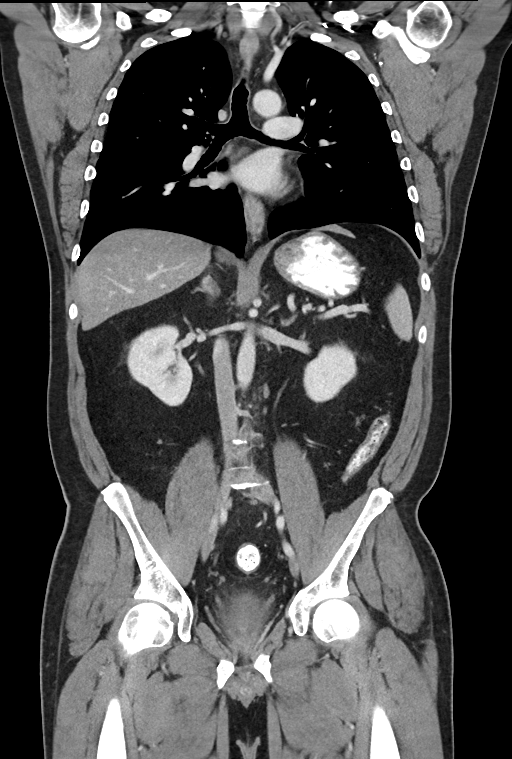
[im 59/107  soft-tissue]
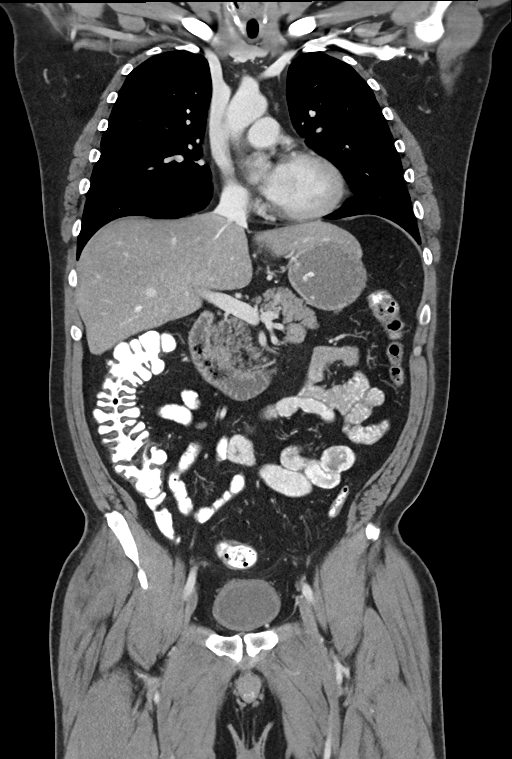

[15 of 46 positions shown; findings below may reference images not displayed]

RADIATION DOSE REDUCTION: This exam was performed according to the
departmental dose-optimization program which includes automated
exposure control, adjustment of the mA and/or kV according to
patient size and/or use of iterative reconstruction technique.

CONTRAST:  100mL OMNIPAQUE IOHEXOL 300 MG/ML  SOLN
FINDINGS: CT CHEST FINDINGS

Cardiovascular: No acute findings.

Mediastinum/Lymph Nodes: No masses or pathologically enlarged lymph
nodes identified.

Lungs/Pleura: No suspicious pulmonary nodules or masses identified.
No evidence of infiltrate or pleural effusion.

Musculoskeletal:  No suspicious bone lesions identified.

CT ABDOMEN AND PELVIS FINDINGS

Hepatobiliary: No masses identified. Mild diffuse hepatic steatosis
again demonstrated. Gallbladder is unremarkable. No evidence of
biliary ductal dilatation.

Pancreas:  No mass or inflammatory changes.

Spleen:  Within normal limits in size and appearance.

Adrenals/Urinary tract:  No masses or hydronephrosis.

Stomach/Bowel: No evidence of obstruction, inflammatory process, or
abnormal fluid collections. Normal appendix visualized. No soft
tissue masses identified.

Vascular/Lymphatic: No pathologically enlarged lymph nodes
identified. No acute vascular findings.

Reproductive:  No mass or other significant abnormality identified.

Other:  None.

Musculoskeletal: No suspicious bone lesions identified. Bilateral L5
pars defects again seen, without significant spondylolisthesis.
IMPRESSION: Stable exam. No evidence of recurrent or metastatic carcinoma within
the chest, abdomen, or pelvis.

Stable mild hepatic steatosis.

## 2023-09-29 ENCOUNTER — Ambulatory Visit: Payer: No Typology Code available for payment source | Admitting: Family Medicine

## 2023-10-03 ENCOUNTER — Ambulatory Visit: Payer: No Typology Code available for payment source | Admitting: Family Medicine

## 2023-10-03 ENCOUNTER — Encounter: Payer: Self-pay | Admitting: Family Medicine

## 2023-10-03 VITALS — BP 137/89 | HR 81 | Temp 97.0°F | Ht 69.0 in | Wt 206.5 lb

## 2023-10-03 DIAGNOSIS — L723 Sebaceous cyst: Secondary | ICD-10-CM

## 2023-10-03 DIAGNOSIS — L089 Local infection of the skin and subcutaneous tissue, unspecified: Secondary | ICD-10-CM | POA: Diagnosis not present

## 2023-10-03 MED ORDER — DOXYCYCLINE HYCLATE 100 MG PO TABS
100.0000 mg | ORAL_TABLET | Freq: Two times a day (BID) | ORAL | 0 refills | Status: AC
Start: 2023-10-03 — End: ?

## 2023-10-03 NOTE — Patient Instructions (Addendum)
 It was very nice to see you today!  You had an infected cyst.  We drained this today and remove this as well.  Start the doxycycline.  Let us know if your symptoms do not continue to improve over the next several days.  Return if symptoms worsen or fail to improve.   Take care, Dr [name]  PLEASE NOTE:  If you had any lab tests, please let us know if you have not heard back within a few days. You may see your results on mychart before we have a chance to review them but we will give you a call once they are reviewed by [place].   If we ordered any referrals today, please let us know if you have not heard from their office within the next week.   If you had any urgent prescriptions sent in today, please check with the pharmacy within an hour of our visit to make sure the prescription was transmitted appropriately.   Please try these tips to maintain a healthy lifestyle:  Eat at least 3 REAL meals and 1-2 snacks per day.  Aim for no more than 5 hours between eating.  If you eat breakfast, please do so within one hour of getting up.   Each meal should contain half fruits/vegetables, one quarter protein, and one quarter carbs (no bigger than a computer mouse)  Cut down on sweet beverages. This includes juice, soda, and sweet tea.   Drink at least 1 glass of water with each meal and aim for at least 8 glasses per day  Exercise at least 150 minutes every week.

## 2023-10-03 NOTE — Progress Notes (Signed)
   Jeffrey Day. is a 48 y.o. male who presents today for an office visit.  Assessment/Plan:  Inflamed sebaceous cyst I&D performed today.  Cyst wall was removed as well.  See below procedure note.  We will start doxycycline.  No signs of systemic illness.  We discussed care after and reasons return to care.  Follow-up as needed.     Subjective:  HPI:  Patient with here with painful nodule right neck.  This has been present for quite a while though has gotten worse the last couple of weeks.  More redness.  More pain.  A lot more swollen.  No treatments tried.      Objective:  Physical Exam: BP 137/89   Pulse 81   Temp (!) 97 F (36.1 C) (Temporal)   Ht 5\' 9"  (1.753 m)   Wt 206 lb 8 oz (93.7 kg)   SpO2 99%   BMI 30.49 kg/m   Gen: No acute distress, resting comfortably Skin: Approximately 2 to 3 cm fluctuant erythematous mass on right posterior neck. Neuro: Grossly normal, moves all extremities Psych: Normal affect and thought content  Sebaceous Cyst I&D with Excision Procedure Note  Pre-operative Diagnosis: Inflamed sebaceous cyst  Post-operative Diagnosis: Same  Locations: Right Posterior neck  Indications: Diagnostic and therapeutic  Anesthesia: Lidocaine 1% with epinephrine without added sodium bicarbonate  Procedure Details  History of allergy to iodine: No  Patient informed of the risks (including bleeding and infection) and benefits of the  procedure and verbal informed consent obtained.  The lesion and surrounding area was given a sterile prep using betadyne and draped in the usual sterile fashion.  A 4 mm punch biopsy was used to penetrate the lesion.  Purulent material and typical sebaceous material was then expressed from the incision site.  After successful expression of material, a hemostat was then inserted into the incision to break up any loculations. A hemostat was then used to gently remove the cyst wall from the incision.  Antibiotic  ointment and a sterile dressing applied.  The specimen was not sent for pathologic examination. The patient tolerated the procedure well.  EBL: 3 ml  Findings: Inflamed sebaceous cyst  Condition: Stable  Complications: none.  Plan: 1. Instructed to keep the wound dry and covered for 24-48h and clean thereafter. 2. Warning signs of infection were reviewed.   3. Recommended that the patient use OTC analgesics as needed for pain.          [name] Jeffrey Ralph, MD 10/03/2023 7:34 AM

## 2024-06-11 ENCOUNTER — Encounter: Payer: Self-pay | Admitting: Radiology
